# Patient Record
Sex: Female | Born: 1975 | Race: White | Hispanic: No | Marital: Married | Smoking: Former smoker
Health system: Southern US, Community
[De-identification: ages and names within clinical notes are randomized; demographics above are authoritative.]

## PROBLEM LIST (undated history)

## (undated) DIAGNOSIS — R200 Anesthesia of skin: Secondary | ICD-10-CM

## (undated) DIAGNOSIS — K2970 Gastritis, unspecified, without bleeding: Secondary | ICD-10-CM

## (undated) DIAGNOSIS — T7840XA Allergy, unspecified, initial encounter: Secondary | ICD-10-CM

## (undated) DIAGNOSIS — K900 Celiac disease: Secondary | ICD-10-CM

## (undated) DIAGNOSIS — C4491 Basal cell carcinoma of skin, unspecified: Secondary | ICD-10-CM

## (undated) DIAGNOSIS — K30 Functional dyspepsia: Secondary | ICD-10-CM

## (undated) DIAGNOSIS — E079 Disorder of thyroid, unspecified: Secondary | ICD-10-CM

## (undated) DIAGNOSIS — G610 Guillain-Barre syndrome: Secondary | ICD-10-CM

## (undated) HISTORY — PX: ESOPHAGOGASTRODUODENOSCOPY: SHX1529

## (undated) HISTORY — PX: WISDOM TOOTH EXTRACTION: SHX21

## (undated) HISTORY — PX: SKIN CANCER EXCISION: SHX779

## (undated) HISTORY — DX: Guillain-Barre syndrome: G61.0

## (undated) HISTORY — DX: Functional dyspepsia: K30

## (undated) HISTORY — DX: Gastritis, unspecified, without bleeding: K29.70

## (undated) HISTORY — DX: Celiac disease: K90.0

## (undated) HISTORY — DX: Anesthesia of skin: R20.0

## (undated) HISTORY — DX: Allergy, unspecified, initial encounter: T78.40XA

## (undated) HISTORY — DX: Basal cell carcinoma of skin, unspecified: C44.91

## (undated) HISTORY — DX: Disorder of thyroid, unspecified: E07.9

---

## 2001-01-31 ENCOUNTER — Other Ambulatory Visit: Admission: RE | Admit: 2001-01-31 | Discharge: 2001-01-31 | Payer: Self-pay | Admitting: Obstetrics & Gynecology

## 2002-02-22 ENCOUNTER — Other Ambulatory Visit: Admission: RE | Admit: 2002-02-22 | Discharge: 2002-02-22 | Payer: Self-pay | Admitting: Obstetrics & Gynecology

## 2003-02-26 ENCOUNTER — Other Ambulatory Visit: Admission: RE | Admit: 2003-02-26 | Discharge: 2003-02-26 | Payer: Self-pay | Admitting: Obstetrics & Gynecology

## 2004-03-10 ENCOUNTER — Inpatient Hospital Stay (HOSPITAL_COMMUNITY): Admission: AD | Admit: 2004-03-10 | Discharge: 2004-03-13 | Payer: Self-pay | Admitting: Obstetrics and Gynecology

## 2004-03-10 ENCOUNTER — Inpatient Hospital Stay (HOSPITAL_COMMUNITY): Admission: AD | Admit: 2004-03-10 | Discharge: 2004-03-10 | Payer: Self-pay | Admitting: Obstetrics & Gynecology

## 2004-04-22 ENCOUNTER — Encounter: Admission: RE | Admit: 2004-04-22 | Discharge: 2004-05-22 | Payer: Self-pay | Admitting: Obstetrics & Gynecology

## 2004-06-26 ENCOUNTER — Encounter: Admission: RE | Admit: 2004-06-26 | Discharge: 2004-06-26 | Payer: Self-pay | Admitting: Family Medicine

## 2006-04-05 ENCOUNTER — Emergency Department (HOSPITAL_COMMUNITY): Admission: EM | Admit: 2006-04-05 | Discharge: 2006-04-05 | Payer: Self-pay | Admitting: Emergency Medicine

## 2006-04-09 ENCOUNTER — Ambulatory Visit: Payer: Self-pay | Admitting: Family Medicine

## 2006-04-27 ENCOUNTER — Ambulatory Visit: Payer: Self-pay | Admitting: Cardiovascular Disease

## 2006-04-28 ENCOUNTER — Encounter: Admission: RE | Admit: 2006-04-28 | Discharge: 2006-07-27 | Payer: Self-pay | Admitting: Family Medicine

## 2006-05-05 ENCOUNTER — Ambulatory Visit: Payer: Self-pay | Admitting: Family Medicine

## 2006-06-02 ENCOUNTER — Ambulatory Visit: Payer: Self-pay | Admitting: Internal Medicine

## 2006-10-27 ENCOUNTER — Inpatient Hospital Stay (HOSPITAL_COMMUNITY): Admission: AD | Admit: 2006-10-27 | Discharge: 2006-10-29 | Payer: Self-pay | Admitting: *Deleted

## 2007-02-17 DIAGNOSIS — K900 Celiac disease: Secondary | ICD-10-CM

## 2007-02-17 HISTORY — DX: Celiac disease: K90.0

## 2007-04-01 ENCOUNTER — Ambulatory Visit: Payer: Self-pay | Admitting: Family Medicine

## 2007-04-01 ENCOUNTER — Encounter (INDEPENDENT_AMBULATORY_CARE_PROVIDER_SITE_OTHER): Payer: Self-pay | Admitting: *Deleted

## 2007-04-01 DIAGNOSIS — G610 Guillain-Barre syndrome: Secondary | ICD-10-CM

## 2007-04-01 DIAGNOSIS — E0590 Thyrotoxicosis, unspecified without thyrotoxic crisis or storm: Secondary | ICD-10-CM | POA: Insufficient documentation

## 2007-04-01 DIAGNOSIS — Z8669 Personal history of other diseases of the nervous system and sense organs: Secondary | ICD-10-CM

## 2007-04-01 HISTORY — DX: Thyrotoxicosis, unspecified without thyrotoxic crisis or storm: E05.90

## 2007-04-01 HISTORY — DX: Guillain-Barre syndrome: G61.0

## 2007-04-01 LAB — CONVERTED CEMR LAB
ALT: 29 units/L (ref 0–35)
Alkaline Phosphatase: 47 units/L (ref 39–117)
BUN: 9 mg/dL (ref 6–23)
Basophils Absolute: 0.1 10*3/uL (ref 0.0–0.1)
Basophils Relative: 0.9 % (ref 0.0–1.0)
Bilirubin, Direct: 0.1 mg/dL (ref 0.0–0.3)
CO2: 28 meq/L (ref 19–32)
Chloride: 106 meq/L (ref 96–112)
Eosinophils Absolute: 0 10*3/uL (ref 0.0–0.6)
Eosinophils Relative: 0.8 % (ref 0.0–5.0)
Free T4: 0.7 ng/dL (ref 0.6–1.6)
Glucose, Bld: 63 mg/dL — ABNORMAL LOW (ref 70–99)
HCT: 43.5 % (ref 36.0–46.0)
Lymphocytes Relative: 39.2 % (ref 12.0–46.0)
Monocytes Relative: 5.9 % (ref 3.0–11.0)
Platelets: 315 10*3/uL (ref 150–400)
RDW: 12.3 % (ref 11.5–14.6)
T3, Free: 2.9 pg/mL (ref 2.3–4.2)
TSH: 2.38 microintl units/mL (ref 0.35–5.50)
WBC: 5.8 10*3/uL (ref 4.5–10.5)

## 2007-04-04 ENCOUNTER — Encounter (INDEPENDENT_AMBULATORY_CARE_PROVIDER_SITE_OTHER): Payer: Self-pay | Admitting: Family Medicine

## 2007-04-05 ENCOUNTER — Telehealth (INDEPENDENT_AMBULATORY_CARE_PROVIDER_SITE_OTHER): Payer: Self-pay | Admitting: *Deleted

## 2007-04-06 ENCOUNTER — Encounter (INDEPENDENT_AMBULATORY_CARE_PROVIDER_SITE_OTHER): Payer: Self-pay | Admitting: *Deleted

## 2007-04-07 ENCOUNTER — Encounter (INDEPENDENT_AMBULATORY_CARE_PROVIDER_SITE_OTHER): Payer: Self-pay | Admitting: *Deleted

## 2007-04-28 ENCOUNTER — Ambulatory Visit: Payer: Self-pay | Admitting: Internal Medicine

## 2007-04-28 LAB — CONVERTED CEMR LAB: IgA: 239 mg/dL (ref 68–378)

## 2007-05-12 ENCOUNTER — Encounter: Payer: Self-pay | Admitting: Internal Medicine

## 2007-05-12 ENCOUNTER — Ambulatory Visit: Payer: Self-pay | Admitting: Internal Medicine

## 2007-05-12 ENCOUNTER — Encounter: Payer: Self-pay | Admitting: Family Medicine

## 2007-05-12 HISTORY — PX: COLONOSCOPY: SHX174

## 2007-10-26 DIAGNOSIS — K900 Celiac disease: Secondary | ICD-10-CM

## 2007-10-26 DIAGNOSIS — E162 Hypoglycemia, unspecified: Secondary | ICD-10-CM

## 2007-10-26 HISTORY — DX: Hypoglycemia, unspecified: E16.2

## 2007-10-27 ENCOUNTER — Ambulatory Visit: Payer: Self-pay | Admitting: Internal Medicine

## 2007-10-28 LAB — CONVERTED CEMR LAB
Albumin: 4.3 g/dL (ref 3.5–5.2)
Basophils Absolute: 0.1 10*3/uL (ref 0.0–0.1)
Basophils Relative: 1.1 % (ref 0.0–3.0)
CO2: 27 meq/L (ref 19–32)
Chloride: 102 meq/L (ref 96–112)
Eosinophils Absolute: 0.1 10*3/uL (ref 0.0–0.7)
Eosinophils Relative: 0.8 % (ref 0.0–5.0)
GFR calc Af Amer: 126 mL/min
HCT: 39 % (ref 36.0–46.0)
Lymphocytes Relative: 32.8 % (ref 12.0–46.0)
Monocytes Absolute: 0.5 10*3/uL (ref 0.1–1.0)
Monocytes Relative: 6.5 % (ref 3.0–12.0)
Platelets: 391 10*3/uL (ref 150–400)
Potassium: 4 meq/L (ref 3.5–5.1)
RDW: 12.3 % (ref 11.5–14.6)
Total Bilirubin: 0.7 mg/dL (ref 0.3–1.2)
WBC: 8.3 10*3/uL (ref 4.5–10.5)

## 2008-04-25 ENCOUNTER — Ambulatory Visit: Payer: Self-pay | Admitting: Internal Medicine

## 2008-05-03 LAB — CONVERTED CEMR LAB
ALT: 13 units/L (ref 0–35)
Basophils Absolute: 0.1 10*3/uL (ref 0.0–0.1)
CO2: 30 meq/L (ref 19–32)
Calcium: 9.4 mg/dL (ref 8.4–10.5)
Chloride: 106 meq/L (ref 96–112)
Eosinophils Absolute: 0.1 10*3/uL (ref 0.0–0.7)
Ferritin: 11.1 ng/mL (ref 10.0–291.0)
Folate: >20 ng/mL
GFR calc Af Amer: 126 mL/min
GFR calc non Af Amer: 104 mL/min
HCT: 40.9 % (ref 36.0–46.0)
Hemoglobin: 13.8 g/dL (ref 12.0–15.0)
Lymphocytes Relative: 37.2 % (ref 12.0–46.0)
Monocytes Absolute: 0.3 10*3/uL (ref 0.1–1.0)
Phosphorus: 3 mg/dL (ref 2.3–4.6)
Potassium: 4.3 meq/L (ref 3.5–5.1)
RBC: 4.6 M/uL (ref 3.87–5.11)
Sodium: 141 meq/L (ref 135–145)
TSH: 1.89 microintl units/mL (ref 0.35–5.50)
Vitamin B-12: 710 pg/mL (ref 211–911)

## 2008-05-11 ENCOUNTER — Encounter: Payer: Self-pay | Admitting: Internal Medicine

## 2008-05-11 ENCOUNTER — Ambulatory Visit: Payer: Self-pay | Admitting: Family Medicine

## 2008-05-21 LAB — CONVERTED CEMR LAB: Vit D, 25-Hydroxy: 41 ng/mL (ref 30–89)

## 2008-05-25 ENCOUNTER — Encounter: Payer: Self-pay | Admitting: Internal Medicine

## 2008-05-25 ENCOUNTER — Ambulatory Visit: Payer: Self-pay | Admitting: Internal Medicine

## 2008-06-05 ENCOUNTER — Ambulatory Visit: Payer: Self-pay | Admitting: Family Medicine

## 2008-06-05 DIAGNOSIS — J020 Streptococcal pharyngitis: Secondary | ICD-10-CM

## 2009-01-30 LAB — CONVERTED CEMR LAB: Pap Smear: NORMAL

## 2009-02-11 ENCOUNTER — Encounter: Payer: Self-pay | Admitting: Family Medicine

## 2009-02-11 LAB — CONVERTED CEMR LAB: HDL: 66 mg/dL

## 2009-05-22 ENCOUNTER — Ambulatory Visit: Payer: Self-pay | Admitting: Family Medicine

## 2009-05-22 DIAGNOSIS — R142 Eructation: Secondary | ICD-10-CM

## 2009-05-22 DIAGNOSIS — F438 Other reactions to severe stress: Secondary | ICD-10-CM

## 2009-05-22 DIAGNOSIS — R143 Flatulence: Secondary | ICD-10-CM

## 2009-05-22 DIAGNOSIS — R141 Gas pain: Secondary | ICD-10-CM | POA: Insufficient documentation

## 2009-05-24 ENCOUNTER — Telehealth (INDEPENDENT_AMBULATORY_CARE_PROVIDER_SITE_OTHER): Payer: Self-pay | Admitting: *Deleted

## 2009-05-24 LAB — CONVERTED CEMR LAB
AST: 23 units/L (ref 0–37)
Albumin: 4.4 g/dL (ref 3.5–5.2)
Alkaline Phosphatase: 42 units/L (ref 39–117)
BUN: 9 mg/dL (ref 6–23)
Basophils Relative: 1.3 % (ref 0.0–3.0)
Bilirubin, Direct: 0.1 mg/dL (ref 0.0–0.3)
Hemoglobin: 14.4 g/dL (ref 12.0–15.0)
Lymphs Abs: 1.4 10*3/uL (ref 0.7–4.0)
MCHC: 34.8 g/dL (ref 30.0–36.0)
Monocytes Absolute: 0.3 10*3/uL (ref 0.1–1.0)
Neutro Abs: 1.9 10*3/uL (ref 1.4–7.7)
Neutrophils Relative %: 51.7 % (ref 43.0–77.0)
Platelets: 262 10*3/uL (ref 150.0–400.0)
RBC: 4.59 M/uL (ref 3.87–5.11)
Total Bilirubin: 0.6 mg/dL (ref 0.3–1.2)
WBC: 3.7 10*3/uL — ABNORMAL LOW (ref 4.5–10.5)

## 2009-05-27 ENCOUNTER — Encounter: Payer: Self-pay | Admitting: Internal Medicine

## 2009-05-27 LAB — CONVERTED CEMR LAB
IgE (Immunoglobulin E), Serum: 9.8 intl units/mL (ref 0.0–180.0)
Tissue Transglutaminase Ab, IgA: 0.9 units (ref <7)

## 2009-05-29 ENCOUNTER — Ambulatory Visit: Payer: Self-pay | Admitting: *Deleted

## 2009-06-05 ENCOUNTER — Ambulatory Visit: Payer: Self-pay | Admitting: *Deleted

## 2009-06-28 ENCOUNTER — Encounter: Payer: Self-pay | Admitting: Family Medicine

## 2009-06-28 ENCOUNTER — Ambulatory Visit: Payer: Self-pay | Admitting: *Deleted

## 2009-07-05 ENCOUNTER — Encounter (INDEPENDENT_AMBULATORY_CARE_PROVIDER_SITE_OTHER): Payer: Self-pay | Admitting: *Deleted

## 2009-07-19 ENCOUNTER — Ambulatory Visit: Payer: Self-pay | Admitting: *Deleted

## 2009-08-22 ENCOUNTER — Ambulatory Visit: Payer: Self-pay | Admitting: Internal Medicine

## 2009-09-27 ENCOUNTER — Telehealth: Payer: Self-pay | Admitting: Internal Medicine

## 2010-03-18 NOTE — Letter (Signed)
Summary: Letter to Patient Regarding Lab Results/[facility]  Letter to Patient Regarding Lab Results/[facility]   Imported By: Edmonia James 05/27/2009 08:34:44  _____________________________________________________________________  External Attachment:    Type:   Image     Comment:   External Document

## 2010-03-18 NOTE — Progress Notes (Signed)
Summary: Lab results   Phone Note Outgoing Call   Call placed by: Allyn Kenner CMA,  May 24, 2009 4:44 PM Summary of Call: Regarding lab results, LMTCB:  allergic to [facility] grass  celiac test normal Signed by [name] DO on 05/24/2009 at 9:39 AM  Follow-up for Phone Call        [facility]. Allyn Kenner CMA  May 27, 2009 11:59 AM   Additional Follow-up for Phone Call Additional follow up Details #1::        Pt is aware. [place]  May 27, 2009 2:09 PM

## 2010-03-18 NOTE — Assessment & Plan Note (Signed)
Summary: REC OV...AS.    History of Present Illness Visit Type: Follow-up Visit Primary GI MD: Silvano Rusk MD [facility] Primary Provider: [name] DO Requesting Provider: n/a Chief Complaint: bloating & gas History of Present Illness:   35 yo woman with celiac disease diagnosed in 2009. TTG Ab was 30. She has been gluten-free with normal TTG Ab leveles since. [place], despite resolution of diarrhea, still bothered by gas, some bloating and borborygmi. She took a trip to [place] and was not bothered while there. She ?'s if food intolerance. Has reduced milk and probably a little better. Feels gas bubbles movng through, not painful. Slight sensation of swelling.    GI Review of Systems    Reports bloating.      Denies abdominal pain, acid reflux, belching, chest pain, dysphagia with liquids, dysphagia with solids, heartburn, loss of appetite, nausea, vomiting, vomiting blood, weight loss, and  weight gain.        Denies anal fissure, black tarry stools, change in bowel habit, constipation, diarrhea, diverticulosis, fecal incontinence, heme positive stool, hemorrhoids, irritable bowel syndrome, jaundice, light color stool, liver problems, rectal bleeding, and  rectal pain. Clinical Reports Reviewed:  Colonoscopy:  05/12/2007:  1) SMALL EXTERNAL HEMORRHOIDS 2) OTHERWSIE NORMAL EXAM INTO TERMINAL ILEUM 3) RANDOM COLON BIOPSIES TO LOOK FOR MICROSCOPIC COLITIS 4) EXCELLENT PREP  COLON, BIOPSY:  BENIGN COLONIC MUCOSA.  NO SIGNIFICANT INFLAMMATION OR OTHER ABNORMALITIES IDENTIFIED.     Upper Endoscopy:  05/12/2007:  1) MILD FISSURING AT TOP OF DUODENAL FOLDS WHICH COULD BE A SIGN OF CELIAC DISEASE. BIOPSIES TAKEN. 2) OTHERWISE NORMAL EGD  1.  DUODENUM, BIOPSY:  FINDINGS CONSISTENT WITH SUBTOTAL VILLOUS ATROPHY AND ASSOCIATED INCREASED INTRAEPITHELIAL LYMPHOCYTES.      Current Medications (verified): 1)  Tylenol 325 Mg Tabs (Acetaminophen) .... As Needed 2)  Claritin 10 Mg Tabs  (Loratadine) .... As Needed 3)  Iron .Marland KitchenMarland Kitchen. 1 A Day 4)  Vitamin D .... 1 A Day 5)  Calcium .Marland KitchenMarland Kitchen. 1 A Day 6)  Vitamin A .Marland Kitchen.. 1 A Day 7)  Epiduo .Marland Kitchen.. 1 Two Times A Day For Acne 8)  Tagamet Hb 200 Mg Tabs (Cimetidine) .... Two Times A Day  Allergies (verified): 1)  ! Fluvirin (Influenza Vac Typ A&b Surf Ant)  Past History:  Past Medical History: Reviewed history from 10/27/2007 and no changes required. Hyperthyroidism- post-partum Seasonal Allergies Celiac sprue  Past Surgical History: Reviewed history from 10/27/2007 and no changes required. Cesarean section  Family History: Reviewed history from 10/27/2007 and no changes required. mother-leukemia father-prostate cancer siblings-HTN No FH of Colon Cancer:  Social History: Reviewed history from 10/27/2007 and no changes required. 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  Alcohol Use - yes -occ. Daily Caffeine Use -1 Illicit Drug Use - no Patient does not get regular exercise.   Vital Signs:  Patient profile:   35 year old female Height:      66 inches Weight:      131 pounds BMI:     21.22 Pulse rate:   64 / minute Pulse rhythm:   regular BP sitting:   108 / 70  (left arm) Cuff size:   regular  Vitals Entered By: June McMurray [place] Deborra Medina) (August 22, 2009 3:58 PM)  Physical Exam  General:  Well developed, well nourished, no acute distress.   Impression & Recommendations:  Problem # 1:  FLATULENCE ERUCTATION AND GAS PAIN (ICD-787.3) Assessment New She noticed it  as a problems when she traveled to [place] in 12/2008. Her celiac disease seems to be controlled by TTG Ab levels so ? IBS, food intolerance, small intestinal bacterial overgrowth? Will try to elimnate fructose and see what that does. Will see what I can find out about celaiac disease and whether we should repeat an EGD despite normal TTG Ab.  It seesm to me (and her) that something was not in  diet when she was in [place], making the possibility of celiac disease as the problem less so.  Problem # 2:  CELIAC SPRUE (ICD-579.0) Assessment: Unchanged TTG Ab's ok has not had a repeat EGD to see if histologic resolution achieved ? if she needs will investigate  Patient Instructions: 1)  Follow the instructions to eliminate fructose in your diet. 2)  Let us know if this helps after 4 weeks have passed. 3)  Dr. Carlean Purl will also investigate the possibility that celiac disease is still a problem despite normal antibody levels and let you know. 4)  The medication list was reviewed and reconciled.  All changed / newly prescribed medications were explained.  A complete medication list was provided to the patient / caregiver.

## 2010-03-18 NOTE — Assessment & Plan Note (Signed)
Summary: CPX AND FASTING LABS///[facility]   Vital Signs:  Patient profile:   35 year old female Height:      66 inches Weight:      128.50 pounds BMI:     20.82 Pulse rate:   68 / minute BP sitting:   100 / 78  Vitals Entered By: Verdie Mosher (May 22, 2009 8:32 AM) CC: cpx without pap Comments pt had pap and lipid labs at gyn office pt bought in labs   History of Present Illness: Pt here for cpe and labs.  Pt sees Dr Garwin Brothers for gyn exam and Dr Carlean Purl for Celiac dz.  Pt thinks she may also be allergic to milk--- lactose intolerant---she has alot of gas and bloating.  When she went to [place] she had no issues.   Pt husband also complains pt is getting angry easily and has a lot of stress.  Pt is asking to see a counselor and prefers not to be put on meds at this time.    Preventive Screening-Counseling & Management  Alcohol-Tobacco     Alcohol drinks/day: <1     Alcohol type: wine     Smoking Status: quit     Smoke Cessation Stage: quit     Packs/Day: <0.25     Year Started: 1994     Year Quit: 2000  Caffeine-Diet-Exercise     Caffeine use/day: 2     Does Patient Exercise: yes     Type of exercise: gym     Times/week: 3     Exercise Counseling: not indicated; exercise is adequate  Hep-HIV-STD-Contraception     Dental Visit-last 6 months yes     Dental Care Counseling: not indicated; dental care within six months     SBE monthly: no     SBE Education/Counseling: to perform regular SBE      Sexual History:  currently monogamous and married with 2 children.    Current Medications (verified): 1)  Tylenol 325 Mg Tabs (Acetaminophen) .... As Needed 2)  Claritin 10 Mg Tabs (Loratadine) .... As Needed 3)  Iron .Marland KitchenMarland Kitchen. 1 A Day 4)  Vitamin D .... 1 A Day 5)  Calcium .Marland KitchenMarland Kitchen. 1 A Day 6)  Vitamin A .Marland Kitchen.. 1 A Day 7)  Minocycline .Marland Kitchen.. 1 Bid 8)  Epiduo .Marland Kitchen.. 1 Two Times A Day For Acne  Allergies (verified): 1)  ! 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  Alcohol Use - yes -occ. Daily Caffeine Use -1 Illicit Drug Use - no Patient does not get regular exercise.   Risk Factors: Alcohol Use: <1 (05/22/2009) Caffeine Use: 2 (05/22/2009) Exercise: yes (05/22/2009)  Risk Factors: Smoking Status: quit (05/22/2009) Packs/Day: <0.25 (05/22/2009)  Family History: Reviewed history from 10/27/2007 and no changes required. mother-leukemia father-prostate cancer siblings-HTN No FH of Colon Cancer:  Social History: Reviewed history from 10/27/2007 and no changes required. 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  Alcohol Use - yes -occ. Daily Caffeine Use -1 Illicit Drug Use - no Patient does not get regular exercise.  Packs/Day:  <0.25 Caffeine use/day:  2  Does Patient Exercise:  yes Dental Care w/in 6 mos.:  yes Sexual History:  currently monogamous, married with 2 children   Review of Systems      See HPI General:  Denies chills, fatigue, fever, loss of appetite, malaise, sleep disorder, sweats, weakness, and weight loss. Eyes:  Denies blurring, discharge, double vision, eye irritation, eye pain, halos, itching, light sensitivity, red eye, vision loss-1 eye, and vision loss-both eyes; optho-q2y. ENT:  Denies decreased hearing, difficulty swallowing, ear discharge, earache, hoarseness, nasal congestion, nosebleeds, postnasal drainage, ringing in ears, sinus pressure, and sore throat. CV:  Denies bluish discoloration of lips or nails, chest  pain or discomfort, difficulty breathing at night, difficulty breathing while lying down, fainting, fatigue, leg cramps with exertion, lightheadness, near fainting, palpitations, shortness of breath with exertion, swelling of feet, swelling of hands, and weight gain. Resp:  Denies chest discomfort, chest pain with inspiration, cough, coughing up blood, excessive snoring, hypersomnolence, morning headaches, pleuritic, shortness of breath, sputum productive, and wheezing. GI:  Denies abdominal pain, bloody stools, change in bowel habits, constipation, dark tarry stools, diarrhea, excessive appetite, gas, hemorrhoids, indigestion, loss of appetite, nausea, vomiting, vomiting blood, and yellowish skin color. GU:  Denies abnormal vaginal bleeding, decreased libido, discharge, dysuria, genital sores, hematuria, incontinence, nocturia, urinary frequency, and urinary hesitancy. MS:  Denies joint pain, joint redness, joint swelling, loss of strength, low back pain, mid back pain, muscle aches, muscle , cramps, muscle weakness, stiffness, and thoracic pain. Derm:  Denies changes in color of skin, changes in nail beds, dryness, excessive perspiration, flushing, hair loss, insect bite(s), itching, lesion(s), poor wound healing, and rash. Neuro:  Denies brief paralysis, difficulty with concentration, disturbances in coordination, falling down, headaches, inability to speak, memory loss, numbness, poor balance, seizures, sensation of room spinning, tingling, tremors, visual disturbances, and weakness. Psych:  Denies alternate hallucination ( auditory/visual), anxiety, depression, easily angered, easily tearful, irritability, mental problems, panic attacks, sense of great danger, suicidal thoughts/plans, thoughts of violence, unusual visions or sounds, and thoughts /plans of harming others. Endo:  Denies cold intolerance, excessive hunger, excessive thirst, excessive urination, heat intolerance, polyuria, and weight  change. Heme:  Denies abnormal bruising, bleeding, enlarge lymph nodes, fevers, pallor, and skin discoloration. Allergy:  Denies hives or rash, itching eyes, persistent infections, seasonal allergies, and sneezing.  Physical Exam  General:  Well-developed,well-nourished,in no acute distress; alert,appropriate and cooperative throughout examination Head:  Normocephalic and atraumatic without obvious abnormalities. No apparent alopecia or balding. Eyes:  vision grossly intact, pupils equal, pupils round, pupils reactive to light, and no injection.   Ears:  External ear exam shows no significant lesions or deformities.  Otoscopic examination reveals clear canals, tympanic membranes are intact bilaterally without bulging, retraction, inflammation or discharge. Hearing is grossly normal bilaterally. Nose:  External nasal examination shows no deformity or inflammation. Nasal mucosa are pink and moist without lesions or exudates. Mouth:  Oral mucosa and oropharynx without lesions or exudates.  Teeth in good repair. Neck:  No deformities, masses, or tenderness noted. Breasts:  gyn Lungs:  Normal respiratory effort, chest expands symmetrically. Lungs are clear to auscultation, no crackles or wheezes. Heart:  normal rate and no murmur.   Abdomen:  Bowel sounds positive,abdomen soft and non-tender without masses, organomegaly or hernias noted. Genitalia:  gyn Msk:  normal ROM, no joint tenderness, no joint swelling, no joint warmth, no redness over joints, no joint deformities, no joint instability, and no crepitation.   Pulses:  R and L carotid,radial,femoral,dorsalis pedis and  posterior tibial pulses are full and equal bilaterally Extremities:  No clubbing, cyanosis, edema, or deformity noted with normal full range of motion of all joints.   Neurologic:  No cranial nerve deficits noted. Station and gait are normal. Plantar reflexes are down-going bilaterally. DTRs are symmetrical throughout. Sensory,  motor and coordinative functions appear intact. Skin:  Intact without suspicious lesions or rashes Cervical Nodes:  No lymphadenopathy noted Axillary Nodes:  No palpable lymphadenopathy Psych:  Cognition and judgment appear intact. Alert and cooperative with normal attention span and concentration. No apparent delusions, illusions, hallucinations   Impression & Recommendations:  Problem # 1:  [place] (ICD-V70.0) ghm utd  Orders: Venipuncture (95621) TLB-BMP (Basic Metabolic Panel-BMET) (30865-HQIONGE) TLB-CBC Platelet - w/Differential (85025-CBCD) TLB-Hepatic/Liver Function Pnl (80076-HEPATIC) TLB-TSH (Thyroid Stimulating Hormone) (84443-TSH) TLB-T3, Free (Triiodothyronine) (84481-T3FREE) TLB-T4 (Thyrox), Free [phone]) T- * Misc. Laboratory test [phone])  Problem # 2:  FLATULENCE ERUCTATION AND GAS PAIN (ICD-787.3)  Orders: T-Food Allergy Profile Specific IgE (86003/[id number]) T-Allergy Profile Region II-DC, [place], [place], [place], [place] [phone])  Problem # 3:  CELIAC SPRUE (ICD-579.0)  Orders: Venipuncture (66440) TLB-BMP (Basic Metabolic Panel-BMET) (34742-VZDGLOV) TLB-CBC Platelet - w/Differential (85025-CBCD) TLB-Hepatic/Liver Function Pnl (80076-HEPATIC) TLB-TSH (Thyroid Stimulating Hormone) (84443-TSH) TLB-T3, Free (Triiodothyronine) (84481-T3FREE) TLB-T4 (Thyrox), Free [phone]) T- * Misc. Laboratory test [phone])  Problem # 4:  OTHER ACUTE REACTIONS TO STRESS (ICD-308.3)  Orders: Psychology Referral (Psychology)  Complete Medication List: 1)  Tylenol 325 Mg Tabs (Acetaminophen) .... As needed 2)  Claritin 10 Mg Tabs (Loratadine) .... As needed 3)  Iron  .Marland Kitchen.. 1 a day 4)  Vitamin D  .... 1 a day 5)  Calcium  .Marland Kitchen.. 1 a day 6)  Vitamin A  .Marland Kitchen.. 1 a day 7)  Minocycline  .Marland KitchenMarland Kitchen. 1 bid 8)  Epiduo  .Marland Kitchen.. 1 two times a day for acne  Other Orders: Pneumococcal Vaccine [phone]) Admin 1st Vaccine [phone]) Admin 1st Vaccine [facility]) [phone]) Tdap => 75yr IM  ((73220 Admin of Any Addtl Vaccine ((25427 Admin of Any Addtl Vaccine (State) ([phone]     Flu Vaccine Next Due:  Not Indicated HDL Result Date:  02/11/2009 HDL Result:  66 HDL Next Due:  1 yr LDL Result Date:  02/11/2009 LDL Result:  76 LDL Next Due:  1 yr PAP Result Date:  01/30/2009 PAP Result:  normal PAP Next Due:  1 yr   Orders Added: 1)  Venipuncture [36415] 2)  TLB-BMP (Basic Metabolic Panel-BMET) [[28315-VVOHYWV]3)  TLB-CBC Platelet - w/Differential [85025-CBCD] 4)  TLB-Hepatic/Liver Function Pnl [80076-HEPATIC] 5)  TLB-TSH (Thyroid Stimulating Hormone) [84443-TSH] 6)  TLB-T3, Free (Triiodothyronine) [[37106-Y6RSWN]7)  TLB-T4 (Thyrox), Free [[46270-JJ0K]8)  T- * Misc. Laboratory test [[93818] 2  T-Food Allergy Profile Specific IgE [86003/[address]] 10)  T-Allergy Profile Region [place], [place], [place], [place], [place][5484] 11)  Psychology Referral [Psychology] 12)  Pneumococcal Vaccine [90732] 13)  Admin 1st Vaccine [90471] 14)  Admin 1st Vaccine ([facility] [90471S] 15)  Tdap => 774yrIM [9[99371]6)  Admin of Any Addtl Vaccine [90472] 17)  Admin of Any Addtl Vaccine (State) [9[69678L]8)  Est. Patient 18-39 years [99395]   Tetanus/Td Vaccine    Vaccine Type: Tdap    Site: left deltoid    Mfr: GlaxoSmithKline    Dose: 0.5 ml    Route: IM    Given by: AlVerdie Mosher  Exp. Date: 05/11/2011    Lot #: [id number]  VIS given: 01/04/07 version given May 22, 2009.  Pneumovax  Vaccine    Vaccine Type: Pneumovax    Site: right deltoid    Mfr: Merck    Dose: 0.5 ml    Route: IM    Given by: Verdie Mosher    Exp. Date: 01/25/2010    Lot #: [id number]    VIS given: 09/14/95 version given May 22, 2009.

## 2010-03-18 NOTE — Consult Note (Signed)
Summary: [facility]   Imported By: Edmonia James 07/04/2009 12:53:56  _____________________________________________________________________  External Attachment:    Type:   Image     Comment:   External Document

## 2010-03-18 NOTE — Progress Notes (Signed)
Summary: Condition Update   Phone Note Outgoing Call   Summary of Call: please call her to see if fructose elimination helped her sxs See last REV note. Gatha Mayer MD, [facility]  September 27, 2009 9:57 AM   Follow-up for Phone Call        Message left for patient to callback. Vivia Ewing LPN  September 28, 1655 10:09 AM   Pt. states she hasn't been following the restriction diligently. "I am just gonna trial and error things, until I can figure out what is going on. I haven't noticed any big difference, but I haven't been following the diet very strictly."  Pt. instructed to call back as needed.  Follow-up by: Vivia Ewing LPN,  September 27, 9036 11:07 AM

## 2010-03-18 NOTE — Letter (Signed)
Summary: celiac test results notice  [facility]  [address] [place], [place] [address]   Phone: [phone]  Fax: [phone]    05/27/2009  [name] [name] [address] [place], [place]  [address]  Dear Ms. Sobolewski,  Your celiac antibody testing performed through your visit with Dr. Etter Sjogren was normal. This indicates that you are doing a good job avoiding gluten.  An appointment with me is not necessary at this time but please let me know if you are having gastrointestinal symptoms like abdominal pain or diarrhea.   Dr. Etter Sjogren can check your antibodies annually and as long as you are doing well then I would ask that you have a routine visit with me every 2-3 years for a GI check-up.    Sincerely,   Silvano Rusk MD, [facility]  Appended Document: celiac test results notice letter mailed to patient's home

## 2010-03-18 NOTE — Letter (Signed)
Summary: Office Visit Letter  [facility]  [address] [place], [place] [address]   Phone: [phone]  Fax: [phone]      Jul 05, 2009 MRN: [id number]   MAZIYAH VESSEL [address] [place], [place]  [address]   Dear Ms. Harkin,   According to our records, it is time for you to schedule a follow-up office visit with [place].   At your convenience, please call [phone] (option #2)to schedule an office visit. If you have any questions, concerns, or feel that this letter is in error, we would appreciate your call.   Sincerely,  Gatha Mayer, M.D.  [facility] [phone]

## 2010-07-01 NOTE — Assessment & Plan Note (Signed)
[facility]                         GASTROENTEROLOGY OFFICE NOTE   NAME:[name], [name] SALVI                   MRN:          [id number]  DATE:04/28/2007                            DOB:          [birth year]    CONSULTATION   CHIEF COMPLAINT:  Diarrhea, rectal bleeding.   PRIMARY CARE PHYSICIAN:  [name] Haven, MD.   ASSESSMENT:  A 35 year old, white woman, who has had problems with some  minor diarrhea-type symptoms off and on for a number of years.  However,  since [name] [name], about three months ago, she has had fairly frequent  diarrhea with mucus and some bright red blood with wiping.  Etiologies  at this point certainly include irritable bowel syndrome, but the  possibility of either ulcerative colitis, Crohn's colitis/ileitis,  microscopic colitis, or perhaps even celiac disease exists.  She has had  a thorough laboratory workup with Dr. Cletus Gash with negative CBC, CMET,  TSH is normal, and a stool for ova and parasites screen, C.diff toxin,  and culture have been negative.   RECOMMENDATIONS AND PLAN:  At this point, I am going to check a celiac  antibody profile as well as an IGA level.  We will do the tissue  transglutaminase antibody only for celiac.  We will also schedule a  colonoscopy with plans for random biopsies even if it looks normal.  Risks, benefits, and indications were explained to the patient, and she  understands and agrees to proceed.   CELIAC AB (TTG AB) WAS POSITIVE AND EGD WILL BE DONE ALSO   HISTORY:  As above.  This is a pleasant, 35 year old, white woman, who  has had some intermittent, mild diarrhea off and on for a number of  years.  However around December, without any recent travel or anything,  she developed fairly frequent diarrhea several times a day.  She has not  had any incontinence though there have been urgent bowel movements.  Sometimes she will see some bright red blood on the tissue and perhaps  on  the stool, or a few drops in the commode, which she attributes to  hemorrhoids.  She is about six months postpartum and believes she had  hemorrhoids with that.  There is no associated fever or chills, there is  some minor abdominal cramping, but not much.  The symptoms are not  nocturnal.  GI review of systems is otherwise unremarkable.   MEDICATIONS:  Prenatal's, Vitamin B6, Tylenol, there are no known drug  allergies.   PAST MEDICAL HISTORY:  1. Pregnancy x2, she is not breastfeeding anymore.  2. Thyroiditis postpartum in 2006 and 2007 with hyperthyroidism.  3. Seasonal allergies.   No surgeries.   FAMILY HISTORY:  Father had prostate cancer, and mother with leukemia,  no colon cancer or inflammatory bowel disease.   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  She uses  some alcohol, and no tobacco or drugs.   REVIEW OF SYSTEMS:  Night sweats after her pregnancy, otherwise  negative, last menstrual period February 20th, all other systems  are  negative at this time.   PHYSICAL EXAMINATION:  VITAL SIGNS:  Height 5 feet 5, weight 139 pounds,  blood pressure 110/72, pulse 64.  EYES:  Anicteric.  ENT:  Normal mouth and posterior pharynx.  NECK:  Supple, no thyromegaly or masses.  CHEST:  Clear.  HEART:  S1 and S2, no murmurs, rubs, or gallops.  ABDOMEN:  Soft and nontender without organomegaly or mass.  RECTAL EXAM:  Deferred.  Inspection, however, does show what looked to  be some mild hemorrhoids right at the anus though they are not really  protruding.  LYMPHATIC:  No neck, supraclavicular, or inguinal adenopathy.  LOWER EXTREMITIES:  Free of edema.  SKIN:  No rash.  PSYCH:  She is alert and oriented x3.  NEURO:  She has cranial nerves II-XII intact and grossly nonfocal.   I have reviewed the EMR records from Dr. Janeann [name] office visit, she  also had laboratory studies reviewed as  mentioned above.   I appreciate the opportunity to care for this patient.     [name] Mayer, MD,FACG  Electronically Signed    CEG/MedQ  DD: 04/28/2007  DT: 04/28/2007  Job #: [id number]   cc:   [name] [name], M.D.

## 2010-07-04 NOTE — Letter (Signed)
April 27, 2006    Leone Haven, M.D.  [phone], [place]   RE:  [name] [name], [name] [name]  MRN:  [id number]  /  DOB:  [birth year]   Dear Dr. Cletus Gash,   It was my pleasure to see [name] [name] as an outpatient at the  [facility] clinic on April 27, 2006. As you know, she is a 35-  year-old woman who is currently at approximately [redacted] weeks gestation in  her second pregnancy. She presents with a chief complaint of syncope.   [name] [name] had an episode approximately 2 weeks ago when she was taking  a hot shower and had been in the shower for 5-10 minutes. She developed  generalized weakness followed by vision loss and finally she passed out.  She has had similar episodes in the past and has been able to ease  herself down to the ground but this time she had shampoo in her hair and  tried to finish rinsing it out. She was able to turn the water off  before falling. She does not remember anything after turning the water  off and states that she woke up with her husband standing over her.  There was no period of confusion following this episode. She describes a  similar episode approximately 1 week earlier where she became  presyncopal but her symptoms abated prior to passing out.   [name] [name] has a longstanding history of syncope. She reports about 15  episodes in her lifetime. These date back to her earlier childhood. They  have occurred after injuries or events such as looking at her own blood  after a cut or injury. She has never had any serious injury resulting  from syncope. She has never had exertional syncope.   During her pregnancy she has had some nausea and a few episodes of  vomiting, but overall is doing well at present. She describes poor p.o.  intake at the beginning of her pregnancy but this has now improved. She  has no chest pain, dyspnea, palpitations, orthopnea, PND or edema. Prior  to these 2 recent episodes, her next  most recent episode of syncope was  during her pregnancy in 2005.   PAST MEDICAL HISTORY:  1. 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  2. Prior C section.  3. Postpartum thyroiditis which was monitored and did not require      treatment.   SOCIAL HISTORY:  The patient is married with one child and as detailed  above is pregnant with her second child. 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 She has been  active in an intermittent basis but is currently not involved in any  regular exercise. She does not smoke cigarettes and was a former smoker  but quit in 1997. She does not use recreational drugs. She drinks  alcohol on rare occasion. She has 8-10 ounces of caffeine daily.   FAMILY HISTORY:  Her mother died at age 55 of leukemia. Her father is  alive and well at age 25. She has a sister who is 29 and has no medical  problems. There is no family history of sudden cardiac death.   CURRENT MEDICATIONS:  Prenatal vitamins and vitamin B6.   ALLERGIES:  NKDA.   REVIEW OF SYSTEMS:  A complete 12-point review of systems was performed.  Pertinent positives include seasonal allergies, constipation and  gastroesophageal reflux just with pregnancy.   PHYSICAL EXAMINATION:  GENERAL:  The patient is alert and oriented. She  is in no acute distress.  VITAL SIGNS:  Weight is 136 pounds. Blood pressure was 112/64 in the  right arm, 106/69 in the left arm, heart rate 71, respiratory rate is  12.  HEENT:  Normal.  NECK:  Normal carotid upstrokes without bruits. Jugular venous pressure  is normal. There is no thyromegaly or thyroid nodules.  LUNGS:  Clear to auscultation bilaterally.  HEART:  The apex is discreet and nondisplaced. There is no right  ventricular heave or lift. The heart is regular rate and rhythm without  murmurs or gallops.  ABDOMEN:  Soft, nontender, no organomegaly, no  abdominal bruits.  BACK:  There is no flank tenderness.  EXTREMITIES:  No clubbing, cyanosis or edema. Peripheral pulses are 2+  and equal throughout.  SKIN:  Warm and dry without rash.  NEUROLOGIC:  Grossly intact. Cranial nerves II-XII are intact. Strength  is 5/5 and equal in the arms and legs bilaterally.   EKG shows normal sinus rhythm and is within normal limits. QT interval  is normal with a corrected QT of 415 msec.   ASSESSMENT:  [name] [name] is a 35 year old woman in her first trimester  or pregnancy with syncope. Her history is classic for vasovagal syncope.  I suspect her most recent episode is related to vasodilatation from a  hot shower causing hypotension and a cascade of events leading to  vasovagal syncope.   This is a very typical history in a patient with longstanding recurrent  syncopal episodes who is otherwise healthy. Her episodes have been  increased with pregnancy and have otherwise occurred following an injury  or some other stimulus.   On examination, I do not see any evidence of structural heart disease. I  have recommended increasing both fluid and salt intake. This should  allow her to maintain adequate intravascular volume which should help  reduce the number of syncopal episodes during her pregnancy.   Dr. Cletus Gash, thanks very much for allowing me to see [name] [name].  Please feel free to call at anytime with questions regarding her care. I  will plan on seeing her back on an as needed basis and would be happy to  see her if she develops any further problems.    Sincerely,      Juanda Bond. Burt Knack, MD  Electronically Signed    MDC/MedQ  DD: 04/27/2006  DT: 04/27/2006  Job #: [id number]   CC:    Servando Salina, M.D.

## 2010-07-04 NOTE — Discharge Summary (Signed)
NAME:  FRADY, TADDEO            ACCOUNT NO.:  [id number]   MEDICAL RECORD NO.:  [id number]          PATIENT TYPE:  INP   LOCATION:  9122                          FACILITY:  [facility]   PHYSICIAN:  Servando Salina, M.D.DATE OF BIRTH:  [birth year]   DATE OF ADMISSION:  03/10/2004  DATE OF DISCHARGE:  03/13/2004                                 DISCHARGE SUMMARY   ADMISSION DIAGNOSES:  Mild preeclampsia, frank breech presentation,  intrauterine gestation at 21 4/7 weeks.   DISCHARGE DIAGNOSES:  Mild preeclampsia, intrauterine gestation at 36 4/7  weeks delivered, frank breech presentation.   PROCEDURE:  Primary cesarean section.   HISTORY OF PRESENT ILLNESS:  A 35 year old, gravida 1, para 0, married white  female at 57 4/7 weeks admitted on March 10, 2004 with a breech  presentation and mild preeclampsia.  The patient had an uncomplicated  prenatal course until she presented on March 10, 2004 for routine  obstetrical care and was found to have an elevated blood pressure. She was  sent to maternity admissions for further evaluation including [place] labs. The  labs were notable for uric acid of 7.2, ALT of 41, AST of 51.  Ultrasound  done in labor and delivery confirmed that the patient remains in the breech  presentation. She had a reactive nonstress test and had some contractions  that were not perceived by the patient. Her exam was notable for her cervix  being a centimeter dilated in the office.   HOSPITAL COURSE:  The patient was admitted to [facility]. She was  taken to the operating room where she underwent a primary cesarean section  with resulting delivery of a live female from the frank breech position,  Apgar's of 9 and 9, placenta was spontaneously intact, normal tubes and  ovaries. The weight of the baby was 6 pounds, 12 ounces, there was copious  clear amniotic fluid noted at the time.  Postoperatively the patient was  placed on magnesium sulfate based on the  diagnosis of mild preeclampsia. She  was noted to be hypokalemic which was thought secondary to the  gastroenteritis that she had over the weekend prior to being admitted. Her  potassium was repleted.  She diuresed well on the magnesium sulfate which  was continued for 24 hours and subsequently the patient did very well.  Her  CBC on postoperative day #1 showed hemoglobin of 11.4, her hematocrit was  32.6, her platelets 250,000.  Repeat of her [place] labs revealed an ALT that  decreased down to 33.  Uric acid was 6.9. She was on a regular diet, passing  flatus, incision showed no erythema, induration or exudate. Her blood  pressures ranged between 130 to 134/87 - 90. By postoperative day #3, she  had no PIH symptoms and her incision showed no evidence of an infection and  she was deemed well to be discharged home.   DISPOSITION:  Home.   CONDITION ON DISCHARGE:  Stable.   FOLLOW UP:  Blood pressure check in one week in the office. Postpartum  appointment four weeks at [facility].   DISCHARGE MEDICATIONS:  1.  Tylox #  30, 1 p.o. q.4 hours p.r.n. pain.  2.  Prenatal vitamins 1 p.o. q.d.   DISCHARGE INSTRUCTIONS:  Per the postpartum booklet given.      [place]/MEDQ  D:  04/03/2004  T:  04/03/2004  Job:  [id number]

## 2010-07-04 NOTE — Op Note (Signed)
NAME:  [name] [name], [name] [name]            ACCOUNT NO.:  [id number]   MEDICAL RECORD NO.:  [id number]          PATIENT TYPE:  INP   LOCATION:  9126                          FACILITY:  [facility]   PHYSICIAN:  Servando Salina, M.D.DATE OF BIRTH:  1975/08/25   DATE OF PROCEDURE:  03/10/2004  DATE OF DISCHARGE:                                 OPERATIVE REPORT   PREOPERATIVE DIAGNOSIS:  Mild preeclampsia, frank breech presentation,  intrauterine gestation at 36-4/7 weeks.   PROCEDURE:  Primary cesarean section, Kerr hysterotomy.   POSTOPERATIVE DIAGNOSIS:  Pilar Plate breech presentation, mild preeclampsia,  intrauterine gestation at 36-4/7 weeks.   ANESTHESIA:  Spinal.   SURGEON:  Servando Salina, M.D.   ASSISTANT:  [place] B. Rosana Hoes, M.D.   DESCRIPTION OF PROCEDURE:  Under adequate spinal anesthesia, the patient was  placed in the supine position with a left lateral tilt.  She was sterilely  prepped and draped in the usual sterile fashion.  An indwelling Foley  catheter was placed.  0.25% Marcaine was injected along the planned  Pfannenstiel skin incision.  The Pfannenstiel skin incision was made and  carried down to the rectus fascia.  The rectus fascia was incised in the  midline and extended bilaterally.  The rectus fascia was then bluntly and  sharply dissected off the rectus muscles in a superior and inferior fashion.  The rectus muscle were split in the midline.  The parietoperitoneum was  entered and extended.  The vesicouterine peritoneum was then opened.  The  bladder was bluntly dissected off the lower uterine segment and displaced  inferiorly using the bladder retractor.  A curvilinear low transverse  uterine incision was then made and extended bilaterally using bandage  scissors.  Copious amount of clear amniotic fluid was then noted.  Subsequent delivery of a live female from a frank breech presentation was  done in the usual breech maneuvers.  The baby was bulb suctioned on the  abdomen.  The cord was clamped and cut.  The baby was transferred to the  awaiting pediatricians who assigned Apgars of 9 and 9 at one and five  minutes.  The placenta was spontaneous and intact.  A short cord was noted.  No intracavitary lesions were noted.  No uterine extension was noted.  The  uterine incision was then closed in two layers, the first layer with 0  Monocryl running locked stitch. The second layer was imbricated using the 0  Monocryl sutures.  The right broad ligament showed a nonexpanding hematoma  noted after the closure of the first layer.  The uterus was therefore  exteriorized and the right broad ligament was inspected.  A nonexpanding  hematoma was noted.  There was noted no extension inferiorly.  Normal tubes  and ovaries were noted bilaterally.  There was an adhesion from the ovary  posteriorly that was lysed.  The abdomen was copiously irrigated and  suctioned of debris.  The uterus was then returned to the abdomen and the  right broad ligament was again inspected and showed it to actually be  decreased and not expanding.  The decision was then made to close  the rest  of the abdomen.  The parietoperitoneum was closed with 2-0 Vicryl.  The  rectus fascia was closed with 0 Vicryl x2.  The subcutaneous area was  irrigated, small bleeders cauterized, and the skin approximated using  Ethicon staples.   SPECIMENS:  Placenta not sent.   ESTIMATED BLOOD LOSS:  500 mL.   FLUIDS REPLACED:  1800 mL crystalloid.   URINE OUTPUT:  300 mL clear yellow urine.   COUNTS:  Needle, sponge, and instrument counts correct x2.   COMPLICATIONS:  None.  Weight of the baby was 6 pounds 12 ounces.  The  patient tolerated the procedure well and was transferred to the recovery  room in stable condition.      [place]/MEDQ  D:  03/10/2004  T:  03/10/2004  Job:  [id number]

## 2010-07-04 NOTE — Assessment & Plan Note (Signed)
[place] OFFICE NOTE   NAME:[name] [name]                   MRN:          [id number]  DATE:04/09/2006                            DOB:          1975/02/29    REASON FOR VISIT:  Follow up on passing out.   [name] [name] is a 35 year old female who is about [redacted] weeks pregnant.  She  reports that she had a syncopal episode on April 05, 2006.  She was  seen in the emergency department where she had a full evaluation which  included a CBC, BMET, urinalysis, and EKG.  She was discharged with no  significant findings on the labs or EKG.  She is here for followup.  According to [name] [name], she has had a long history of syncope and  presyncopal episodes since the age of 15.  Due to having multiple  episodes of presyncope during this pregnancy, Dr. Garwin Brothers was concerned  and recommended further evaluation.  According to [name] [name], typically  she gets a sensation of nausea and a sense that she is going to pass  out.  Typically she is able to drink juice or eat something sugary and  the symptoms resolve.  If she is not able to do that on time she does  faint.  Her most recent episode occurred while she was in the shower.  The patient denied any other associated symptoms, i.e. palpitations,  confusion, amnesia, or sudden syncope.  Typically not associated with  coughing, bowel movement, or exertion.   PAST MEDICAL HISTORY:  1. History of hyperthyroidism, currently euthyroid per patient.  Last      TSH was checked by the endocrinologist about 18 months ago.  2. Guillain-Barre syndrome.   SURGICAL HISTORY:  C-section secondary to preeclampsia in a breech  labor.   MEDICATIONS:  B6 and prenatal vitamins.   ALLERGIES:  No known drug allergies.   FAMILY HISTORY:  Mother passed away with a history of leukemia.  Father  alive with a history of prostate cancer.  She has sibling who has a  history of hypertension.   SOCIAL HISTORY:  The patient is married with one child and she is  currently pregnant.  She denies any tobacco use and drinks alcohol  socially but not while she is pregnant or breastfeeding.   HEALTH MAINTENANCE:  She is followed closely by Dr. Garwin Brothers.   REVIEW OF SYSTEMS:  As per HPI; otherwise unremarkable.   OBJECTIVE:  VITAL SIGNS:  Weight 135.4, pulse 72, blood pressure 130/78.  GENERAL:  We have a pleasant female in no acute distress, answers  questions appropriately, alert and oriented x3.  HEENT:  Normocephalic, atraumatic.  Pupils were equal and reactive to  light.  Extraocular muscles were intact.  No nystagmus was noted.  Tympanic membranes were both clear bilaterally.  Nasal mucosa was within  normal limits.  Oropharynx was benign.  NECK:  Supple.  No lymphadenopathy, carotid bruits, or JVD.  No  thyromegaly.  LUNGS:  Clear.  HEART:  Regular rate and rhythm, normal S1 and S2.  No murmurs, gallops  or rubs heard on my exam.  EXTREMITIES:  No cyanosis, clubbing or edema.  NEUROLOGIC:  Cranial nerves II-XII grossly intact.  No focal  sensorimotor deficits were noted.  Deep tendon reflexes were 2+ and  equal bilaterally.  Cerebellar function was within normal limits.  No  pronator drift was appreciated.  Gait was uninhibited.  PSYCHIATRIC:  Within normal limits.   Reviewed laboratory data from the emergency department as well as the  dictated note from the ED physicians.   IMPRESSION:  A 34 year old pregnant female with a long history of  presyncopal and syncopal episodes.  History very consistent with  hypoglycemia.  Doubt cardiac etiology, but given that there will be  increased stress from a cardiac standpoint during the pregnancy, I felt  that further evaluation is warranted.   PLAN:  1. Will refer the patient to a nutritionist  for treatment of her      hypoglycemia, i.e. a good diet plan.  2. Will refer the patient to cardiology to rule out any cardiac       etiology for her symptoms, although based on history the likelihood      is low but nevertheless, given that she is pregnant it does warrant      a second opinion.  3. Review of the laboratory data done in the emergency department did      not show a TSH and thus I will obtain one today since the last TSH,      according to [name], was done over 18 months ago.     [name] [name], M.D.  Electronically Signed    [place]/MedQ  DD: 04/09/2006  DT: 04/09/2006  Job #: [id number]

## 2010-11-28 LAB — URIC ACID: Uric Acid, Serum: 6.1

## 2010-11-28 LAB — COMPREHENSIVE METABOLIC PANEL
Albumin: 2.7 — ABNORMAL LOW
Alkaline Phosphatase: 97
Calcium: 9.1
Potassium: 3.6
Sodium: 134 — ABNORMAL LOW

## 2010-11-28 LAB — CBC
HCT: 33.1 — ABNORMAL LOW
Hemoglobin: 11.6 — ABNORMAL LOW
MCHC: 35
MCHC: 35.1
MCV: 90
Platelets: 233
RBC: 3.68 — ABNORMAL LOW
RDW: 13
RDW: 13.4
WBC: 11.6 — ABNORMAL HIGH
WBC: 14.2 — ABNORMAL HIGH

## 2010-11-28 LAB — RPR: RPR Ser Ql: NONREACTIVE

## 2010-11-28 LAB — CCBB MATERNAL DONOR DRAW

## 2010-11-28 LAB — LACTATE DEHYDROGENASE: LDH: 119

## 2011-08-05 ENCOUNTER — Other Ambulatory Visit (HOSPITAL_COMMUNITY)
Admission: RE | Admit: 2011-08-05 | Discharge: 2011-08-05 | Disposition: A | Discharge status: Home / Self Care | Payer: PRIVATE HEALTH INSURANCE | Source: Ambulatory Visit | Attending: Family Medicine | Admitting: Family Medicine

## 2011-08-05 ENCOUNTER — Ambulatory Visit (INDEPENDENT_AMBULATORY_CARE_PROVIDER_SITE_OTHER): Payer: PRIVATE HEALTH INSURANCE | Admitting: Family Medicine

## 2011-08-05 ENCOUNTER — Encounter: Payer: Self-pay | Admitting: Family Medicine

## 2011-08-05 VITALS — BP 108/70 | HR 55 | Temp 98.6°F | Ht 65.5 in | Wt 127.6 lb

## 2011-08-05 DIAGNOSIS — Z0000 Encounter for general adult medical examination without abnormal findings: Secondary | ICD-10-CM

## 2011-08-05 DIAGNOSIS — Z01419 Encounter for gynecological examination (general) (routine) without abnormal findings: Secondary | ICD-10-CM | POA: Insufficient documentation

## 2011-08-05 DIAGNOSIS — Z124 Encounter for screening for malignant neoplasm of cervix: Secondary | ICD-10-CM

## 2011-08-05 DIAGNOSIS — K648 Other hemorrhoids: Secondary | ICD-10-CM

## 2011-08-05 LAB — CBC WITH DIFFERENTIAL/PLATELET
Basophils Absolute: 0 10*3/uL (ref 0.0–0.1)
Basophils Relative: 1.1 % (ref 0.0–3.0)
Eosinophils Absolute: 0 10*3/uL (ref 0.0–0.7)
Eosinophils Relative: 0 % (ref 0.0–5.0)
HCT: 41.9 % (ref 36.0–46.0)
Hemoglobin: 14 g/dL (ref 12.0–15.0)
Lymphocytes Relative: 37.1 % (ref 12.0–46.0)
Lymphs Abs: 1.5 10*3/uL (ref 0.7–4.0)
MCHC: 33.5 g/dL (ref 30.0–36.0)
MCV: 92 fl (ref 78.0–100.0)
Monocytes Absolute: 0.3 10*3/uL (ref 0.1–1.0)
Monocytes Relative: 7.3 % (ref 3.0–12.0)
Neutro Abs: 2.2 10*3/uL (ref 1.4–7.7)
Neutrophils Relative %: 54.5 % (ref 43.0–77.0)
Platelets: 260 10*3/uL (ref 150.0–400.0)
RBC: 4.55 Mil/uL (ref 3.87–5.11)
RDW: 12.7 % (ref 11.5–14.6)
WBC: 4.1 10*3/uL — ABNORMAL LOW (ref 4.5–10.5)

## 2011-08-05 LAB — POCT URINALYSIS DIPSTICK
Bilirubin, UA: NEGATIVE
Blood, UA: NEGATIVE
Glucose, UA: NEGATIVE
Ketones, UA: NEGATIVE
Leukocytes, UA: NEGATIVE
Nitrite, UA: NEGATIVE
Protein, UA: NEGATIVE
Spec Grav, UA: 1.02
Urobilinogen, UA: 0.2
pH, UA: 6

## 2011-08-05 LAB — TSH: TSH: 1.4 u[IU]/mL (ref 0.35–5.50)

## 2011-08-05 LAB — HEPATIC FUNCTION PANEL
AST: 22 U/L (ref 0–37)
Bilirubin, Direct: 0 mg/dL (ref 0.0–0.3)

## 2011-08-05 LAB — LIPID PANEL
Cholesterol: 178 mg/dL (ref 0–200)
HDL: 86.8 mg/dL
LDL Cholesterol: 82 mg/dL (ref 0–99)
Total CHOL/HDL Ratio: 2
Triglycerides: 45 mg/dL (ref 0.0–149.0)
VLDL: 9 mg/dL (ref 0.0–40.0)

## 2011-08-05 LAB — BASIC METABOLIC PANEL
Potassium: 3.8 mEq/L (ref 3.5–5.1)
Sodium: 139 mEq/L (ref 135–145)

## 2011-08-05 MED ORDER — HYDROCORTISONE ACETATE 25 MG RE SUPP: 25.0000 mg | Freq: Two times a day (BID) | RECTAL | Status: AC

## 2011-08-05 NOTE — Progress Notes (Signed)
Subjective:     [name] [name] is a 36 y.o. female and is here for a comprehensive physical exam. The patient reports problems - hemorrhoid ext and int and c/o mid back pain that started the last few days and using bothers her 3-4 x a year.  no radiation of pain.  Marland Kitchen  History   Social History  . Marital Status: Married    Spouse Name: N/A    Number of Children: N/A  . Years of Education: N/A   Occupational History  . Not on file.   Social History Main Topics  . Smoking status: Never Smoker   . Smokeless tobacco: Never Used  . Alcohol Use: Yes     Occ.  . Drug Use: No  . Sexually Active: Yes -- Female partner(s)   Other Topics Concern  . Not on file   Social History Narrative  . No narrative on file   Health Maintenance  Topic Date Due  . Pap Smear  06/28/1994  . Influenza Vaccine  11/17/2011  . Tetanus/tdap  05/23/2019    The following portions of the patient's history were reviewed and updated as appropriate: allergies, current medications, past family history, past medical history, past social history, past surgical history and problem list.  Review of Systems Review of Systems  Constitutional: Negative for activity change, appetite change and fatigue.  HENT: Negative for hearing loss, congestion, tinnitus and ear discharge.  dentist q64mEyes: Negative for visual disturbance (see optho q2y -- vision corrected to 20/20 with glasses).  Respiratory: Negative for cough, chest tightness and shortness of breath.   Cardiovascular: Negative for chest pain, palpitations and leg swelling.  Gastrointestinal: Negative for abdominal pain, diarrhea, constipation and abdominal distention.  Genitourinary: Negative for urgency, frequency, decreased urine volume and difficulty urinating.  Musculoskeletal: positive for mid back pain,  no arthralgias and gait problem.  Skin: Negative for color change, pallor and rash.  Neurological: Negative for dizziness, light-headedness, numbness and  headaches.  Hematological: Negative for adenopathy. Does not bruise/bleed easily.  Psychiatric/Behavioral: Negative for suicidal ideas, confusion, sleep disturbance, self-injury, dysphoric mood, decreased concentration and agitation.       Objective:    BP 108/70  Pulse 55  Temp 98.6 F (37 C) (Oral)  Ht 5' 5.5" (1.664 m)  Wt 127 lb 9.6 oz (57.879 kg)  BMI 20.91 kg/m2  SpO2 98%  LMP 07/08/2011 General appearance: alert, cooperative, appears stated age and no distress Head: Normocephalic, without obvious abnormality, atraumatic Eyes: conjunctivae/corneas clear. PERRL, EOM's intact. Fundi benign. Ears: normal TM's and external ear canals both ears Nose: Nares normal. Septum midline. Mucosa normal. No drainage or sinus tenderness. Throat: lips, mucosa, and tongue normal; teeth and gums normal Neck: no adenopathy, no carotid bruit, no JVD, supple, symmetrical, trachea midline and thyroid not enlarged, symmetric, no tenderness/mass/nodules Back: no kyphosis present, no scoliosis present, range of motion normal, + tenderness L scapula Lungs: clear to auscultation bilaterally Breasts: normal appearance, no masses or tenderness Heart: regular rate and rhythm, S1, S2 normal, no murmur, click, rub or gallop Abdomen: soft, non-tender; bowel sounds normal; no masses,  no organomegaly Pelvic: cervix normal in appearance, external genitalia normal, no adnexal masses or tenderness, no cervical motion tenderness, rectovaginal septum normal, uterus normal size, shape, and consistency and vagina normal without discharge Extremities: extremities normal, atraumatic, no cyanosis or edema Pulses: 2+ and symmetric Skin: Skin color, texture, turgor normal. No rashes or lesions Lymph nodes: Cervical, supraclavicular, and axillary nodes normal. Neurologic: Alert  and oriented X 3, normal strength and tone. Normal symmetric reflexes. Normal coordination and gait psych-- no depression, no anxiety   ms--   Pain L side thoracic spine medial to L scapula--- + knot Assessment:    Healthy female exam.     thoracic back pain-- pt to see chiropractor Hemorrhoids--- anusol supp and otc cream--to surgery/ gi if no better Plan:    ghm utd Check labs See After Visit Summary for Counseling Recommendations

## 2011-08-05 NOTE — Patient Instructions (Addendum)
Preventive Care for Adults, Female A healthy lifestyle and preventive care can promote health and wellness. Preventive health guidelines for women include the following key practices.  A routine yearly physical is a good way to check with your caregiver about your health and preventive screening. It is a chance to share any concerns and updates on your health, and to receive a thorough exam.   Visit your dentist for a routine exam and preventive care every 6 months. Brush your teeth twice a day and floss once a day. Good oral hygiene prevents tooth decay and gum disease.   The frequency of eye exams is based on your age, health, family medical history, use of contact lenses, and other factors. Follow your caregiver's recommendations for frequency of eye exams.   Eat a healthy diet. Foods like vegetables, fruits, whole grains, low-fat dairy products, and lean protein foods contain the nutrients you need without too many calories. Decrease your intake of foods high in solid fats, added sugars, and salt. Eat the right amount of calories for you.Get information about a proper diet from your caregiver, if necessary.   Regular physical exercise is one of the most important things you can do for your health. Most adults should get at least 150 minutes of moderate-intensity exercise (any activity that increases your heart rate and causes you to sweat) each week. In addition, most adults need muscle-strengthening exercises on 2 or more days a week.   Maintain a healthy weight. The body mass index (BMI) is a screening tool to identify possible weight problems. It provides an estimate of body fat based on height and weight. Your caregiver can help determine your BMI, and can help you achieve or maintain a healthy weight.For adults 20 years and older:   A BMI below 18.5 is considered underweight.   A BMI of 18.5 to 24.9 is normal.   A BMI of 25 to 29.9 is considered overweight.   A BMI of 30 and above is  considered obese.   Maintain normal blood lipids and cholesterol levels by exercising and minimizing your intake of saturated fat. Eat a balanced diet with plenty of fruit and vegetables. Blood tests for lipids and cholesterol should begin at age 7 and be repeated every 5 years. If your lipid or cholesterol levels are high, you are over 50, or you are at high risk for heart disease, you may need your cholesterol levels checked more frequently.Ongoing high lipid and cholesterol levels should be treated with medicines if diet and exercise are not effective.   If you smoke, find out from your caregiver how to quit. If you do not use tobacco, do not start.   If you are pregnant, do not drink alcohol. If you are breastfeeding, be very cautious about drinking alcohol. If you are not pregnant and choose to drink alcohol, do not exceed 1 drink per day. One drink is considered to be 12 ounces (355 mL) of beer, 5 ounces (148 mL) of wine, or 1.5 ounces (44 mL) of liquor.   Avoid use of street drugs. Do not share needles with anyone. Ask for help if you need support or instructions about stopping the use of drugs.   High blood pressure causes heart disease and increases the risk of stroke. Your blood pressure should be checked at least every 1 to 2 years. Ongoing high blood pressure should be treated with medicines if weight loss and exercise are not effective.   If you are 55 to 35  years old, ask your caregiver if you should take aspirin to prevent strokes.   Diabetes screening involves taking a blood sample to check your fasting blood sugar level. This should be done once every 3 years, after age 65, if you are within normal weight and without risk factors for diabetes. Testing should be considered at a younger age or be carried out more frequently if you are overweight and have at least 1 risk factor for diabetes.   Breast cancer screening is essential preventive care for women. You should practice "breast  self-awareness." This means understanding the normal appearance and feel of your breasts and may include breast self-examination. Any changes detected, no matter how small, should be reported to a caregiver. Women in their 27s and 30s should have a clinical breast exam (CBE) by a caregiver as part of a regular health exam every 1 to 3 years. After age 35, women should have a CBE every year. Starting at age 60, women should consider having a mammography (breast X-ray test) every year. Women who have a family history of breast cancer should talk to their caregiver about genetic screening. Women at a high risk of breast cancer should talk to their caregivers about having magnetic resonance imaging (MRI) and a mammography every year.   The Pap test is a screening test for cervical cancer. A Pap test can show cell changes on the cervix that might become cervical cancer if left untreated. A Pap test is a procedure in which cells are obtained and examined from the lower end of the uterus (cervix).   Women should have a Pap test starting at age 4.   Between ages 54 and 6, Pap tests should be repeated every 2 years.   Beginning at age 50, you should have a Pap test every 3 years as long as the past 3 Pap tests have been normal.   Some women have medical problems that increase the chance of getting cervical cancer. Talk to your caregiver about these problems. It is especially important to talk to your caregiver if a new problem develops soon after your last Pap test. In these cases, your caregiver may recommend more frequent screening and Pap tests.   The above recommendations are the same for women who have or have not gotten the vaccine for human papillomavirus (HPV).   If you had a hysterectomy for a problem that was not cancer or a condition that could lead to cancer, then you no longer need Pap tests. Even if you no longer need a Pap test, a regular exam is a good idea to make sure no other problems are  starting.   If you are between ages 49 and 45, and you have had normal Pap tests going back 10 years, you no longer need Pap tests. Even if you no longer need a Pap test, a regular exam is a good idea to make sure no other problems are starting.   If you have had past treatment for cervical cancer or a condition that could lead to cancer, you need Pap tests and screening for cancer for at least 20 years after your treatment.   If Pap tests have been discontinued, risk factors (such as a new sexual partner) need to be reassessed to determine if screening should be resumed.   The HPV test is an additional test that may be used for cervical cancer screening. The HPV test looks for the virus that can cause the cell changes on the cervix.  The cells collected during the Pap test can be tested for HPV. The HPV test could be used to screen women aged 79 years and older, and should be used in women of any age who have unclear Pap test results. After the age of 58, women should have HPV testing at the same frequency as a Pap test.   Colorectal cancer can be detected and often prevented. Most routine colorectal cancer screening begins at the age of 49 and continues through age 30. However, your caregiver may recommend screening at an earlier age if you have risk factors for colon cancer. On a yearly basis, your caregiver may provide home test kits to check for hidden blood in the stool. Use of a small camera at the end of a tube, to directly examine the colon (sigmoidoscopy or colonoscopy), can detect the earliest forms of colorectal cancer. Talk to your caregiver about this at age 77, when routine screening begins. Direct examination of the colon should be repeated every 5 to 10 years through age 73, unless early forms of pre-cancerous polyps or small growths are found.   Hepatitis C blood testing is recommended for all people born from [birth year] through 1965 and any individual with known risks for hepatitis C.    Practice safe sex. Use condoms and avoid high-risk sexual practices to reduce the spread of sexually transmitted infections (STIs). STIs include gonorrhea, chlamydia, syphilis, trichomonas, herpes, HPV, and human immunodeficiency virus (HIV). Herpes, HIV, and HPV are viral illnesses that have no cure. They can result in disability, cancer, and death. Sexually active women aged 36 and younger should be checked for chlamydia. Older women with new or multiple partners should also be tested for chlamydia. Testing for other STIs is recommended if you are sexually active and at increased risk.   Osteoporosis is a disease in which the bones lose minerals and strength with aging. This can result in serious bone fractures. The risk of osteoporosis can be identified using a bone density scan. Women ages 73 and over and women at risk for fractures or osteoporosis should discuss screening with their caregivers. Ask your caregiver whether you should take a calcium supplement or vitamin D to reduce the rate of osteoporosis.   Menopause can be associated with physical symptoms and risks. Hormone replacement therapy is available to decrease symptoms and risks. You should talk to your caregiver about whether hormone replacement therapy is right for you.   Use sunscreen with sun protection factor (SPF) of 30 or more. Apply sunscreen liberally and repeatedly throughout the day. You should seek shade when your shadow is shorter than you. Protect yourself by wearing long sleeves, pants, a wide-brimmed hat, and sunglasses year round, whenever you are outdoors.   Once a month, do a whole body skin exam, using a mirror to look at the skin on your back. Notify your caregiver of new moles, moles that have irregular borders, moles that are larger than a pencil eraser, or moles that have changed in shape or color.   Stay current with required immunizations.   Influenza. You need a dose every fall (or winter). The composition of  the flu vaccine changes each year, so being vaccinated once is not enough.   Pneumococcal polysaccharide. You need 1 to 2 doses if you smoke cigarettes or if you have certain chronic medical conditions. You need 1 dose at age 85 (or older) if you have never been vaccinated.   Tetanus, diphtheria, pertussis (Tdap, Td). Get 1 dose of  Tdap vaccine if you are younger than age 15, are over 82 and have contact with an infant, are a Dietitian, are pregnant, or simply want to be protected from whooping cough. After that, you need a Td booster dose every 10 years. Consult your caregiver if you have not had at least 3 tetanus and diphtheria-containing shots sometime in your life or have a deep or dirty wound.   HPV. You need this vaccine if you are a woman age 67 or younger. The vaccine is given in 3 doses over 6 months.   Measles, mumps, rubella (MMR). You need at least 1 dose of MMR if you were born in 1957 or later. You may also need a second dose.   Meningococcal. If you are age 73 to 5 and a first-year college student living in a residence hall, or have one of several medical conditions, you need to get vaccinated against meningococcal disease. You may also need additional booster doses.   Zoster (shingles). If you are age 42 or older, you should get this vaccine.   Varicella (chickenpox). If you have never had chickenpox or you were vaccinated but received only 1 dose, talk to your caregiver to find out if you need this vaccine.   Hepatitis A. You need this vaccine if you have a specific risk factor for hepatitis A virus infection or you simply wish to be protected from this disease. The vaccine is usually given as 2 doses, 6 to 18 months apart.   Hepatitis B. You need this vaccine if you have a specific risk factor for hepatitis B virus infection or you simply wish to be protected from this disease. The vaccine is given in 3 doses, usually over 6 months.  Preventive Services /  Frequency Ages 53 to 52  Blood pressure check.** / Every 1 to 2 years.   Lipid and cholesterol check.** / Every 5 years beginning at age 51.   Clinical breast exam.** / Every 3 years for women in their 35s and 67s.   Pap test.** / Every 2 years from ages 43 through 61. Every 3 years starting at age 83 through age 17 or 70 with a history of 3 consecutive normal Pap tests.   HPV screening.** / Every 3 years from ages 31 through ages 32 to 11 with a history of 3 consecutive normal Pap tests.   Hepatitis C blood test.** / For any individual with known risks for hepatitis C.   Skin self-exam. / Monthly.   Influenza immunization.** / Every year.   Pneumococcal polysaccharide immunization.** / 1 to 2 doses if you smoke cigarettes or if you have certain chronic medical conditions.   Tetanus, diphtheria, pertussis (Tdap, Td) immunization. / A one-time dose of Tdap vaccine. After that, you need a Td booster dose every 10 years.   HPV immunization. / 3 doses over 6 months, if you are 52 and younger.   Measles, mumps, rubella (MMR) immunization. / You need at least 1 dose of MMR if you were born in 1957 or later. You may also need a second dose.   Meningococcal immunization. / 1 dose if you are age 32 to 13 and a first-year college student living in a residence hall, or have one of several medical conditions, you need to get vaccinated against meningococcal disease. You may also need additional booster doses.   Varicella immunization.** / Consult your caregiver.   Hepatitis A immunization.** / Consult your caregiver. 2 doses, 6 to 18 months  apart.   Hepatitis B immunization.** / Consult your caregiver. 3 doses usually over 6 months.  Ages 70 to 55  Blood pressure check.** / Every 1 to 2 years.   Lipid and cholesterol check.** / Every 5 years beginning at age 54.   Clinical breast exam.** / Every year after age 13.   Mammogram.** / Every year beginning at age 36 and continuing for as  long as you are in good health. Consult with your caregiver.   Pap test.** / Every 3 years starting at age 20 through age 8 or 93 with a history of 3 consecutive normal Pap tests.   HPV screening.** / Every 3 years from ages 62 through ages 50 to 28 with a history of 3 consecutive normal Pap tests.   Fecal occult blood test (FOBT) of stool. / Every year beginning at age 29 and continuing until age 55. You may not need to do this test if you get a colonoscopy every 10 years.   Flexible sigmoidoscopy or colonoscopy.** / Every 5 years for a flexible sigmoidoscopy or every 10 years for a colonoscopy beginning at age 42 and continuing until age 29.   Hepatitis C blood test.** / For all people born from [birth year] through 1964 and any individual with known risks for hepatitis C.   Skin self-exam. / Monthly.   Influenza immunization.** / Every year.   Pneumococcal polysaccharide immunization.** / 1 to 2 doses if you smoke cigarettes or if you have certain chronic medical conditions.   Tetanus, diphtheria, pertussis (Tdap, Td) immunization.** / A one-time dose of Tdap vaccine. After that, you need a Td booster dose every 10 years.   Measles, mumps, rubella (MMR) immunization. / You need at least 1 dose of MMR if you were born in 1957 or later. You may also need a second dose.   Varicella immunization.** / Consult your caregiver.   Meningococcal immunization.** / Consult your caregiver.   Hepatitis A immunization.** / Consult your caregiver. 2 doses, 6 to 18 months apart.   Hepatitis B immunization.** / Consult your caregiver. 3 doses, usually over 6 months.  Ages 62 and over  Blood pressure check.** / Every 1 to 2 years.   Lipid and cholesterol check.** / Every 5 years beginning at age 11.   Clinical breast exam.** / Every year after age 74.   Mammogram.** / Every year beginning at age 37 and continuing for as long as you are in good health. Consult with your caregiver.   Pap test.** /  Every 3 years starting at age 52 through age 45 or 58 with a 3 consecutive normal Pap tests. Testing can be stopped between 65 and 70 with 3 consecutive normal Pap tests and no abnormal Pap or HPV tests in the past 10 years.   HPV screening.** / Every 3 years from ages 40 through ages 66 or 1 with a history of 3 consecutive normal Pap tests. Testing can be stopped between 65 and 70 with 3 consecutive normal Pap tests and no abnormal Pap or HPV tests in the past 10 years.   Fecal occult blood test (FOBT) of stool. / Every year beginning at age 34 and continuing until age 56. You may not need to do this test if you get a colonoscopy every 10 years.   Flexible sigmoidoscopy or colonoscopy.** / Every 5 years for a flexible sigmoidoscopy or every 10 years for a colonoscopy beginning at age 42 and continuing until age 62.   Hepatitis  C blood test.** / For all people born from [birth year] through 1964 and any individual with known risks for hepatitis C.   Osteoporosis screening.** / A one-time screening for women ages 60 and over and women at risk for fractures or osteoporosis.   Skin self-exam. / Monthly.   Influenza immunization.** / Every year.   Pneumococcal polysaccharide immunization.** / 1 dose at age 36 (or older) if you have never been vaccinated.   Tetanus, diphtheria, pertussis (Tdap, Td) immunization. / A one-time dose of Tdap vaccine if you are over 65 and have contact with an infant, are a Dietitian, or simply want to be protected from whooping cough. After that, you need a Td booster dose every 10 years.   Varicella immunization.** / Consult your caregiver.   Meningococcal immunization.** / Consult your caregiver.   Hepatitis A immunization.** / Consult your caregiver. 2 doses, 6 to 18 months apart.   Hepatitis B immunization.** / Check with your caregiver. 3 doses, usually over 6 months.  ** Family history and personal history of risk and conditions may change your caregiver's  recommendations. Document Released: 03/31/2001 Document Revised: 01/22/2011 Document Reviewed: 06/30/2010 [facility] Patient Information 2012 [place], [place].

## 2011-08-10 ENCOUNTER — Other Ambulatory Visit: Payer: Self-pay | Admitting: Family Medicine

## 2011-08-10 DIAGNOSIS — Z1239 Encounter for other screening for malignant neoplasm of breast: Secondary | ICD-10-CM

## 2011-08-11 ENCOUNTER — Telehealth: Payer: Self-pay

## 2011-08-11 NOTE — Telephone Encounter (Signed)
Message copied by Ewing Schlein on Tue Aug 11, 2011  8:19 AM ------      Message from: Burgess Estelle B      Created: Mon Aug 10, 2011  3:56 PM      Regarding: mmg       The diagnosis entered in the order for this patient's MMG is screening for malignant neoplasm of the cervix.  Will you please remove this order and/or change diagnosis.

## 2011-08-11 NOTE — Telephone Encounter (Signed)
The old order has been removed.    KP

## 2011-09-28 ENCOUNTER — Ambulatory Visit
Admission: RE | Admit: 2011-09-28 | Discharge: 2011-09-28 | Disposition: A | Discharge status: Home / Self Care | Payer: PRIVATE HEALTH INSURANCE | Source: Ambulatory Visit | Attending: Family Medicine | Admitting: Family Medicine

## 2011-09-28 DIAGNOSIS — Z1239 Encounter for other screening for malignant neoplasm of breast: Secondary | ICD-10-CM

## 2012-06-06 ENCOUNTER — Telehealth: Payer: Self-pay | Admitting: Family Medicine

## 2012-06-06 NOTE — Telephone Encounter (Signed)
Patient Information:  Caller Name: Courteny  Phone: [phone]  Patient: [name] [name]  Gender: Female  DOB: 03/19/1975  Age: 37 Years  PCP: Rosalita Chessman.  Pregnant: No  Office Follow Up:  Does the office need to follow up with this patient?: No  Instructions For The Office: N/A   Symptoms  Reason For Call & Symptoms: Pt. reports she has sore throat.  Reviewed Health History In EMR: Yes  Reviewed Medications In EMR: Yes  Reviewed Allergies In EMR: Yes  Reviewed Surgeries / Procedures: Yes  Date of Onset of Symptoms: 06/05/2012 OB / GYN:  LMP: 05/30/2012  Guideline(s) Used:  Sore Throat  Disposition Per Guideline:   See Today in Office  Reason For Disposition Reached:   Pus on tonsils (back of throat) and swollen neck lymph nodes ("glands")  Advice Given:  Call Back If:  You become worse.  For Relief of Sore Throat Pain:  Sip warm chicken broth or apple juice.  Suck on hard candy or a throat lozenge (over-the-counter).  Gargle warm salt water 3 times daily (1 teaspoon of salt in 8 oz or 240 ml of warm water).  Pain Medicines:  For pain relief, you can take either acetaminophen, ibuprofen, or naproxen.  They are over-the-counter (OTC) pain drugs. You can buy them at the drugstore.  Ibuprofen (e.g., Motrin, Advil):  Take 400 mg (two 200 mg pills) by mouth every 6 hours.  Another choice is to take 600 mg (three 200 mg pills) by mouth every 8 hours.  The most you should take each day is 1,200 mg (six 200 mg pills), unless your doctor has told you to take more.  Soft Diet:   Cold drinks and milk shakes are especially good (Reason: swollen tonsils can make some foods hard to swallow).  Liquids:  Adequate liquid intake is important to prevent dehydration. Drink 6-8 glasses of water per day.  Contagiousness:   You can return to work or school after the fever is gone and you feel well enough to participate in normal activities. If your doctor determines that you  have Strep throat, then you will need to take an antibiotic for 24 hours before you can return.  Expected Course:  Sore throats with viral illnesses usually last 3 or 4 days.  Call Back If:  You become worse.  Patient Will Follow Care Advice:  YES  Appointment Scheduled:  06/07/2012 15:45:00 Appointment Scheduled Provider:  Kathlene November

## 2012-06-06 NOTE — Telephone Encounter (Signed)
Appointment Scheduled:  06/07/2012 15:45:00  Appointment Scheduled Provider:  Kathlene November

## 2012-06-07 ENCOUNTER — Ambulatory Visit: Payer: Self-pay | Admitting: Internal Medicine

## 2012-06-07 DIAGNOSIS — Z0289 Encounter for other administrative examinations: Secondary | ICD-10-CM

## 2012-08-08 ENCOUNTER — Other Ambulatory Visit (HOSPITAL_COMMUNITY)
Admission: RE | Admit: 2012-08-08 | Discharge: 2012-08-08 | Disposition: A | Discharge status: Home / Self Care | Payer: PRIVATE HEALTH INSURANCE | Source: Ambulatory Visit | Attending: Family Medicine | Admitting: Family Medicine

## 2012-08-08 ENCOUNTER — Encounter: Payer: Self-pay | Admitting: Family Medicine

## 2012-08-08 ENCOUNTER — Ambulatory Visit (INDEPENDENT_AMBULATORY_CARE_PROVIDER_SITE_OTHER): Payer: PRIVATE HEALTH INSURANCE | Admitting: Family Medicine

## 2012-08-08 VITALS — BP 102/62 | HR 59 | Temp 98.1°F | Ht 65.35 in | Wt 133.8 lb

## 2012-08-08 DIAGNOSIS — K219 Gastro-esophageal reflux disease without esophagitis: Secondary | ICD-10-CM

## 2012-08-08 DIAGNOSIS — L709 Acne, unspecified: Secondary | ICD-10-CM | POA: Insufficient documentation

## 2012-08-08 DIAGNOSIS — R87810 Cervical high risk human papillomavirus (HPV) DNA test positive: Secondary | ICD-10-CM | POA: Insufficient documentation

## 2012-08-08 DIAGNOSIS — Z0000 Encounter for general adult medical examination without abnormal findings: Secondary | ICD-10-CM

## 2012-08-08 DIAGNOSIS — L708 Other acne: Secondary | ICD-10-CM

## 2012-08-08 DIAGNOSIS — Z01419 Encounter for gynecological examination (general) (routine) without abnormal findings: Secondary | ICD-10-CM | POA: Insufficient documentation

## 2012-08-08 DIAGNOSIS — Z124 Encounter for screening for malignant neoplasm of cervix: Secondary | ICD-10-CM

## 2012-08-08 DIAGNOSIS — Z1151 Encounter for screening for human papillomavirus (HPV): Secondary | ICD-10-CM | POA: Insufficient documentation

## 2012-08-08 DIAGNOSIS — K900 Celiac disease: Secondary | ICD-10-CM

## 2012-08-08 DIAGNOSIS — R1013 Epigastric pain: Secondary | ICD-10-CM

## 2012-08-08 HISTORY — DX: Gastro-esophageal reflux disease without esophagitis: K21.9

## 2012-08-08 HISTORY — DX: Acne, unspecified: L70.9

## 2012-08-08 LAB — LIPID PANEL
Cholesterol: 172 mg/dL (ref 0–200)
LDL Cholesterol: 77 mg/dL (ref 0–99)
Total CHOL/HDL Ratio: 2
Triglycerides: 76 mg/dL (ref 0.0–149.0)

## 2012-08-08 LAB — CBC WITH DIFFERENTIAL/PLATELET
Basophils Relative: 0.7 % (ref 0.0–3.0)
Eosinophils Absolute: 0 10*3/uL (ref 0.0–0.7)
Eosinophils Relative: 0 % (ref 0.0–5.0)
HCT: 40.1 % (ref 36.0–46.0)
Hemoglobin: 13.6 g/dL (ref 12.0–15.0)
MCHC: 33.9 g/dL (ref 30.0–36.0)
MCV: 91.1 fl (ref 78.0–100.0)
Monocytes Absolute: 0.2 10*3/uL (ref 0.1–1.0)
Neutro Abs: 2.8 10*3/uL (ref 1.4–7.7)
RBC: 4.4 Mil/uL (ref 3.87–5.11)
WBC: 4.2 10*3/uL — ABNORMAL LOW (ref 4.5–10.5)

## 2012-08-08 LAB — BASIC METABOLIC PANEL
CO2: 30 mEq/L (ref 19–32)
Chloride: 105 mEq/L (ref 96–112)
Potassium: 3.7 mEq/L (ref 3.5–5.1)
Sodium: 138 mEq/L (ref 135–145)

## 2012-08-08 LAB — HEPATIC FUNCTION PANEL
ALT: 12 U/L (ref 0–35)
AST: 16 U/L (ref 0–37)
Total Protein: 7.3 g/dL (ref 6.0–8.3)

## 2012-08-08 MED ORDER — NORGESTIM-ETH ESTRAD TRIPHASIC 0.18/0.215/0.25 MG-25 MCG PO TABS: 1.0000 | ORAL_TABLET | Freq: Every day | ORAL | Status: DC

## 2012-08-08 NOTE — Assessment & Plan Note (Signed)
dexilant 1 po qd Refer to GI HO given to pt

## 2012-08-08 NOTE — Assessment & Plan Note (Signed)
Symptoms controlled

## 2012-08-08 NOTE — Addendum Note (Signed)
Addended by: Ewing Schlein on: 08/08/2012 10:24 AM   Modules accepted: Orders

## 2012-08-08 NOTE — Progress Notes (Signed)
Subjective:     [name] [name] is a 37 y.o. female and is here for a comprehensive physical exam. The patient reports problems - mid epigastric burning --worse with hunger ,  food makes it better but choc and caffeine make it worse. Tums helps some.  prilosec did not help.   History   Social History  . Marital Status: Married    Spouse Name: N/A    Number of Children: N/A  . Years of Education: N/A   Occupational History  . 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 Smoking status: Never Smoker   . Smokeless tobacco: Never Used  . Alcohol Use: Yes     Comment: Occ.  . Drug Use: No  . Sexually Active: Yes -- Female partner(s)   Other Topics Concern  . Not on file   Social History Narrative   Exercise-- 2-3 x a week    Health Maintenance  Topic Date Due  . Pap Smear  08/09/2015  . Tetanus/tdap  05/23/2019    The following portions of the patient's history were reviewed and updated as appropriate:  She  has a past medical history of Thyroid disease and Allergy. She  does not have any pertinent problems on file. She  has past surgical history that includes Cesarean section. Her family history includes Bipolar disorder in her sister; Cancer (age of onset: 1) in her father; Cancer (age of onset: 69) in her mother; Depression in her father; Hypertension in her mother, sister, and unspecified family member; Leukemia in her mother; Prostate cancer in her father; and Stroke in her maternal grandmother and mother. She  reports that she has never smoked. She has never used smokeless tobacco. She reports that  drinks alcohol. She reports that she does not use illicit drugs. She has a current medication list which includes the following prescription(s): adapalene, benzoyl peroxide, minocycline, and tazarotene. Current Outpatient Prescriptions on File Prior to Visit  Medication Sig Dispense Refill  . adapalene (DIFFERIN) 0.1 % gel Apply topically at bedtime.      . benzoyl  peroxide 10 % gel Apply topically daily.      . minocycline (MINOCIN,DYNACIN) 100 MG capsule Take 100 mg by mouth 2 (two) times daily.       No current facility-administered medications on file prior to visit.   She is allergic to influenza virus vacc split pf..  Review of Systems Review of Systems  Constitutional: Negative for activity change, appetite change and fatigue.  HENT: Negative for hearing loss, congestion, tinnitus and ear discharge.  dentist q60mEyes: Negative for visual disturbance (see optho q2y -- vision corrected to 20/20 with glasses).  Respiratory: Negative for cough, chest tightness and shortness of breath.   Cardiovascular: Negative for chest pain, palpitations and leg swelling.  Gastrointestinal: Negative for abdominal pain, diarrhea, constipation and abdominal distention.  Genitourinary: Negative for urgency, frequency, decreased urine volume and difficulty urinating.  Musculoskeletal: Negative for back pain, arthralgias and gait problem.  Skin: Negative for color change, pallor and rash.  Neurological: Negative for dizziness, light-headedness, numbness and headaches.  Hematological: Negative for adenopathy. Does not bruise/bleed easily.  Psychiatric/Behavioral: Negative for suicidal ideas, confusion, sleep disturbance, self-injury, dysphoric mood, decreased concentration and agitation.       Objective:    BP 102/62  Pulse 59  Temp(Src) 98.1 F (36.7 C) (Oral)  Ht 5' 5.35" (1.66 m)  Wt 133 lb 12.8 oz (60.691 kg)  BMI 22.02 kg/m2  SpO2 95%  LMP 07/28/2012 General appearance: alert, cooperative, appears stated age and no distress Head: Normocephalic, without obvious abnormality, atraumatic Eyes: conjunctivae/corneas clear. PERRL, EOM's intact. Fundi benign. Ears: normal TM's and external ear canals both ears Nose: Nares normal. Septum midline. Mucosa normal. No drainage or sinus tenderness. Throat: lips, mucosa, and tongue normal; teeth and gums  normal Neck: no adenopathy, no carotid bruit, no JVD, supple, symmetrical, trachea midline and thyroid not enlarged, symmetric, no tenderness/mass/nodules Back: symmetric, no curvature. ROM normal. No CVA tenderness. Lungs: clear to auscultation bilaterally Breasts: normal appearance, no masses or tenderness Heart: regular rate and rhythm, S1, S2 normal, no murmur, click, rub or gallop Abdomen: soft, non-tender; bowel sounds normal; no masses,  no organomegaly Pelvic: cervix normal in appearance, external genitalia normal, no adnexal masses or tenderness, no cervical motion tenderness, rectovaginal septum normal, uterus normal size, shape, and consistency and vagina normal without discharge--pap done Extremities: extremities normal, atraumatic, no cyanosis or edema Pulses: 2+ and symmetric Skin: Skin color, texture, turgor normal. No rashes or lesions Lymph nodes: Cervical, supraclavicular, and axillary nodes normal. Neurologic: Alert and oriented X 3, normal strength and tone. Normal symmetric reflexes. Normal coordination and gait Psych-- no depression, no anxiety      Assessment:    Healthy female exam.      Plan:     ghtm utd Check labs See After Visit Summary for Counseling Recommendations

## 2012-08-08 NOTE — Assessment & Plan Note (Signed)
Restart bcp  con't tx from derm

## 2012-08-08 NOTE — Patient Instructions (Signed)
Preventive Care for Adults, Female A healthy lifestyle and preventive care can promote health and wellness. Preventive health guidelines for women include the following key practices.  A routine yearly physical is a good way to check with your caregiver about your health and preventive screening. It is a chance to share any concerns and updates on your health, and to receive a thorough exam.  Visit your dentist for a routine exam and preventive care every 6 months. Brush your teeth twice a day and floss once a day. Good oral hygiene prevents tooth decay and gum disease.  The frequency of eye exams is based on your age, health, family medical history, use of contact lenses, and other factors. Follow your caregiver's recommendations for frequency of eye exams.  Eat a healthy diet. Foods like vegetables, fruits, whole grains, low-fat dairy products, and lean protein foods contain the nutrients you need without too many calories. Decrease your intake of foods high in solid fats, added sugars, and salt. Eat the right amount of calories for you.Get information about a proper diet from your caregiver, if necessary.  Regular physical exercise is one of the most important things you can do for your health. Most adults should get at least 150 minutes of moderate-intensity exercise (any activity that increases your heart rate and causes you to sweat) each week. In addition, most adults need muscle-strengthening exercises on 2 or more days a week.  Maintain a healthy weight. The body mass index (BMI) is a screening tool to identify possible weight problems. It provides an estimate of body fat based on height and weight. Your caregiver can help determine your BMI, and can help you achieve or maintain a healthy weight.For adults 20 years and older:  A BMI below 18.5 is considered underweight.  A BMI of 18.5 to 24.9 is normal.  A BMI of 25 to 29.9 is considered overweight.  A BMI of 30 and above is  considered obese.  Maintain normal blood lipids and cholesterol levels by exercising and minimizing your intake of saturated fat. Eat a balanced diet with plenty of fruit and vegetables. Blood tests for lipids and cholesterol should begin at age 28 and be repeated every 5 years. If your lipid or cholesterol levels are high, you are over 50, or you are at high risk for heart disease, you may need your cholesterol levels checked more frequently.Ongoing high lipid and cholesterol levels should be treated with medicines if diet and exercise are not effective.  If you smoke, find out from your caregiver how to quit. If you do not use tobacco, do not start.  If you are pregnant, do not drink alcohol. If you are breastfeeding, be very cautious about drinking alcohol. If you are not pregnant and choose to drink alcohol, do not exceed 1 drink per day. One drink is considered to be 12 ounces (355 mL) of beer, 5 ounces (148 mL) of wine, or 1.5 ounces (44 mL) of liquor.  Avoid use of street drugs. Do not share needles with anyone. Ask for help if you need support or instructions about stopping the use of drugs.  High blood pressure causes heart disease and increases the risk of stroke. Your blood pressure should be checked at least every 1 to 2 years. Ongoing high blood pressure should be treated with medicines if weight loss and exercise are not effective.  If you are 7 to 36 years old, ask your caregiver if you should take aspirin to prevent strokes.  Diabetes  screening involves taking a blood sample to check your fasting blood sugar level. This should be done once every 3 years, after age 48, if you are within normal weight and without risk factors for diabetes. Testing should be considered at a younger age or be carried out more frequently if you are overweight and have at least 1 risk factor for diabetes.  Breast cancer screening is essential preventive care for women. You should practice "breast  self-awareness." This means understanding the normal appearance and feel of your breasts and may include breast self-examination. Any changes detected, no matter how small, should be reported to a caregiver. Women in their 65s and 30s should have a clinical breast exam (CBE) by a caregiver as part of a regular health exam every 1 to 3 years. After age 15, women should have a CBE every year. Starting at age 60, women should consider having a mammography (breast X-ray test) every year. Women who have a family history of breast cancer should talk to their caregiver about genetic screening. Women at a high risk of breast cancer should talk to their caregivers about having magnetic resonance imaging (MRI) and a mammography every year.  The Pap test is a screening test for cervical cancer. A Pap test can show cell changes on the cervix that might become cervical cancer if left untreated. A Pap test is a procedure in which cells are obtained and examined from the lower end of the uterus (cervix).  Women should have a Pap test starting at age 22.  Between ages 99 and 103, Pap tests should be repeated every 2 years.  Beginning at age 9, you should have a Pap test every 3 years as long as the past 3 Pap tests have been normal.  Some women have medical problems that increase the chance of getting cervical cancer. Talk to your caregiver about these problems. It is especially important to talk to your caregiver if a new problem develops soon after your last Pap test. In these cases, your caregiver may recommend more frequent screening and Pap tests.  The above recommendations are the same for women who have or have not gotten the vaccine for human papillomavirus (HPV).  If you had a hysterectomy for a problem that was not cancer or a condition that could lead to cancer, then you no longer need Pap tests. Even if you no longer need a Pap test, a regular exam is a good idea to make sure no other problems are  starting.  If you are between ages 68 and 32, and you have had normal Pap tests going back 10 years, you no longer need Pap tests. Even if you no longer need a Pap test, a regular exam is a good idea to make sure no other problems are starting.  If you have had past treatment for cervical cancer or a condition that could lead to cancer, you need Pap tests and screening for cancer for at least 20 years after your treatment.  If Pap tests have been discontinued, risk factors (such as a new sexual partner) need to be reassessed to determine if screening should be resumed.  The HPV test is an additional test that may be used for cervical cancer screening. The HPV test looks for the virus that can cause the cell changes on the cervix. The cells collected during the Pap test can be tested for HPV. The HPV test could be used to screen women aged 27 years and older, and should  be used in women of any age who have unclear Pap test results. After the age of 46, women should have HPV testing at the same frequency as a Pap test.  Colorectal cancer can be detected and often prevented. Most routine colorectal cancer screening begins at the age of 43 and continues through age 24. However, your caregiver may recommend screening at an earlier age if you have risk factors for colon cancer. On a yearly basis, your caregiver may provide home test kits to check for hidden blood in the stool. Use of a small camera at the end of a tube, to directly examine the colon (sigmoidoscopy or colonoscopy), can detect the earliest forms of colorectal cancer. Talk to your caregiver about this at age 14, when routine screening begins. Direct examination of the colon should be repeated every 5 to 10 years through age 87, unless early forms of pre-cancerous polyps or small growths are found.  Hepatitis C blood testing is recommended for all people born from [birth year] through 1965 and any individual with known risks for hepatitis C.  Practice  safe sex. Use condoms and avoid high-risk sexual practices to reduce the spread of sexually transmitted infections (STIs). STIs include gonorrhea, chlamydia, syphilis, trichomonas, herpes, HPV, and human immunodeficiency virus (HIV). Herpes, HIV, and HPV are viral illnesses that have no cure. They can result in disability, cancer, and death. Sexually active women aged 80 and younger should be checked for chlamydia. Older women with new or multiple partners should also be tested for chlamydia. Testing for other STIs is recommended if you are sexually active and at increased risk.  Osteoporosis is a disease in which the bones lose minerals and strength with aging. This can result in serious bone fractures. The risk of osteoporosis can be identified using a bone density scan. Women ages 51 and over and women at risk for fractures or osteoporosis should discuss screening with their caregivers. Ask your caregiver whether you should take a calcium supplement or vitamin D to reduce the rate of osteoporosis.  Menopause can be associated with physical symptoms and risks. Hormone replacement therapy is available to decrease symptoms and risks. You should talk to your caregiver about whether hormone replacement therapy is right for you.  Use sunscreen with sun protection factor (SPF) of 30 or more. Apply sunscreen liberally and repeatedly throughout the day. You should seek shade when your shadow is shorter than you. Protect yourself by wearing long sleeves, pants, a wide-brimmed hat, and sunglasses year round, whenever you are outdoors.  Once a month, do a whole body skin exam, using a mirror to look at the skin on your back. Notify your caregiver of new moles, moles that have irregular borders, moles that are larger than a pencil eraser, or moles that have changed in shape or color.  Stay current with required immunizations.  Influenza. You need a dose every fall (or winter). The composition of the flu vaccine  changes each year, so being vaccinated once is not enough.  Pneumococcal polysaccharide. You need 1 to 2 doses if you smoke cigarettes or if you have certain chronic medical conditions. You need 1 dose at age 84 (or older) if you have never been vaccinated.  Tetanus, diphtheria, pertussis (Tdap, Td). Get 1 dose of Tdap vaccine if you are younger than age 31, are over 55 and have contact with an infant, are a Dietitian, are pregnant, or simply want to be protected from whooping cough. After that, you need a Td  booster dose every 10 years. Consult your caregiver if you have not had at least 3 tetanus and diphtheria-containing shots sometime in your life or have a deep or dirty wound.  HPV. You need this vaccine if you are a woman age 10 or younger. The vaccine is given in 3 doses over 6 months.  Measles, mumps, rubella (MMR). You need at least 1 dose of MMR if you were born in 1957 or later. You may also need a second dose.  Meningococcal. If you are age 58 to 43 and a first-year college student living in a residence hall, or have one of several medical conditions, you need to get vaccinated against meningococcal disease. You may also need additional booster doses.  Zoster (shingles). If you are age 34 or older, you should get this vaccine.  Varicella (chickenpox). If you have never had chickenpox or you were vaccinated but received only 1 dose, talk to your caregiver to find out if you need this vaccine.  Hepatitis A. You need this vaccine if you have a specific risk factor for hepatitis A virus infection or you simply wish to be protected from this disease. The vaccine is usually given as 2 doses, 6 to 18 months apart.  Hepatitis B. You need this vaccine if you have a specific risk factor for hepatitis B virus infection or you simply wish to be protected from this disease. The vaccine is given in 3 doses, usually over 6 months. Preventive Services / Frequency Ages 51 to 50  Blood  pressure check.** / Every 1 to 2 years.  Lipid and cholesterol check.** / Every 5 years beginning at age 63.  Clinical breast exam.** / Every 3 years for women in their 66s and 31s.  Pap test.** / Every 2 years from ages 61 through 65. Every 3 years starting at age 75 through age 36 or 57 with a history of 3 consecutive normal Pap tests.  HPV screening.** / Every 3 years from ages 11 through ages 51 to 50 with a history of 3 consecutive normal Pap tests.  Hepatitis C blood test.** / For any individual with known risks for hepatitis C.  Skin self-exam. / Monthly.  Influenza immunization.** / Every year.  Pneumococcal polysaccharide immunization.** / 1 to 2 doses if you smoke cigarettes or if you have certain chronic medical conditions.  Tetanus, diphtheria, pertussis (Tdap, Td) immunization. / A one-time dose of Tdap vaccine. After that, you need a Td booster dose every 10 years.  HPV immunization. / 3 doses over 6 months, if you are 82 and younger.  Measles, mumps, rubella (MMR) immunization. / You need at least 1 dose of MMR if you were born in 1957 or later. You may also need a second dose.  Meningococcal immunization. / 1 dose if you are age 61 to 66 and a first-year college student living in a residence hall, or have one of several medical conditions, you need to get vaccinated against meningococcal disease. You may also need additional booster doses.  Varicella immunization.** / Consult your caregiver.  Hepatitis A immunization.** / Consult your caregiver. 2 doses, 6 to 18 months apart.  Hepatitis B immunization.** / Consult your caregiver. 3 doses usually over 6 months. Ages 40 to 77  Blood pressure check.** / Every 1 to 2 years.  Lipid and cholesterol check.** / Every 5 years beginning at age 88.  Clinical breast exam.** / Every year after age 19.  Mammogram.** / Every year beginning at age 55  and continuing for as long as you are in good health. Consult with your  caregiver.  Pap test.** / Every 3 years starting at age 47 through age 54 or 31 with a history of 3 consecutive normal Pap tests.  HPV screening.** / Every 3 years from ages 10 through ages 62 to 54 with a history of 3 consecutive normal Pap tests.  Fecal occult blood test (FOBT) of stool. / Every year beginning at age 88 and continuing until age 55. You may not need to do this test if you get a colonoscopy every 10 years.  Flexible sigmoidoscopy or colonoscopy.** / Every 5 years for a flexible sigmoidoscopy or every 10 years for a colonoscopy beginning at age 26 and continuing until age 33.  Hepatitis C blood test.** / For all people born from [birth year] through 1964 and any individual with known risks for hepatitis C.  Skin self-exam. / Monthly.  Influenza immunization.** / Every year.  Pneumococcal polysaccharide immunization.** / 1 to 2 doses if you smoke cigarettes or if you have certain chronic medical conditions.  Tetanus, diphtheria, pertussis (Tdap, Td) immunization.** / A one-time dose of Tdap vaccine. After that, you need a Td booster dose every 10 years.  Measles, mumps, rubella (MMR) immunization. / You need at least 1 dose of MMR if you were born in 1957 or later. You may also need a second dose.  Varicella immunization.** / Consult your caregiver.  Meningococcal immunization.** / Consult your caregiver.  Hepatitis A immunization.** / Consult your caregiver. 2 doses, 6 to 18 months apart.  Hepatitis B immunization.** / Consult your caregiver. 3 doses, usually over 6 months. Ages 44 and over  Blood pressure check.** / Every 1 to 2 years.  Lipid and cholesterol check.** / Every 5 years beginning at age 25.  Clinical breast exam.** / Every year after age 75.  Mammogram.** / Every year beginning at age 36 and continuing for as long as you are in good health. Consult with your caregiver.  Pap test.** / Every 3 years starting at age 62 through age 32 or 52 with a 3  consecutive normal Pap tests. Testing can be stopped between 65 and 70 with 3 consecutive normal Pap tests and no abnormal Pap or HPV tests in the past 10 years.  HPV screening.** / Every 3 years from ages 23 through ages 1 or 46 with a history of 3 consecutive normal Pap tests. Testing can be stopped between 65 and 70 with 3 consecutive normal Pap tests and no abnormal Pap or HPV tests in the past 10 years.  Fecal occult blood test (FOBT) of stool. / Every year beginning at age 62 and continuing until age 55. You may not need to do this test if you get a colonoscopy every 10 years.  Flexible sigmoidoscopy or colonoscopy.** / Every 5 years for a flexible sigmoidoscopy or every 10 years for a colonoscopy beginning at age 72 and continuing until age 32.  Hepatitis C blood test.** / For all people born from [birth year] through 1964 and any individual with known risks for hepatitis C.  Osteoporosis screening.** / A one-time screening for women ages 57 and over and women at risk for fractures or osteoporosis.  Skin self-exam. / Monthly.  Influenza immunization.** / Every year.  Pneumococcal polysaccharide immunization.** / 1 dose at age 71 (or older) if you have never been vaccinated.  Tetanus, diphtheria, pertussis (Tdap, Td) immunization. / A one-time dose of Tdap vaccine if you are over  58 and have contact with an infant, are a Dietitian, or simply want to be protected from whooping cough. After that, you need a Td booster dose every 10 years.  Varicella immunization.** / Consult your caregiver.  Meningococcal immunization.** / Consult your caregiver.  Hepatitis A immunization.** / Consult your caregiver. 2 doses, 6 to 18 months apart.  Hepatitis B immunization.** / Check with your caregiver. 3 doses, usually over 6 months. ** Family history and personal history of risk and conditions may change your caregiver's recommendations. Document Released: 03/31/2001 Document Revised: 04/27/2011  Document Reviewed: 06/30/2010 [facility] Patient Information 2014 [place], [place].   Diet for Gastroesophageal Reflux Disease, Adult Reflux (acid reflux) is when acid from your stomach flows up into the esophagus. When acid comes in contact with the esophagus, the acid causes irritation and soreness (inflammation) in the esophagus. When reflux happens often or so severely that it causes damage to the esophagus, it is called gastroesophageal reflux disease (GERD). Nutrition therapy can help ease the discomfort of GERD. FOODS OR DRINKS TO AVOID OR LIMIT  Smoking or chewing tobacco. Nicotine is one of the most potent stimulants to acid production in the gastrointestinal tract.  Caffeinated and decaffeinated coffee and black tea.  Regular or low-calorie carbonated beverages or energy drinks (caffeine-free carbonated beverages are allowed).   Strong spices, such as black pepper, white pepper, red pepper, cayenne, curry powder, and chili powder.  Peppermint or spearmint.  Chocolate.  High-fat foods, including meats and fried foods. Extra added fats including oils, butter, salad dressings, and nuts. Limit these to less than 8 tsp per day.  Fruits and vegetables if they are not tolerated, such as citrus fruits or tomatoes.  Alcohol.  Any food that seems to aggravate your condition. If you have questions regarding your diet, call your caregiver or a registered dietitian. OTHER THINGS THAT MAY HELP GERD INCLUDE:   Eating your meals slowly, in a relaxed setting.  Eating 5 to 6 small meals per day instead of 3 large meals.  Eliminating food for a period of time if it causes distress.  Not lying down until 3 hours after eating a meal.  Keeping the head of your bed raised 6 to 9 inches (15 to 23 cm) by using a foam wedge or blocks under the legs of the bed. Lying flat may make symptoms worse.  Being physically active. Weight loss may be helpful in reducing reflux in overweight or obese  adults.  Wear loose fitting clothing EXAMPLE MEAL PLAN This meal plan is approximately 2,000 calories based on [email or web address] meal planning guidelines. Breakfast   cup cooked oatmeal.  1 cup strawberries.  1 cup low-fat milk.  1 oz almonds. Snack  1 cup cucumber slices.  6 oz yogurt (made from low-fat or fat-free milk). Lunch  2 slice whole-wheat bread.  2 oz sliced [place].  2 tsp mayonnaise.  1 cup blueberries.  1 cup snap peas. Snack  6 whole-wheat crackers.  1 oz string cheese. Dinner   cup brown rice.  1 cup mixed veggies.  1 tsp olive oil.  3 oz grilled fish. Document Released: 02/02/2005 Document Revised: 04/27/2011 Document Reviewed: 12/19/2010 [facility] Patient Information 2014 [place], [place].

## 2012-08-09 LAB — POCT URINALYSIS DIPSTICK
Bilirubin, UA: NEGATIVE
Blood, UA: NEGATIVE
Glucose, UA: NEGATIVE
Nitrite, UA: NEGATIVE
Spec Grav, UA: <=1.005
Urobilinogen, UA: 0.2

## 2012-08-15 ENCOUNTER — Telehealth: Payer: Self-pay | Admitting: *Deleted

## 2012-08-15 MED ORDER — DEXLANSOPRAZOLE 60 MG PO CPDR: 60.0000 mg | DELAYED_RELEASE_CAPSULE | Freq: Every day | ORAL | Status: DC

## 2012-08-15 NOTE — Telephone Encounter (Signed)
Yes-- send dexliant #30  5 refills

## 2012-08-15 NOTE — Telephone Encounter (Signed)
Pt ;eft VM that the dexilant samples are working however she will not be able to get in to see her GI until August 13. Pt would like to know if you will write her a Rx until then for the med. Please advise

## 2012-08-22 ENCOUNTER — Encounter: Payer: Self-pay | Admitting: Family Medicine

## 2012-09-05 ENCOUNTER — Telehealth: Payer: Self-pay | Admitting: Internal Medicine

## 2012-09-05 ENCOUNTER — Ambulatory Visit (INDEPENDENT_AMBULATORY_CARE_PROVIDER_SITE_OTHER): Payer: PRIVATE HEALTH INSURANCE | Admitting: Nurse Practitioner

## 2012-09-05 ENCOUNTER — Encounter: Payer: Self-pay | Admitting: Nurse Practitioner

## 2012-09-05 VITALS — BP 100/72 | HR 60 | Ht 65.0 in | Wt 135.0 lb

## 2012-09-05 DIAGNOSIS — K30 Functional dyspepsia: Secondary | ICD-10-CM | POA: Insufficient documentation

## 2012-09-05 DIAGNOSIS — R1013 Epigastric pain: Secondary | ICD-10-CM

## 2012-09-05 DIAGNOSIS — K900 Celiac disease: Secondary | ICD-10-CM

## 2012-09-05 HISTORY — DX: Functional dyspepsia: K30

## 2012-09-05 NOTE — Progress Notes (Signed)
Labs one month ago reveal negative H. Pylori, normal CBC, LFTs and TSH.    HPI :  Patient is a 37 year old female diagnosed with celiac disease in 2009. She presents with upper abdominal discomfort which started to occur intermmitently around May but has become frequent. Pain is burning, "cooling sensation", non-radiating. It is worse when she is hungry. Pain unrelated to position / movement.  Though no nausea / vomiting, she is queasy at times. No chance of pregnancy, husband had vasectomy. She started oral birth control pills for acne a week or so ago but GI problems receded that. No other new meds or med changes. She has alternated doxyclince and minocycline for years for acne. No regular use of NSAIDs. She has been on iron and calcium for years as recommended by Dr. Carlean Purl. Patient saw PCP late June. CBC, LFTs, TSH. H.pylori all normal. Following that she was started her on Dexilant which helped initially but symptoms recurred a week or so ago. She has been taking the PPI WITH meals.   Past Medical History  Diagnosis Date  . Thyroid disease   . Allergy     Family History  Problem Relation Age of Onset  . Leukemia Mother   . Hypertension Mother   . Stroke Mother   . Cancer Mother 59    leukemia--cml  . Prostate cancer Father   . Cancer Father 96    prostate  . Depression Father   . Hypertension      siblings  . Hypertension Sister   . Bipolar disorder Sister   . Stroke Maternal Grandmother    History  Substance Use Topics  . Smoking status: Never Smoker   . Smokeless tobacco: Never Used  . Alcohol Use: Yes     Comment: Occ.   Current Outpatient Prescriptions  Medication Sig Dispense Refill  . adapalene (DIFFERIN) 0.1 % gel Apply topically at bedtime.      . benzoyl peroxide 10 % gel Apply topically daily.      Marland Kitchen dexlansoprazole (DEXILANT) 60 MG capsule Take 1 capsule (60 mg total) by mouth daily.  30 capsule  5  . ferrous sulfate 325 (65 FE) MG tablet Take 325 mg by mouth  daily with breakfast.      . minocycline (MINOCIN,DYNACIN) 100 MG capsule Take 100 mg by mouth 2 (two) times daily.      . Multiple Vitamin (MULTIVITAMIN) tablet Take 1 tablet by mouth daily.      . Norgestimate-Ethinyl Estradiol Triphasic (ORTHO TRI-CYCLEN LO) 0.18/0.215/0.25 MG-25 MCG tab Take 1 tablet by mouth daily.  1 Package  11  . tazarotene (AVAGE) 0.1 % cream Apply 1 application topically at bedtime.       No current facility-administered medications for this visit.   Allergies  Allergen Reactions  . Influenza Virus Vacc Split Pf     REACTION: pt had Joellyn Quails--- can not have flu shot   Review of Systems: All systems reviewed and negative except where noted in HPI.  Physical Exam: BP 100/72  Pulse 60  Ht 5' 5"  (1.651 m)  Wt 135 lb (61.236 kg)  BMI 22.47 kg/m2  LMP 08/23/2012 Constitutional: Pleasant,well-developed, white female in no acute distress. HEENT: Normocephalic and atraumatic. Conjunctivae are normal. No scleral icterus. Neck supple.  Cardiovascular: Normal rate, regular rhythm.  Pulmonary/chest: Effort normal and breath sounds normal. No wheezing, rales or rhonchi. Abdominal: Soft, nondistended, mild epigastric tenderness. Mild epigastric tenderness. Bowel sounds active throughout. There are no masses palpable.  No hepatomegaly. Negative carnett's.  Extremities: no edema Lymphadenopathy: No cervical adenopathy noted. Neurological: Alert and oriented to person place and time. Skin: Skin is warm and dry. No rashes noted. Psychiatric: Normal mood and affect. Behavior is normal.  ASSESSMENT AND PLAN:  1. Celiac disease diagnosed 2009 Carlean Purl). Asymptomatic as long as she avoids gluten.   2. Several week history of epigastric burning with mild queasiness, worse on empty stomach. Symptoms initially better with PPI but now recurrent /progressive.  Labs late last month normal. Will schedule patient for EGD for further evaluation. The benefits, risks, and  potential complications of EGD with possible biopsies were discussed with the patient and she agrees to proceed. Advised to continue PPI but change timing to 30 minutes before meal.

## 2012-09-05 NOTE — Telephone Encounter (Signed)
Patient is scheduled for today at 3:30 to see Tye Savoy RNP

## 2012-09-05 NOTE — Patient Instructions (Addendum)
You have been scheduled for an endoscopy with propofol. Please follow written instructions given to you at your visit today. If you use inhalers (even only as needed), please bring them with you on the day of your procedure. Your physician has requested that you go to www.startemmi.com and enter the access code given to you at your visit today. This web site gives a general overview about your procedure. However, you should still follow specific instructions given to you by our office regarding your preparation for the procedure. CC:  [name] MD

## 2012-09-06 ENCOUNTER — Encounter: Payer: Self-pay | Admitting: Nurse Practitioner

## 2012-09-09 NOTE — Progress Notes (Signed)
Agree with Ms. Guenther's assessment and plan. Gatha Mayer, MD, [facility]

## 2012-09-15 ENCOUNTER — Ambulatory Visit: Payer: PRIVATE HEALTH INSURANCE | Admitting: Internal Medicine

## 2012-09-16 ENCOUNTER — Encounter: Payer: PRIVATE HEALTH INSURANCE | Admitting: Internal Medicine

## 2012-09-19 ENCOUNTER — Ambulatory Visit (INDEPENDENT_AMBULATORY_CARE_PROVIDER_SITE_OTHER): Payer: PRIVATE HEALTH INSURANCE | Admitting: Internal Medicine

## 2012-09-19 ENCOUNTER — Encounter: Payer: Self-pay | Admitting: Family Medicine

## 2012-09-19 ENCOUNTER — Encounter: Payer: Self-pay | Admitting: Internal Medicine

## 2012-09-19 VITALS — BP 114/70 | HR 57 | Temp 98.3°F | Wt 137.0 lb

## 2012-09-19 DIAGNOSIS — R202 Paresthesia of skin: Secondary | ICD-10-CM

## 2012-09-19 DIAGNOSIS — M79609 Pain in unspecified limb: Secondary | ICD-10-CM

## 2012-09-19 DIAGNOSIS — M79605 Pain in left leg: Secondary | ICD-10-CM

## 2012-09-19 DIAGNOSIS — K900 Celiac disease: Secondary | ICD-10-CM

## 2012-09-19 DIAGNOSIS — R1013 Epigastric pain: Secondary | ICD-10-CM

## 2012-09-19 DIAGNOSIS — R209 Unspecified disturbances of skin sensation: Secondary | ICD-10-CM

## 2012-09-19 DIAGNOSIS — Z8669 Personal history of other diseases of the nervous system and sense organs: Secondary | ICD-10-CM

## 2012-09-19 MED ORDER — GABAPENTIN 100 MG PO CAPS: ORAL_CAPSULE | ORAL | Status: DC

## 2012-09-19 NOTE — Patient Instructions (Addendum)
Reflux of gastric acid may be asymptomatic as this may occur mainly during sleep.The triggers for reflux  include stress; the "aspirin family" ; alcohol; peppermint; and caffeine (coffee, tea, cola, and chocolate). The aspirin family would include aspirin and the nonsteroidal agents such as ibuprofen &  Naproxen. Tylenol would not cause reflux. If having symptoms ; food & drink should be avoided for @ least 2 hours before going to bed.

## 2012-09-19 NOTE — Progress Notes (Signed)
  Subjective:    Patient ID: [name] [name], female    DOB: 1975/08/15, 37 y.o.   MRN: [id number]  HPI   Symptoms began as left posterior thigh soreness 7/29 up to level 10 which progressed until 7/31.Ice & heat helped.It is now a 2-3. She had performed her workout 7/28 but she is physically fit and has not had significant myalgias following her regular exercise program  As of 8/1 she noted numbness in the left upper third of the shin area but not the left calf. The numbness progressed until 8/3; but it is now subsiding.  As of 8/3 she began to have intermittent "instantaneous" electrical "jolts" from the infrapatellar area to the upper third of the shin on the left. Walking or position change in bed triggers this.  She also has been having abdominal burning with associated nausea and fullness since May. Initially seemed to help but not at this time. 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 There's been no redness or swelling of the joints themselves.  She denies fever, chills, unexplained weight loss, melena, rectal bleeding, or dysphagia.       Objective:   Physical Exam Gen.: Very healthy and well-nourished in appearance. Alert, appropriate and cooperative throughout exam.Appears younger than stated age    Eyes: No corneal or conjunctival inflammation noted. No icterus Neck: No deformities, masses, or tenderness noted. Range of motion normal.                       Musculoskeletal/extremities: No clubbing, cyanosis, edema, or significant extremity  deformity noted. Range of motion normal .Tone & strength  Normal. Joints normal . Nail  health good. There is no patellar effusion or significant crepitus of the knees. Homans sign is negative bilaterally. There is no discomfort compression of the posterior thigh. Able to lie down & sit up w/o help. Negative SLR bilaterally Vascular:  dorsalis pedis and  posterior tibial pulses are full and equal. No bruits present. Neurologic: Alert and oriented x3. Deep tendon reflexes symmetrical and normal.  Gait normal  including heel & toe walking .        Skin: Intact without suspicious lesions or rashes. Skin is cool over the lower extremities despite the good pedal pulses. Lymph: No cervical, axillary, or inguinal lymphadenopathy present. Psych: Mood and affect are normal. Normally interactive                                                                                        Assessment & Plan:  #1 posterior thigh pain improved #2 numbness L upper shin better #3 neural pain upper shin with position change @ night  & ambulation #4 gastritis , persistent despite Dexilant See Orders

## 2012-09-27 ENCOUNTER — Other Ambulatory Visit (INDEPENDENT_AMBULATORY_CARE_PROVIDER_SITE_OTHER): Payer: PRIVATE HEALTH INSURANCE

## 2012-09-27 ENCOUNTER — Ambulatory Visit (AMBULATORY_SURGERY_CENTER): Payer: PRIVATE HEALTH INSURANCE | Admitting: Internal Medicine

## 2012-09-27 ENCOUNTER — Encounter: Payer: Self-pay | Admitting: Internal Medicine

## 2012-09-27 VITALS — BP 133/76 | HR 59 | Temp 97.9°F | Resp 15 | Ht 65.0 in | Wt 135.0 lb

## 2012-09-27 DIAGNOSIS — R1013 Epigastric pain: Secondary | ICD-10-CM

## 2012-09-27 DIAGNOSIS — D1330 Benign neoplasm of unspecified part of small intestine: Secondary | ICD-10-CM

## 2012-09-27 DIAGNOSIS — K900 Celiac disease: Secondary | ICD-10-CM

## 2012-09-27 DIAGNOSIS — K2990 Gastroduodenitis, unspecified, without bleeding: Secondary | ICD-10-CM

## 2012-09-27 DIAGNOSIS — K2970 Gastritis, unspecified, without bleeding: Secondary | ICD-10-CM

## 2012-09-27 MED ORDER — SODIUM CHLORIDE 0.9 % IV SOLN: 500.0000 mL | INTRAVENOUS | Status: DC

## 2012-09-27 NOTE — Progress Notes (Signed)
Patient did not experience any of the following events: a burn prior to discharge; a fall within the facility; wrong site/side/patient/procedure/implant event; or a hospital transfer or hospital admission upon discharge from the facility. (G8907) Patient did not have preoperative order for IV antibiotic SSI prophylaxis. (G8918)  

## 2012-09-27 NOTE — Patient Instructions (Addendum)
The stomach may be irritated - called gastritis. I took biopsies so we will know. The duodenum looked normal but I took biopsies to see how it is re: celiac disease status. Overall things look good.  You will receive results by phone and we will regroup then.  I appreciate the opportunity to care for you. Gatha Mayer, MD, FACG   YOU HAD AN ENDOSCOPIC PROCEDURE TODAY AT [facility]: Refer to the procedure report that was given to you for any specific questions about what was found during the examination.  If the procedure report does not answer your questions, please call your gastroenterologist to clarify.  If you requested that your care partner not be given the details of your procedure findings, then the procedure report has been included in a sealed envelope for you to review at your convenience later.  YOU SHOULD EXPECT: Some feelings of bloating in the abdomen. Passage of more gas than usual.  Walking can help get rid of the air that was put into your GI tract during the procedure and reduce the bloating. If you had a lower endoscopy (such as a colonoscopy or flexible sigmoidoscopy) you may notice spotting of blood in your stool or on the toilet paper. If you underwent a bowel prep for your procedure, then you may not have a normal bowel movement for a few days.  DIET: Your first meal following the procedure should be a light meal and then it is ok to progress to your normal diet.  A half-sandwich or bowl of soup is an example of a good first meal.  Heavy or fried foods are harder to digest and may make you feel nauseous or bloated.  Likewise meals heavy in dairy and vegetables can cause extra gas to form and this can also increase the bloating.  Drink plenty of fluids but you should avoid alcoholic beverages for 24 hours.  ACTIVITY: Your care partner should take you home directly after the procedure.  You should plan to take it easy, moving slowly for the rest of the day.   You can resume normal activity the day after the procedure however you should NOT DRIVE or use heavy machinery for 24 hours (because of the sedation medicines used during the test).    SYMPTOMS TO REPORT IMMEDIATELY: A gastroenterologist can be reached at any hour.  During normal business hours, 8:30 AM to 5:00 PM Monday through Friday, call [phone].  After hours and on weekends, please call the GI answering service at [phone] who will take a message and have the physician on call contact you.   Following upper endoscopy ( EGD)       Vomiting of blood or coffee ground material        New chest pain or pain under the shoulder blades        Painful or persistently difficult swallowing       New shortness of breath       Fever of 100 degrees or higher       Black Tarry looking stools         FOLLOW UP: If any biopsies were taken you will be contacted by phone or by letter within the next 1-3 weeks.  Call your gastroenterologist if you have not heard about the biopsies in 3 weeks.  Our staff will call the home number listed on your records the next business day following your procedure to check on you and address any questions  or concerns that you may have at that time regarding the information given to you following your procedure. This is a courtesy call and so if there is no answer at the home number and we have not heard from you through the emergency physician on call, we will assume that you have returned to your regular daily activities without incident.  SIGNATURES/CONFIDENTIALITY: You and/or your care partner have signed paperwork which will be entered into your electronic medical record.  These signatures attest to the fact that that the information above on your After Visit Summary has been reviewed and is understood.  Full responsibility of the confidentiality of this discharge information lies with you and/or your care-partner.   Resume medications. Per Dr. Carlean Purl  pt. To have B12 upon discharge.

## 2012-09-27 NOTE — Progress Notes (Signed)
Called to room to assist during endoscopic procedure.  Patient ID and intended procedure confirmed with present staff. Received instructions for my participation in the procedure from the performing physician.  

## 2012-09-27 NOTE — Op Note (Addendum)
[facility]  [address] [place], [phone]   ENDOSCOPY PROCEDURE REPORT  PATIENT: [name] [name], [name] [name]  MR#: [id number] BIRTHDATE: 1975-03-18 , 37  yrs. old GENDER: Female ENDOSCOPIST: Gatha Mayer, MD, [facility] PROCEDURE DATE:  09/27/2012 PROCEDURE:  EGD w/ biopsy ASA CLASS:     Class II INDICATIONS:  Epigastric pain. MEDICATIONS: propofol (Diprivan) 257m IV, MAC sedation, administered by CRNA, and These medications were titrated to patient response per physician's verbal order TOPICAL ANESTHETIC: none  DESCRIPTION OF PROCEDURE: After the risks benefits and alternatives of the procedure were thoroughly explained, informed consent was obtained.  The LB [id number] was introduced through the mouth and advanced to the second portion of the duodenum. Without limitations.  The instrument was slowly withdrawn as the mucosa was fully examined.      The upper, middle and distal third of the esophagus were carefully inspected and no abnormalities were noted.  The z-line was well seen at the GEJ.  The endoscope was pushed into the fundus which was normal including a retroflexed view.  The antrum, gastric body, first and second part of the duodenum were unremarkable except for some linear antral erythema.  Multiple biopsies were performed from antrum (? gastritis) and duodenal bulb amd second portion given known celiac disease..Marland Kitchen Retroflexed views revealed no abnormalities.     The scope was then withdrawn from the patient and the procedure completed.  COMPLICATIONS: There were no complications. ENDOSCOPIC IMPRESSION: Normal EGDsuspected - ? mild gastritis, rule out microscopic changes of celiac disease; multiple biopsies from antrum and duodenum taken  RECOMMENDATIONS: Office will call with results/plans Check B12 and RBC Folate today - she is having neuropathic pain in extremities also    eSigned:  CGatha Mayer MD, [facility]08/01/2013 4:32  PM Revised: 09/27/2012 4:32 PM  CC:The Patient

## 2012-09-27 NOTE — Progress Notes (Signed)
No egg or soy allergy. emw

## 2012-09-27 NOTE — Progress Notes (Signed)
A/ox3 pleased with MAC, report to Sheila RN 

## 2012-09-28 ENCOUNTER — Ambulatory Visit: Payer: PRIVATE HEALTH INSURANCE | Admitting: Internal Medicine

## 2012-09-28 ENCOUNTER — Telehealth: Payer: Self-pay | Admitting: *Deleted

## 2012-09-28 LAB — FOLATE RBC: RBC Folate: 1115 ng/mL (ref >=366)

## 2012-09-28 NOTE — Telephone Encounter (Signed)
  Follow up Call-  Call back number 09/27/2012  Post procedure Call Back phone  # [phone]  Permission to leave phone message Yes     Patient questions:  Do you have a fever, pain , or abdominal swelling? no Pain Score  0 *  Have you tolerated food without any problems? yes  Have you been able to return to your normal activities? yes  Do you have any questions about your discharge instructions: Diet   no Medications  no Follow up visit  no  Do you have questions or concerns about your Care? no  Actions: * If pain score is 4 or above: No action needed, pain <4.

## 2012-09-30 NOTE — Progress Notes (Signed)
Quick Note:  B12 and folate tests are fine Biopsy results not in yet ______

## 2012-10-03 NOTE — Progress Notes (Signed)
Quick Note:  Let her know that the biopsies were ok - no active celiac disease in duodenum She had inactive gastritis on stomach biopsy  I doubt her neuropathic symptoms are related to her GI problems  She could need neurology evaluation - she should see her PCP again about this - I am copying Dr. Etter Sjogren  I am willing to see her back in office or answer ?'s she has now also  No letter or recall needed from [facility]   ______

## 2013-02-17 ENCOUNTER — Telehealth: Payer: Self-pay | Admitting: Family Medicine

## 2013-02-17 MED ORDER — DEXLANSOPRAZOLE 60 MG PO CPDR: 60.0000 mg | DELAYED_RELEASE_CAPSULE | Freq: Every day | ORAL | Status: DC

## 2013-02-17 NOTE — Telephone Encounter (Signed)
Patient states she has only 3 pills remaining of her dexlansoprazole (DEXILANT) 60 MG capsule. Needs refill to [facility]. States that they sent refill request on Wednesday. Please advise.

## 2013-08-09 ENCOUNTER — Ambulatory Visit: Payer: PRIVATE HEALTH INSURANCE | Admitting: Family Medicine

## 2013-08-14 ENCOUNTER — Ambulatory Visit (INDEPENDENT_AMBULATORY_CARE_PROVIDER_SITE_OTHER): Payer: BC Managed Care – PPO | Admitting: Family Medicine

## 2013-08-14 ENCOUNTER — Encounter: Payer: Self-pay | Admitting: Family Medicine

## 2013-08-14 VITALS — BP 120/70 | HR 69 | Temp 98.5°F | Wt 132.0 lb

## 2013-08-14 DIAGNOSIS — R590 Localized enlarged lymph nodes: Secondary | ICD-10-CM

## 2013-08-14 DIAGNOSIS — Z0000 Encounter for general adult medical examination without abnormal findings: Secondary | ICD-10-CM

## 2013-08-14 DIAGNOSIS — R599 Enlarged lymph nodes, unspecified: Secondary | ICD-10-CM

## 2013-08-14 LAB — CBC WITH DIFFERENTIAL/PLATELET
BASOS ABS: 0 10*3/uL (ref 0.0–0.1)
BASOS PCT: 0.7 % (ref 0.0–3.0)
EOS ABS: 0 10*3/uL (ref 0.0–0.7)
Eosinophils Relative: 0.5 % (ref 0.0–5.0)
HCT: 34.9 % — ABNORMAL LOW (ref 36.0–46.0)
Hemoglobin: 11.7 g/dL — ABNORMAL LOW (ref 12.0–15.0)
Lymphocytes Relative: 23.9 % (ref 12.0–46.0)
Lymphs Abs: 1.2 10*3/uL (ref 0.7–4.0)
MCHC: 33.6 g/dL (ref 30.0–36.0)
MCV: 88.4 fl (ref 78.0–100.0)
MONO ABS: 0.3 10*3/uL (ref 0.1–1.0)
Monocytes Relative: 5.2 % (ref 3.0–12.0)
NEUTROS PCT: 69.7 % (ref 43.0–77.0)
Neutro Abs: 3.5 10*3/uL (ref 1.4–7.7)
PLATELETS: 330 10*3/uL (ref 150.0–400.0)
RBC: 3.95 Mil/uL (ref 3.87–5.11)
RDW: 14 % (ref 11.5–15.5)
WBC: 5 10*3/uL (ref 4.0–10.5)

## 2013-08-14 MED ORDER — NORGESTIM-ETH ESTRAD TRIPHASIC 0.18/0.215/0.25 MG-25 MCG PO TABS: 1.0000 | ORAL_TABLET | Freq: Every day | ORAL | Status: DC

## 2013-08-14 MED ORDER — CEPHALEXIN 500 MG PO CAPS: 500.0000 mg | ORAL_CAPSULE | Freq: Two times a day (BID) | ORAL | Status: DC

## 2013-08-14 NOTE — Patient Instructions (Signed)
°Lymphadenopathy °Lymphadenopathy means "disease of the lymph glands." But the term is usually used to describe swollen or enlarged lymph glands, also called lymph nodes. These are the bean-shaped organs found in many locations including the neck, underarm, and groin. Lymph glands are part of the immune system, which fights infections in your body. Lymphadenopathy can occur in just one area of the body, such as the neck, or it can be generalized, with lymph node enlargement in several areas. The nodes found in the neck are the most common sites of lymphadenopathy. °CAUSES  °When your immune system responds to germs (such as viruses or bacteria ), infection-fighting cells and fluid build up. This causes the glands to grow in size. This is usually not something to worry about. Sometimes, the glands themselves can become infected and inflamed. This is called lymphadenitis. °Enlarged lymph nodes can be caused by many diseases: °· Bacterial disease, such as strep throat or a skin infection. °· Viral disease, such as a common cold. °· Other germs, such as lyme disease, tuberculosis, or sexually transmitted diseases. °· Cancers, such as lymphoma (cancer of the lymphatic system) or leukemia (cancer of the white blood cells). °· Inflammatory diseases such as lupus or rheumatoid arthritis. °· Reactions to medications. °Many of the diseases above are rare, but important. This is why you should see your caregiver if you have lymphadenopathy. °SYMPTOMS  °· Swollen, enlarged lumps in the neck, back of the head or other locations. °· Tenderness. °· Warmth or redness of the skin over the lymph nodes. °· Fever. °DIAGNOSIS  °Enlarged lymph nodes are often near the source of infection. They can help healthcare providers diagnose your illness. For instance:  °· Swollen lymph nodes around the jaw might be caused by an infection in the mouth. °· Enlarged glands in the neck often signal a throat infection. °· Lymph nodes that are swollen  in more than one area often indicate an illness caused by a virus. °Your caregiver most likely will know what is causing your lymphadenopathy after listening to your history and examining you. Blood tests, x-rays or other tests may be needed. If the cause of the enlarged lymph node cannot be found, and it does not go away by itself, then a biopsy may be needed. Your caregiver will discuss this with you. °TREATMENT  °Treatment for your enlarged lymph nodes will depend on the cause. Many times the nodes will shrink to normal size by themselves, with no treatment. Antibiotics or other medicines may be needed for infection. Only take over-the-counter or prescription medicines for pain, discomfort or fever as directed by your caregiver. °HOME CARE INSTRUCTIONS  °Swollen lymph glands usually return to normal when the underlying medical condition goes away. If they persist, contact your health-care provider. He/she might prescribe antibiotics or other treatments, depending on the diagnosis. Take any medications exactly as prescribed. Keep any follow-up appointments made to check on the condition of your enlarged nodes.  °SEEK MEDICAL CARE IF:  °· Swelling lasts for more than two weeks. °· You have symptoms such as weight loss, night sweats, fatigue or fever that does not go away. °· The lymph nodes are hard, seem fixed to the skin or are growing rapidly. °· Skin over the lymph nodes is red and inflamed. This could mean there is an infection. °SEEK IMMEDIATE MEDICAL CARE IF:  °· Fluid starts leaking from the area of the enlarged lymph node. °· You develop a fever of 102° F (38.9° C) or greater. °· Severe   pain develops (not necessarily at the site of a large lymph node). °· You develop chest pain or shortness of breath. °· You develop worsening abdominal pain. °MAKE SURE YOU:  °· Understand these instructions. °· Will watch your condition. °· Will get help right away if you are not doing well or get worse. °Document  Released: 11/12/2007 Document Revised: 04/27/2011 Document Reviewed: 11/12/2007 °ExitCare® Patient Information ©2015 [employer]. This information is not intended to replace advice given to you by your health care provider. Make sure you discuss any questions you have with your health care provider. ° ° ° °

## 2013-08-14 NOTE — Progress Notes (Signed)
   Subjective:    Patient ID: [name] [name], female    DOB: 29-Jul-1975, 38 y.o.   MRN: [id number]  HPI Pt here c/o nodule in R groin that has been there for a while and is tender at times.  It has gotten smaller.  No other symptoms.  No fever , chills, no abd pain, NV.     Review of Systems As above    Objective:   Physical Exam BP 120/70  Pulse 69  Temp(Src) 98.5 F (36.9 C) (Oral)  Wt 132 lb (59.875 kg)  SpO2 95%  LMP 08/10/2013 General appearance: alert, cooperative, appears stated age and no distress Abdomen: + pea sized nodule R groin -- non tender        Assessment & Plan:    1. Lymphadenopathy, inguinal rto or call if symptoms and nodule does not improve or if it worsens - CBC with Differential - cephALEXin (KEFLEX) 500 MG capsule; Take 1 capsule (500 mg total) by mouth 2 (two) times daily.  Dispense: 20 capsule; Refill: 0

## 2013-08-16 ENCOUNTER — Encounter: Payer: Self-pay | Admitting: Internal Medicine

## 2013-08-16 ENCOUNTER — Ambulatory Visit (INDEPENDENT_AMBULATORY_CARE_PROVIDER_SITE_OTHER): Payer: BC Managed Care – PPO | Admitting: Internal Medicine

## 2013-08-16 ENCOUNTER — Other Ambulatory Visit (INDEPENDENT_AMBULATORY_CARE_PROVIDER_SITE_OTHER): Payer: BC Managed Care – PPO

## 2013-08-16 VITALS — BP 116/70 | HR 76 | Ht 65.0 in | Wt 131.2 lb

## 2013-08-16 DIAGNOSIS — K30 Functional dyspepsia: Secondary | ICD-10-CM

## 2013-08-16 DIAGNOSIS — K900 Celiac disease: Secondary | ICD-10-CM

## 2013-08-16 DIAGNOSIS — K3189 Other diseases of stomach and duodenum: Secondary | ICD-10-CM

## 2013-08-16 DIAGNOSIS — R1013 Epigastric pain: Secondary | ICD-10-CM

## 2013-08-16 LAB — VITAMIN D 25 HYDROXY (VIT D DEFICIENCY, FRACTURES): VITD: 31.4 ng/mL

## 2013-08-16 LAB — IGA: IGA: 166 mg/dL (ref 68–378)

## 2013-08-16 NOTE — Patient Instructions (Addendum)
Your physician has requested that you go to the basement for the following lab work before leaving today: TTG, IGA, vit. D level  Try Gaviscon as needed for functional dyspepsia.  Dr. Carlean Purl may consider using buspirone in the future to help you.   Follow up with [place] in a year or sooner if needed.   I appreciate the opportunity to care for you.

## 2013-08-16 NOTE — Assessment & Plan Note (Addendum)
Seems asymptomatic from this. TTG  Ab to reassess

## 2013-08-16 NOTE — Assessment & Plan Note (Addendum)
Trial of gaviscon, continue Dexilant for now but ? If she really needs this. May need to taper. ? Buspirone

## 2013-08-17 ENCOUNTER — Other Ambulatory Visit: Payer: Self-pay | Admitting: Family Medicine

## 2013-08-17 LAB — TISSUE TRANSGLUTAMINASE, IGA: Tissue Transglutaminase Ab, IgA: 6.9 U/mL (ref <20)

## 2013-08-17 NOTE — Progress Notes (Signed)
   Subjective:    Patient ID: [name] [name], female    DOB: 01-22-1976, 38 y.o.   MRN: [id number]  HPI The patient reports that she continues to have a slight burning or cold sensation epigastric pain.  She believes she is doing a good job with gluten free diet. She has also stopped caffeine and limited acidic foods. No diarrhea bloating or gas. She takes minocycline for acne in cycles but sxs occur with or without that rx. Medications, allergies, past medical history, past surgical history, family history and social history are reviewed and updated in the EMR.  Review of Systems As above    Objective:   Physical Exam WDWN NAD    Assessment & Plan:  CELIAC SPRUE Seems asymptomatic from this. TTG  Ab to reassess  Functional dyspepsia Trial of gaviscon, continue Dexilant for now but ? If she really needs this. May need to taper. ? Buspirone    TTG Abs were ok

## 2013-08-17 NOTE — Telephone Encounter (Signed)
A user error has taken place.

## 2013-08-18 ENCOUNTER — Encounter: Payer: Self-pay | Admitting: Internal Medicine

## 2013-08-25 ENCOUNTER — Telehealth: Payer: Self-pay | Admitting: Family Medicine

## 2013-08-25 DIAGNOSIS — R1909 Other intra-abdominal and pelvic swelling, mass and lump: Secondary | ICD-10-CM

## 2013-08-25 NOTE — Telephone Encounter (Signed)
No notation of next step please advise.

## 2013-08-25 NOTE — Telephone Encounter (Signed)
Caller name: Zahriah  Relation to [id number] Call back [phone] Pharmacy:  Reason for call: patient called and stated that her lymphoid in the left groin area is still hurting her. Patient would like to know what is her next step. Please advise.

## 2013-08-28 ENCOUNTER — Telehealth: Payer: Self-pay | Admitting: Family Medicine

## 2013-08-28 ENCOUNTER — Other Ambulatory Visit: Payer: Self-pay | Admitting: Family Medicine

## 2013-08-28 DIAGNOSIS — R109 Unspecified abdominal pain: Secondary | ICD-10-CM

## 2013-08-28 NOTE — Telephone Encounter (Signed)
[place] groin-- dx mass Left groin

## 2013-08-28 NOTE — Telephone Encounter (Signed)
[facility] states transvag US non OB needs to be added to Pelvic US. Can order be placed?

## 2013-08-28 NOTE — Telephone Encounter (Signed)
Order in and the patient has been made aware.     KP

## 2013-08-28 NOTE — Telephone Encounter (Signed)
ordered

## 2013-08-30 ENCOUNTER — Ambulatory Visit (HOSPITAL_BASED_OUTPATIENT_CLINIC_OR_DEPARTMENT_OTHER): Admission: RE | Admit: 2013-08-30 | Payer: BC Managed Care – PPO | Source: Ambulatory Visit

## 2013-08-30 ENCOUNTER — Ambulatory Visit (HOSPITAL_BASED_OUTPATIENT_CLINIC_OR_DEPARTMENT_OTHER): Payer: BC Managed Care – PPO

## 2013-08-30 ENCOUNTER — Ambulatory Visit (HOSPITAL_BASED_OUTPATIENT_CLINIC_OR_DEPARTMENT_OTHER)
Admission: RE | Admit: 2013-08-30 | Discharge: 2013-08-30 | Disposition: A | Discharge status: Home / Self Care | Payer: BC Managed Care – PPO | Source: Ambulatory Visit | Attending: Family Medicine | Admitting: Family Medicine

## 2013-08-30 DIAGNOSIS — R1909 Other intra-abdominal and pelvic swelling, mass and lump: Secondary | ICD-10-CM | POA: Insufficient documentation

## 2013-08-31 ENCOUNTER — Other Ambulatory Visit: Payer: Self-pay

## 2013-08-31 DIAGNOSIS — R1909 Other intra-abdominal and pelvic swelling, mass and lump: Secondary | ICD-10-CM

## 2013-09-12 ENCOUNTER — Encounter (INDEPENDENT_AMBULATORY_CARE_PROVIDER_SITE_OTHER): Payer: Self-pay | Admitting: General Surgery

## 2013-09-18 ENCOUNTER — Encounter (INDEPENDENT_AMBULATORY_CARE_PROVIDER_SITE_OTHER): Payer: Self-pay | Admitting: General Surgery

## 2013-09-18 ENCOUNTER — Ambulatory Visit (INDEPENDENT_AMBULATORY_CARE_PROVIDER_SITE_OTHER): Payer: BC Managed Care – PPO | Admitting: General Surgery

## 2013-09-18 VITALS — BP 122/74 | HR 68 | Temp 97.4°F | Resp 16 | Ht 66.0 in | Wt 133.0 lb

## 2013-09-18 DIAGNOSIS — R599 Enlarged lymph nodes, unspecified: Secondary | ICD-10-CM

## 2013-09-18 NOTE — Progress Notes (Signed)
Patient ID: [name] [name], female   DOB: Sep 04, 1975, 38 y.o.   MRN: [id number]  No chief complaint on file.   HPI [name] [name] is a 38 y.o. female.  The patient is a 38 year old female who is referred by Dr. Etter Sjogren for evaluation of a left inguinal palpable lymph node. She noticed a lymph node over the last several months. She states there is some tenderness at times. She states that potentially could be getting smaller at this time. She has no B. Type symptoms.  HPI  Past Medical History  Diagnosis Date  . Thyroid disease   . Allergy   . Numbness in left leg     saw dr hopper w/ this  . Gastritis   . Basal cell carcinoma     abdomin  . Functional dyspepsia 09/05/2012    Past Surgical History  Procedure Laterality Date  . Cesarean section    . Wisdom tooth extraction    . Colonoscopy    . Esophagogastroduodenoscopy    . Skin cancer excision      abdomin    Family History  Problem Relation Age of Onset  . Leukemia Mother   . Hypertension Mother   . Stroke Mother   . Cancer Mother 100    leukemia--cml  . Prostate cancer Father   . Prostate cancer Father 72  . Depression Father   . Hypertension      siblings  . Hypertension Sister   . Bipolar disorder Sister   . Stroke Maternal Grandmother   . Colon cancer Neg Hx   . Esophageal cancer Neg Hx   . Stomach cancer Neg Hx   . Rectal cancer Neg Hx     Social History History  Substance Use Topics  . Smoking status: Former Research scientist (life sciences)  . Smokeless tobacco: Never Used     Comment: About age 63   . Alcohol Use: Yes     Comment: Occ.    Allergies  Allergen Reactions  . Influenza Virus Vacc Split Pf     REACTION: pt had Joellyn Quails--- can not have flu shot    Current Outpatient Prescriptions  Medication Sig Dispense Refill  . cephALEXin (KEFLEX) 500 MG capsule Take 1 capsule (500 mg total) by mouth 2 (two) times daily.  20 capsule  0  . clindamycin-benzoyl peroxide (BENZACLIN) gel Apply topically 2 (two)  times daily.      Marland Kitchen DEXILANT 60 MG capsule TAKE 1 CAPSULE (60 MG TOTAL) BY MOUTH DAILY.  30 capsule  5  . minocycline (MINOCIN,DYNACIN) 100 MG capsule Take 100 mg by mouth daily.       . Multiple Vitamin (MULTIVITAMIN) tablet Take 1 tablet by mouth daily.      . Norgestimate-Ethinyl Estradiol Triphasic (ORTHO TRI-CYCLEN LO) 0.18/0.215/0.25 MG-25 MCG tab Take 1 tablet by mouth daily.  1 Package  2  . tazarotene (AVAGE) 0.1 % cream Apply topically at bedtime.       No current facility-administered medications for this visit.    Review of Systems Review of Systems  Constitutional: Negative.   HENT: Negative.   Respiratory: Negative.   Cardiovascular: Negative.   Gastrointestinal: Negative.   Neurological: Negative.   Hematological: Negative for adenopathy.  All other systems reviewed and are negative.   Blood pressure 122/74, pulse 68, temperature 97.4 F (36.3 C), temperature source Temporal, resp. rate 16, height 5' 6"  (1.676 m), weight 133 lb (60.328 kg).  Physical Exam Physical Exam  Constitutional: She is oriented to  person, place, and time. She appears well-developed and well-nourished.  HENT:  Head: Normocephalic and atraumatic.  Eyes: Conjunctivae and EOM are normal. Pupils are equal, round, and reactive to light.  Neck: Normal range of motion. Neck supple.  Cardiovascular: Normal rate, regular rhythm and normal heart sounds.   Pulmonary/Chest: Effort normal and breath sounds normal.  Musculoskeletal: Normal range of motion.  Lymphadenopathy:       Left: Inguinal (0.5 cm, NT, mobile) adenopathy present.  Neurological: She is alert and oriented to person, place, and time.  Skin: Skin is warm and dry.  Psychiatric: She has a normal mood and affect.    Data Reviewed Ultrasound revealed bilobar 1.6 cm lymph node  Assessment    38 year old female with a left inguinal palpable lymph node, likely reactive  .  Plan    1. At this time I am not concerned for any  possible lymphoma. This is likely area reactive lymph node. She does state that potentially could be getting smaller. I do not think there is any benefit for any FNA or any lymph node biopsy. 2. The patient was satisfied with this plan. She will call us back should the node get bigger and/or noticed more nodes.       Rosario Jacks., Jennell Janosik 09/18/2013, 4:38 PM

## 2013-10-11 ENCOUNTER — Ambulatory Visit (INDEPENDENT_AMBULATORY_CARE_PROVIDER_SITE_OTHER): Payer: BC Managed Care – PPO | Admitting: Family Medicine

## 2013-10-11 ENCOUNTER — Encounter: Payer: Self-pay | Admitting: Family Medicine

## 2013-10-11 VITALS — BP 130/82 | HR 58 | Temp 98.2°F | Ht 65.2 in | Wt 131.2 lb

## 2013-10-11 DIAGNOSIS — Z0000 Encounter for general adult medical examination without abnormal findings: Secondary | ICD-10-CM

## 2013-10-11 LAB — CBC WITH DIFFERENTIAL/PLATELET
BASOS ABS: 0 10*3/uL (ref 0.0–0.1)
Basophils Relative: 1.4 % (ref 0.0–3.0)
EOS ABS: 0 10*3/uL (ref 0.0–0.7)
Eosinophils Relative: 1.4 % (ref 0.0–5.0)
HCT: 37 % (ref 36.0–46.0)
HEMOGLOBIN: 12 g/dL (ref 12.0–15.0)
LYMPHS PCT: 36.7 % (ref 12.0–46.0)
Lymphs Abs: 1.1 10*3/uL (ref 0.7–4.0)
MCHC: 32.5 g/dL (ref 30.0–36.0)
MCV: 85.9 fl (ref 78.0–100.0)
MONOS PCT: 8.1 % (ref 3.0–12.0)
Monocytes Absolute: 0.3 10*3/uL (ref 0.1–1.0)
Neutro Abs: 1.6 10*3/uL (ref 1.4–7.7)
Neutrophils Relative %: 52.4 % (ref 43.0–77.0)
PLATELETS: 323 10*3/uL (ref 150.0–400.0)
RBC: 4.31 Mil/uL (ref 3.87–5.11)
RDW: 14.4 % (ref 11.5–15.5)
WBC: 3.1 10*3/uL — ABNORMAL LOW (ref 4.0–10.5)

## 2013-10-11 LAB — LIPID PANEL
CHOL/HDL RATIO: 2
Cholesterol: 178 mg/dL (ref 0–200)
HDL: 88.6 mg/dL (ref >39.00)
LDL Cholesterol: 80 mg/dL (ref 0–99)
NonHDL: 89.4
Triglycerides: 45 mg/dL (ref 0.0–149.0)
VLDL: 9 mg/dL (ref 0.0–40.0)

## 2013-10-11 LAB — TSH: TSH: 0.76 u[IU]/mL (ref 0.35–4.50)

## 2013-10-11 LAB — BASIC METABOLIC PANEL
BUN: 12 mg/dL (ref 6–23)
CALCIUM: 9.3 mg/dL (ref 8.4–10.5)
CO2: 26 mEq/L (ref 19–32)
Chloride: 103 mEq/L (ref 96–112)
Creatinine, Ser: 0.7 mg/dL (ref 0.4–1.2)
GFR: 98.3 mL/min (ref >60.00)
Glucose, Bld: 64 mg/dL — ABNORMAL LOW (ref 70–99)
Potassium: 4 mEq/L (ref 3.5–5.1)
SODIUM: 137 meq/L (ref 135–145)

## 2013-10-11 LAB — HEPATIC FUNCTION PANEL
ALBUMIN: 4.4 g/dL (ref 3.5–5.2)
ALK PHOS: 46 U/L (ref 39–117)
ALT: 21 U/L (ref 0–35)
AST: 41 U/L — AB (ref 0–37)
BILIRUBIN DIRECT: 0 mg/dL (ref 0.0–0.3)
BILIRUBIN TOTAL: 0.8 mg/dL (ref 0.2–1.2)
Total Protein: 7.6 g/dL (ref 6.0–8.3)

## 2013-10-11 MED ORDER — NORGESTIM-ETH ESTRAD TRIPHASIC 0.18/0.215/0.25 MG-25 MCG PO TABS: 1.0000 | ORAL_TABLET | Freq: Every day | ORAL | Status: DC

## 2013-10-11 NOTE — Progress Notes (Signed)
Subjective:     [name] [name] is a 38 y.o. female and is here for a comprehensive physical exam. The patient reports no problems.  History   Social History  . Marital Status: Married    Spouse Name: N/A    Number of Children: N/A  . Years of Education: N/A   Occupational History  . 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 Smoking status: Former Research scientist (life sciences)  . Smokeless tobacco: Never Used     Comment: About age 44   . Alcohol Use: Yes     Comment: Occ.  . Drug Use: No  . Sexual Activity: Yes    Partners: Male   Other Topics Concern  . Not on file   Social History Narrative   Exercise-- 2-3 x a week    Health Maintenance  Topic Date Due  . Pap Smear  08/09/2015  . Tetanus/tdap  05/23/2019    The following portions of the patient's history were reviewed and updated as appropriate:  She  has a past medical history of Thyroid disease; Allergy; Numbness in left leg; Gastritis; Basal cell carcinoma; and Functional dyspepsia (09/05/2012). She  does not have any pertinent problems on file. She  has past surgical history that includes Cesarean section; Wisdom tooth extraction; Colonoscopy; Esophagogastroduodenoscopy; and Skin cancer excision. Her family history includes Bipolar disorder in her sister; Cancer (age of onset: 29) in her mother; Depression in her father; Hypertension in her mother, sister, and another family member; Leukemia in her mother; Prostate cancer in her father; Prostate cancer (age of onset: 49) in her father; Stroke in her maternal grandmother and mother. There is no history of Colon cancer, Esophageal cancer, Stomach cancer, or Rectal cancer. She  reports that she has quit smoking. She has never used smokeless tobacco. She reports that she drinks alcohol. She reports that she does not use illicit drugs. She has a current medication list which includes the following prescription(s): cephalexin, clindamycin-benzoyl peroxide, dexilant, doxycycline,  multivitamin, norgestimate-ethinyl estradiol triphasic, and tazarotene. Current Outpatient Prescriptions on File Prior to Visit  Medication Sig Dispense Refill  . cephALEXin (KEFLEX) 500 MG capsule Take 1 capsule (500 mg total) by mouth 2 (two) times daily.  20 capsule  0  . clindamycin-benzoyl peroxide (BENZACLIN) gel Apply topically 2 (two) times daily.      Marland Kitchen DEXILANT 60 MG capsule TAKE 1 CAPSULE (60 MG TOTAL) BY MOUTH DAILY.  30 capsule  5  . Multiple Vitamin (MULTIVITAMIN) tablet Take 1 tablet by mouth daily.      . tazarotene (AVAGE) 0.1 % cream Apply topically at bedtime.       No current facility-administered medications on file prior to visit.   She is allergic to influenza virus vacc split pf..  Review of Systems Review of Systems  Constitutional: Negative for activity change, appetite change and fatigue.  HENT: Negative for hearing loss, congestion, tinnitus and ear discharge.  dentist q46mEyes: Negative for visual disturbance (see optho q1y -- vision corrected to 20/20 with glasses).  Respiratory: Negative for cough, chest tightness and shortness of breath.   Cardiovascular: Negative for chest pain, palpitations and leg swelling.  Gastrointestinal: Negative for abdominal pain, diarrhea, constipation and abdominal distention.  Genitourinary: Negative for urgency, frequency, decreased urine volume and difficulty urinating.  Musculoskeletal: Negative for back pain, arthralgias and gait problem.  Skin: Negative for color change, pallor and rash.  Neurological: Negative for dizziness, light-headedness, numbness and headaches.  Hematological: Negative  for adenopathy. Does not bruise/bleed easily.  Psychiatric/Behavioral: Negative for suicidal ideas, confusion, sleep disturbance, self-injury, dysphoric mood, decreased concentration and agitation.       Objective:    BP 130/82  Pulse 58  Temp(Src) 98.2 F (36.8 C) (Oral)  Ht 5' 5.2" (1.656 m)  Wt 131 lb 3.2 oz (59.512 kg)   BMI 21.70 kg/m2  SpO2 94%  LMP 10/06/2013 General appearance: alert, cooperative, appears stated age and no distress Head: Normocephalic, without obvious abnormality, atraumatic Eyes: conjunctivae/corneas clear. PERRL, EOM's intact. Fundi benign. Ears: normal TM's and external ear canals both ears Nose: Nares normal. Septum midline. Mucosa normal. No drainage or sinus tenderness. Throat: lips, mucosa, and tongue normal; teeth and gums normal Neck: no adenopathy, no carotid bruit, no JVD, supple, symmetrical, trachea midline and thyroid not enlarged, symmetric, no tenderness/mass/nodules Back: symmetric, no curvature. ROM normal. No CVA tenderness. Lungs: clear to auscultation bilaterally Breasts: normal appearance, no masses or tenderness Heart: regular rate and rhythm, S1, S2 normal, no murmur, click, rub or gallop Abdomen: soft, non-tender; bowel sounds normal; no masses,  no organomegaly Pelvic: deferred Extremities: extremities normal, atraumatic, no cyanosis or edema Pulses: 2+ and symmetric Skin: Skin color, texture, turgor normal. No rashes or lesions Lymph nodes: Cervical, supraclavicular, and axillary nodes normal. Neurologic: Alert and oriented X 3, normal strength and tone. Normal symmetric reflexes. Normal coordination and gait Psych-- no depression, no anxiety      Assessment:    Healthy female exam.     Plan:  ghm utd Check labs   See After Visit Summary for Counseling Recommendations

## 2013-10-11 NOTE — Patient Instructions (Signed)
Preventive Care for Adults A healthy lifestyle and preventive care can promote health and wellness. Preventive health guidelines for women include the following key practices.  A routine yearly physical is a good way to check with your health care provider about your health and preventive screening. It is a chance to share any concerns and updates on your health and to receive a thorough exam.  Visit your dentist for a routine exam and preventive care every 6 months. Brush your teeth twice a day and floss once a day. Good oral hygiene prevents tooth decay and gum disease.  The frequency of eye exams is based on your age, health, family medical history, use of contact lenses, and other factors. Follow your health care provider's recommendations for frequency of eye exams.  Eat a healthy diet. Foods like vegetables, fruits, whole grains, low-fat dairy products, and lean protein foods contain the nutrients you need without too many calories. Decrease your intake of foods high in solid fats, added sugars, and salt. Eat the right amount of calories for you.Get information about a proper diet from your health care provider, if necessary.  Regular physical exercise is one of the most important things you can do for your health. Most adults should get at least 150 minutes of moderate-intensity exercise (any activity that increases your heart rate and causes you to sweat) each week. In addition, most adults need muscle-strengthening exercises on 2 or more days a week.  Maintain a healthy weight. The body mass index (BMI) is a screening tool to identify possible weight problems. It provides an estimate of body fat based on height and weight. Your health care provider can find your BMI and can help you achieve or maintain a healthy weight.For adults 20 years and older:  A BMI below 18.5 is considered underweight.  A BMI of 18.5 to 24.9 is normal.  A BMI of 25 to 29.9 is considered overweight.  A BMI of  30 and above is considered obese.  Maintain normal blood lipids and cholesterol levels by exercising and minimizing your intake of saturated fat. Eat a balanced diet with plenty of fruit and vegetables. Blood tests for lipids and cholesterol should begin at age 76 and be repeated every 5 years. If your lipid or cholesterol levels are high, you are over 50, or you are at high risk for heart disease, you may need your cholesterol levels checked more frequently.Ongoing high lipid and cholesterol levels should be treated with medicines if diet and exercise are not working.  If you smoke, find out from your health care provider how to quit. If you do not use tobacco, do not start.  Lung cancer screening is recommended for adults aged 22-80 years who are at high risk for developing lung cancer because of a history of smoking. A yearly low-dose CT scan of the lungs is recommended for people who have at least a 30-pack-year history of smoking and are a current smoker or have quit within the past 15 years. A pack year of smoking is smoking an average of 1 pack of cigarettes a day for 1 year (for example: 1 pack a day for 30 years or 2 packs a day for 15 years). Yearly screening should continue until the smoker has stopped smoking for at least 15 years. Yearly screening should be stopped for people who develop a health problem that would prevent them from having lung cancer treatment.  If you are pregnant, do not drink alcohol. If you are breastfeeding,  be very cautious about drinking alcohol. If you are not pregnant and choose to drink alcohol, do not have more than 1 drink per day. One drink is considered to be 12 ounces (355 mL) of beer, 5 ounces (148 mL) of wine, or 1.5 ounces (44 mL) of liquor.  Avoid use of street drugs. Do not share needles with anyone. Ask for help if you need support or instructions about stopping the use of drugs.  High blood pressure causes heart disease and increases the risk of  stroke. Your blood pressure should be checked at least every 1 to 2 years. Ongoing high blood pressure should be treated with medicines if weight loss and exercise do not work.  If you are 75-52 years old, ask your health care provider if you should take aspirin to prevent strokes.  Diabetes screening involves taking a blood sample to check your fasting blood sugar level. This should be done once every 3 years, after age 15, if you are within normal weight and without risk factors for diabetes. Testing should be considered at a younger age or be carried out more frequently if you are overweight and have at least 1 risk factor for diabetes.  Breast cancer screening is essential preventive care for women. You should practice "breast self-awareness." This means understanding the normal appearance and feel of your breasts and may include breast self-examination. Any changes detected, no matter how small, should be reported to a health care provider. Women in their 58s and 30s should have a clinical breast exam (CBE) by a health care provider as part of a regular health exam every 1 to 3 years. After age 16, women should have a CBE every year. Starting at age 53, women should consider having a mammogram (breast X-ray test) every year. Women who have a family history of breast cancer should talk to their health care provider about genetic screening. Women at a high risk of breast cancer should talk to their health care providers about having an MRI and a mammogram every year.  Breast cancer gene (BRCA)-related cancer risk assessment is recommended for women who have family members with BRCA-related cancers. BRCA-related cancers include breast, ovarian, tubal, and peritoneal cancers. Having family members with these cancers may be associated with an increased risk for harmful changes (mutations) in the breast cancer genes BRCA1 and BRCA2. Results of the assessment will determine the need for genetic counseling and  BRCA1 and BRCA2 testing.  Routine pelvic exams to screen for cancer are no longer recommended for nonpregnant women who are considered low risk for cancer of the pelvic organs (ovaries, uterus, and vagina) and who do not have symptoms. Ask your health care provider if a screening pelvic exam is right for you.  If you have had past treatment for cervical cancer or a condition that could lead to cancer, you need Pap tests and screening for cancer for at least 20 years after your treatment. If Pap tests have been discontinued, your risk factors (such as having a new sexual partner) need to be reassessed to determine if screening should be resumed. Some women have medical problems that increase the chance of getting cervical cancer. In these cases, your health care provider may recommend more frequent screening and Pap tests.  The HPV test is an additional test that may be used for cervical cancer screening. The HPV test looks for the virus that can cause the cell changes on the cervix. The cells collected during the Pap test can be  tested for HPV. The HPV test could be used to screen women aged 30 years and older, and should be used in women of any age who have unclear Pap test results. After the age of 30, women should have HPV testing at the same frequency as a Pap test.  Colorectal cancer can be detected and often prevented. Most routine colorectal cancer screening begins at the age of 50 years and continues through age 75 years. However, your health care provider may recommend screening at an earlier age if you have risk factors for colon cancer. On a yearly basis, your health care provider may provide home test kits to check for hidden blood in the stool. Use of a small camera at the end of a tube, to directly examine the colon (sigmoidoscopy or colonoscopy), can detect the earliest forms of colorectal cancer. Talk to your health care provider about this at age 50, when routine screening begins. Direct  exam of the colon should be repeated every 5-10 years through age 75 years, unless early forms of pre-cancerous polyps or small growths are found.  People who are at an increased risk for hepatitis B should be screened for this virus. You are considered at high risk for hepatitis B if:  You were born in a country where hepatitis B occurs often. Talk with your health care provider about which countries are considered high risk.  Your parents were born in a high-risk country and you have not received a shot to protect against hepatitis B (hepatitis B vaccine).  You have HIV or AIDS.  You use needles to inject street drugs.  You live with, or have sex with, someone who has hepatitis B.  You get hemodialysis treatment.  You take certain medicines for conditions like cancer, organ transplantation, and autoimmune conditions.  Hepatitis C blood testing is recommended for all people born from 1945 through 1965 and any individual with known risks for hepatitis C.  Practice safe sex. Use condoms and avoid high-risk sexual practices to reduce the spread of sexually transmitted infections (STIs). STIs include gonorrhea, chlamydia, syphilis, trichomonas, herpes, HPV, and human immunodeficiency virus (HIV). Herpes, HIV, and HPV are viral illnesses that have no cure. They can result in disability, cancer, and death.  You should be screened for sexually transmitted illnesses (STIs) including gonorrhea and chlamydia if:  You are sexually active and are younger than 24 years.  You are older than 24 years and your health care provider tells you that you are at risk for this type of infection.  Your sexual activity has changed since you were last screened and you are at an increased risk for chlamydia or gonorrhea. Ask your health care provider if you are at risk.  If you are at risk of being infected with HIV, it is recommended that you take a prescription medicine daily to prevent HIV infection. This is  called preexposure prophylaxis (PrEP). You are considered at risk if:  You are a heterosexual woman, are sexually active, and are at increased risk for HIV infection.  You take drugs by injection.  You are sexually active with a partner who has HIV.  Talk with your health care provider about whether you are at high risk of being infected with HIV. If you choose to begin PrEP, you should first be tested for HIV. You should then be tested every 3 months for as long as you are taking PrEP.  Osteoporosis is a disease in which the bones lose minerals and strength   with aging. This can result in serious bone fractures or breaks. The risk of osteoporosis can be identified using a bone density scan. Women ages 65 years and over and women at risk for fractures or osteoporosis should discuss screening with their health care providers. Ask your health care provider whether you should take a calcium supplement or vitamin D to reduce the rate of osteoporosis.  Menopause can be associated with physical symptoms and risks. Hormone replacement therapy is available to decrease symptoms and risks. You should talk to your health care provider about whether hormone replacement therapy is right for you.  Use sunscreen. Apply sunscreen liberally and repeatedly throughout the day. You should seek shade when your shadow is shorter than you. Protect yourself by wearing long sleeves, pants, a wide-brimmed hat, and sunglasses year round, whenever you are outdoors.  Once a month, do a whole body skin exam, using a mirror to look at the skin on your back. Tell your health care provider of new moles, moles that have irregular borders, moles that are larger than a pencil eraser, or moles that have changed in shape or color.  Stay current with required vaccines (immunizations).  Influenza vaccine. All adults should be immunized every year.  Tetanus, diphtheria, and acellular pertussis (Td, Tdap) vaccine. Pregnant women should  receive 1 dose of Tdap vaccine during each pregnancy. The dose should be obtained regardless of the length of time since the last dose. Immunization is preferred during the 27th-36th week of gestation. An adult who has not previously received Tdap or who does not know her vaccine status should receive 1 dose of Tdap. This initial dose should be followed by tetanus and diphtheria toxoids (Td) booster doses every 10 years. Adults with an unknown or incomplete history of completing a 3-dose immunization series with Td-containing vaccines should begin or complete a primary immunization series including a Tdap dose. Adults should receive a Td booster every 10 years.  Varicella vaccine. An adult without evidence of immunity to varicella should receive 2 doses or a second dose if she has previously received 1 dose. Pregnant females who do not have evidence of immunity should receive the first dose after pregnancy. This first dose should be obtained before leaving the health care facility. The second dose should be obtained 4-8 weeks after the first dose.  Human papillomavirus (HPV) vaccine. Females aged 13-26 years who have not received the vaccine previously should obtain the 3-dose series. The vaccine is not recommended for use in pregnant females. However, pregnancy testing is not needed before receiving a dose. If a female is found to be pregnant after receiving a dose, no treatment is needed. In that case, the remaining doses should be delayed until after the pregnancy. Immunization is recommended for any person with an immunocompromised condition through the age of 26 years if she did not get any or all doses earlier. During the 3-dose series, the second dose should be obtained 4-8 weeks after the first dose. The third dose should be obtained 24 weeks after the first dose and 16 weeks after the second dose.  Zoster vaccine. One dose is recommended for adults aged 60 years or older unless certain conditions are  present.  Measles, mumps, and rubella (MMR) vaccine. Adults born before 1957 generally are considered immune to measles and mumps. Adults born in 1957 or later should have 1 or more doses of MMR vaccine unless there is a contraindication to the vaccine or there is laboratory evidence of immunity to   each of the three diseases. A routine second dose of MMR vaccine should be obtained at least 28 days after the first dose for students attending postsecondary schools, health care workers, or international travelers. People who received inactivated measles vaccine or an unknown type of measles vaccine during 1963-1967 should receive 2 doses of MMR vaccine. People who received inactivated mumps vaccine or an unknown type of mumps vaccine before 1979 and are at high risk for mumps infection should consider immunization with 2 doses of MMR vaccine. For females of childbearing age, rubella immunity should be determined. If there is no evidence of immunity, females who are not pregnant should be vaccinated. If there is no evidence of immunity, females who are pregnant should delay immunization until after pregnancy. Unvaccinated health care workers born before 1957 who lack laboratory evidence of measles, mumps, or rubella immunity or laboratory confirmation of disease should consider measles and mumps immunization with 2 doses of MMR vaccine or rubella immunization with 1 dose of MMR vaccine.  Pneumococcal 13-valent conjugate (PCV13) vaccine. When indicated, a person who is uncertain of her immunization history and has no record of immunization should receive the PCV13 vaccine. An adult aged 19 years or older who has certain medical conditions and has not been previously immunized should receive 1 dose of PCV13 vaccine. This PCV13 should be followed with a dose of pneumococcal polysaccharide (PPSV23) vaccine. The PPSV23 vaccine dose should be obtained at least 8 weeks after the dose of PCV13 vaccine. An adult aged 19  years or older who has certain medical conditions and previously received 1 or more doses of PPSV23 vaccine should receive 1 dose of PCV13. The PCV13 vaccine dose should be obtained 1 or more years after the last PPSV23 vaccine dose.  Pneumococcal polysaccharide (PPSV23) vaccine. When PCV13 is also indicated, PCV13 should be obtained first. All adults aged 65 years and older should be immunized. An adult younger than age 65 years who has certain medical conditions should be immunized. Any person who resides in a nursing home or long-term care facility should be immunized. An adult smoker should be immunized. People with an immunocompromised condition and certain other conditions should receive both PCV13 and PPSV23 vaccines. People with human immunodeficiency virus (HIV) infection should be immunized as soon as possible after diagnosis. Immunization during chemotherapy or radiation therapy should be avoided. Routine use of PPSV23 vaccine is not recommended for American Indians, [place] Natives, or people younger than 65 years unless there are medical conditions that require PPSV23 vaccine. When indicated, people who have unknown immunization and have no record of immunization should receive PPSV23 vaccine. One-time revaccination 5 years after the first dose of PPSV23 is recommended for people aged 19-64 years who have chronic kidney failure, nephrotic syndrome, asplenia, or immunocompromised conditions. People who received 1-2 doses of PPSV23 before age 65 years should receive another dose of PPSV23 vaccine at age 65 years or later if at least 5 years have passed since the previous dose. Doses of PPSV23 are not needed for people immunized with PPSV23 at or after age 65 years.  Meningococcal vaccine. Adults with asplenia or persistent complement component deficiencies should receive 2 doses of quadrivalent meningococcal conjugate (MenACWY-D) vaccine. The doses should be obtained at least 2 months apart.  Microbiologists working with certain meningococcal bacteria, military recruits, people at risk during an outbreak, and people who travel to or live in countries with a high rate of meningitis should be immunized. A first-year college student up through age   21 years who is living in a residence hall should receive a dose if she did not receive a dose on or after her 16th birthday. Adults who have certain high-risk conditions should receive one or more doses of vaccine.  Hepatitis A vaccine. Adults who wish to be protected from this disease, have certain high-risk conditions, work with hepatitis A-infected animals, work in hepatitis A research labs, or travel to or work in countries with a high rate of hepatitis A should be immunized. Adults who were previously unvaccinated and who anticipate close contact with an international adoptee during the first 60 days after arrival in the [place] from a country with a high rate of hepatitis A should be immunized.  Hepatitis B vaccine. Adults who wish to be protected from this disease, have certain high-risk conditions, may be exposed to blood or other infectious body fluids, are household contacts or sex partners of hepatitis B positive people, are clients or workers in certain care facilities, or travel to or work in countries with a high rate of hepatitis B should be immunized.  Haemophilus influenzae type b (Hib) vaccine. A previously unvaccinated person with asplenia or sickle cell disease or having a scheduled splenectomy should receive 1 dose of Hib vaccine. Regardless of previous immunization, a recipient of a hematopoietic stem cell transplant should receive a 3-dose series 6-12 months after her successful transplant. Hib vaccine is not recommended for adults with HIV infection. Preventive Services / Frequency Ages 64 to 68 years  Blood pressure check.** / Every 1 to 2 years.  Lipid and cholesterol check.** / Every 5 years beginning at age  22.  Clinical breast exam.** / Every 3 years for women in their 88s and 53s.  BRCA-related cancer risk assessment.** / For women who have family members with a BRCA-related cancer (breast, ovarian, tubal, or peritoneal cancers).  Pap test.** / Every 2 years from ages 90 through 51. Every 3 years starting at age 21 through age 56 or 3 with a history of 3 consecutive normal Pap tests.  HPV screening.** / Every 3 years from ages 24 through ages 1 to 46 with a history of 3 consecutive normal Pap tests.  Hepatitis C blood test.** / For any individual with known risks for hepatitis C.  Skin self-exam. / Monthly.  Influenza vaccine. / Every year.  Tetanus, diphtheria, and acellular pertussis (Tdap, Td) vaccine.** / Consult your health care provider. Pregnant women should receive 1 dose of Tdap vaccine during each pregnancy. 1 dose of Td every 10 years.  Varicella vaccine.** / Consult your health care provider. Pregnant females who do not have evidence of immunity should receive the first dose after pregnancy.  HPV vaccine. / 3 doses over 6 months, if 72 and younger. The vaccine is not recommended for use in pregnant females. However, pregnancy testing is not needed before receiving a dose.  Measles, mumps, rubella (MMR) vaccine.** / You need at least 1 dose of MMR if you were born in 1957 or later. You may also need a 2nd dose. For females of childbearing age, rubella immunity should be determined. If there is no evidence of immunity, females who are not pregnant should be vaccinated. If there is no evidence of immunity, females who are pregnant should delay immunization until after pregnancy.  Pneumococcal 13-valent conjugate (PCV13) vaccine.** / Consult your health care provider.  Pneumococcal polysaccharide (PPSV23) vaccine.** / 1 to 2 doses if you smoke cigarettes or if you have certain conditions.  Meningococcal vaccine.** /  1 dose if you are age 19 to 21 years and a first-year college  student living in a residence hall, or have one of several medical conditions, you need to get vaccinated against meningococcal disease. You may also need additional booster doses.  Hepatitis A vaccine.** / Consult your health care provider.  Hepatitis B vaccine.** / Consult your health care provider.  Haemophilus influenzae type b (Hib) vaccine.** / Consult your health care provider. Ages 40 to 64 years  Blood pressure check.** / Every 1 to 2 years.  Lipid and cholesterol check.** / Every 5 years beginning at age 20 years.  Lung cancer screening. / Every year if you are aged 55-80 years and have a 30-pack-year history of smoking and currently smoke or have quit within the past 15 years. Yearly screening is stopped once you have quit smoking for at least 15 years or develop a health problem that would prevent you from having lung cancer treatment.  Clinical breast exam.** / Every year after age 40 years.  BRCA-related cancer risk assessment.** / For women who have family members with a BRCA-related cancer (breast, ovarian, tubal, or peritoneal cancers).  Mammogram.** / Every year beginning at age 40 years and continuing for as long as you are in good health. Consult with your health care provider.  Pap test.** / Every 3 years starting at age 30 years through age 65 or 70 years with a history of 3 consecutive normal Pap tests.  HPV screening.** / Every 3 years from ages 30 years through ages 65 to 70 years with a history of 3 consecutive normal Pap tests.  Fecal occult blood test (FOBT) of stool. / Every year beginning at age 50 years and continuing until age 75 years. You may not need to do this test if you get a colonoscopy every 10 years.  Flexible sigmoidoscopy or colonoscopy.** / Every 5 years for a flexible sigmoidoscopy or every 10 years for a colonoscopy beginning at age 50 years and continuing until age 75 years.  Hepatitis C blood test.** / For all people born from 1945 through  1965 and any individual with known risks for hepatitis C.  Skin self-exam. / Monthly.  Influenza vaccine. / Every year.  Tetanus, diphtheria, and acellular pertussis (Tdap/Td) vaccine.** / Consult your health care provider. Pregnant women should receive 1 dose of Tdap vaccine during each pregnancy. 1 dose of Td every 10 years.  Varicella vaccine.** / Consult your health care provider. Pregnant females who do not have evidence of immunity should receive the first dose after pregnancy.  Zoster vaccine.** / 1 dose for adults aged 60 years or older.  Measles, mumps, rubella (MMR) vaccine.** / You need at least 1 dose of MMR if you were born in 1957 or later. You may also need a 2nd dose. For females of childbearing age, rubella immunity should be determined. If there is no evidence of immunity, females who are not pregnant should be vaccinated. If there is no evidence of immunity, females who are pregnant should delay immunization until after pregnancy.  Pneumococcal 13-valent conjugate (PCV13) vaccine.** / Consult your health care provider.  Pneumococcal polysaccharide (PPSV23) vaccine.** / 1 to 2 doses if you smoke cigarettes or if you have certain conditions.  Meningococcal vaccine.** / Consult your health care provider.  Hepatitis A vaccine.** / Consult your health care provider.  Hepatitis B vaccine.** / Consult your health care provider.  Haemophilus influenzae type b (Hib) vaccine.** / Consult your health care provider. Ages 65   years and over  Blood pressure check.** / Every 1 to 2 years.  Lipid and cholesterol check.** / Every 5 years beginning at age 22 years.  Lung cancer screening. / Every year if you are aged 73-80 years and have a 30-pack-year history of smoking and currently smoke or have quit within the past 15 years. Yearly screening is stopped once you have quit smoking for at least 15 years or develop a health problem that would prevent you from having lung cancer  treatment.  Clinical breast exam.** / Every year after age 4 years.  BRCA-related cancer risk assessment.** / For women who have family members with a BRCA-related cancer (breast, ovarian, tubal, or peritoneal cancers).  Mammogram.** / Every year beginning at age 40 years and continuing for as long as you are in good health. Consult with your health care provider.  Pap test.** / Every 3 years starting at age 9 years through age 34 or 91 years with 3 consecutive normal Pap tests. Testing can be stopped between 65 and 70 years with 3 consecutive normal Pap tests and no abnormal Pap or HPV tests in the past 10 years.  HPV screening.** / Every 3 years from ages 57 years through ages 64 or 45 years with a history of 3 consecutive normal Pap tests. Testing can be stopped between 65 and 70 years with 3 consecutive normal Pap tests and no abnormal Pap or HPV tests in the past 10 years.  Fecal occult blood test (FOBT) of stool. / Every year beginning at age 15 years and continuing until age 17 years. You may not need to do this test if you get a colonoscopy every 10 years.  Flexible sigmoidoscopy or colonoscopy.** / Every 5 years for a flexible sigmoidoscopy or every 10 years for a colonoscopy beginning at age 86 years and continuing until age 71 years.  Hepatitis C blood test.** / For all people born from [birth year] through 1964 and any individual with known risks for hepatitis C.  Osteoporosis screening.** / A one-time screening for women ages 83 years and over and women at risk for fractures or osteoporosis.  Skin self-exam. / Monthly.  Influenza vaccine. / Every year.  Tetanus, diphtheria, and acellular pertussis (Tdap/Td) vaccine.** / 1 dose of Td every 10 years.  Varicella vaccine.** / Consult your health care provider.  Zoster vaccine.** / 1 dose for adults aged 61 years or older.  Pneumococcal 13-valent conjugate (PCV13) vaccine.** / Consult your health care provider.  Pneumococcal  polysaccharide (PPSV23) vaccine.** / 1 dose for all adults aged 28 years and older.  Meningococcal vaccine.** / Consult your health care provider.  Hepatitis A vaccine.** / Consult your health care provider.  Hepatitis B vaccine.** / Consult your health care provider.  Haemophilus influenzae type b (Hib) vaccine.** / Consult your health care provider. ** Family history and personal history of risk and conditions may change your health care provider's recommendations. Document Released: 03/31/2001 Document Revised: 06/19/2013 Document Reviewed: 06/30/2010 [facility] Patient Information 2015 [place], [place]. This information is not intended to replace advice given to you by your health care provider. Make sure you discuss any questions you have with your health care provider.

## 2013-10-11 NOTE — Progress Notes (Signed)
Pre visit review using our clinic review tool, if applicable. No additional management support is needed unless otherwise documented below in the visit note. 

## 2013-10-12 LAB — POCT URINALYSIS DIPSTICK
BILIRUBIN UA: NEGATIVE
Glucose, UA: NEGATIVE
Ketones, UA: NEGATIVE
LEUKOCYTES UA: NEGATIVE
Nitrite, UA: NEGATIVE
PH UA: 6
Protein, UA: NEGATIVE
RBC UA: NEGATIVE
Spec Grav, UA: 1.015
Urobilinogen, UA: 0.2

## 2013-12-13 ENCOUNTER — Other Ambulatory Visit: Payer: Self-pay | Admitting: Family Medicine

## 2014-02-12 ENCOUNTER — Other Ambulatory Visit: Payer: Self-pay | Admitting: Family Medicine

## 2014-08-08 ENCOUNTER — Encounter: Payer: Self-pay | Admitting: Internal Medicine

## 2014-08-08 ENCOUNTER — Other Ambulatory Visit: Payer: Self-pay | Admitting: Internal Medicine

## 2014-08-08 ENCOUNTER — Telehealth: Payer: Self-pay

## 2014-08-08 DIAGNOSIS — K30 Functional dyspepsia: Secondary | ICD-10-CM

## 2014-08-08 DIAGNOSIS — K219 Gastro-esophageal reflux disease without esophagitis: Secondary | ICD-10-CM

## 2014-08-08 MED ORDER — DEXLANSOPRAZOLE 30 MG PO CPDR: 30.0000 mg | DELAYED_RELEASE_CAPSULE | Freq: Every day | ORAL | Status: DC

## 2014-08-08 NOTE — Assessment & Plan Note (Signed)
Will try to reduce and then taper Dexilant

## 2014-08-08 NOTE — Telephone Encounter (Signed)
  Did prior authorization form for Dexilant  For Functional dyspepsia , K30 is the ICD-10 code. Has tried Prilosec, Gavison, Tums.  Faxed form to [phone].  Will await outcome.

## 2014-08-08 NOTE — Assessment & Plan Note (Signed)
Has requested to reduce and stop Dexilant if possible. Will go to 30 mg daily and then coordinate a taper

## 2014-08-13 NOTE — Telephone Encounter (Signed)
Spoke with Elmyra Ricks at [employer] [phone] and she was able to approve patient's Dexilant and put an override in for that.  [facility] pharmacy notified.

## 2014-10-26 ENCOUNTER — Other Ambulatory Visit: Payer: BLUE CROSS/BLUE SHIELD

## 2014-10-26 ENCOUNTER — Ambulatory Visit (INDEPENDENT_AMBULATORY_CARE_PROVIDER_SITE_OTHER): Payer: BLUE CROSS/BLUE SHIELD | Admitting: Internal Medicine

## 2014-10-26 ENCOUNTER — Encounter: Payer: Self-pay | Admitting: Internal Medicine

## 2014-10-26 VITALS — BP 106/60 | HR 76 | Ht 65.0 in | Wt 137.0 lb

## 2014-10-26 DIAGNOSIS — K30 Functional dyspepsia: Secondary | ICD-10-CM | POA: Diagnosis not present

## 2014-10-26 DIAGNOSIS — K900 Celiac disease: Secondary | ICD-10-CM

## 2014-10-26 DIAGNOSIS — K219 Gastro-esophageal reflux disease without esophagitis: Secondary | ICD-10-CM

## 2014-10-26 MED ORDER — DEXLANSOPRAZOLE 30 MG PO CPDR: 30.0000 mg | DELAYED_RELEASE_CAPSULE | Freq: Every day | ORAL | Status: DC

## 2014-10-26 NOTE — Progress Notes (Signed)
   Subjective:    Patient ID: [name] [name], female    DOB: [birth year], 39 y.o.   MRN: [id number] Cc: f/u GERD/dyspepsia and celiac dz HPI Doing well overall except occ epigastric burning - started after decreasing dexilant to 30 mg qd. Gaviscon helps this Medications, allergies, past medical history, past surgical history, family history and social history are reviewed and updated in the EMR.   Review of Systems As above    Objective:   Physical Exam BP 106/60 mmHg  Pulse 76  Ht 5' 5"  (1.651 m)  Wt 137 lb (62.143 kg)  BMI 22.80 kg/m2 abd soft NT       Assessment & Plan:  GERD (gastroesophageal reflux disease) Dexilant and prn Gaviscon  CELIAC SPRUE TTG Ab f/u  Functional dyspepsia Dexilant  RTC 1 yr routine sooner prn

## 2014-10-26 NOTE — Patient Instructions (Addendum)
   Please head to lab in basement and we will check the celiac antibodies. Keep up the good work and I hope you stay well.  Unless there are problems see you in 1 year.  We have sent the following medications to your pharmacy for you to pick up at your convenience: dexilant  I appreciate the opportunity to care for you. Gatha Mayer, MD, [facility]

## 2014-10-26 NOTE — Assessment & Plan Note (Signed)
Dexilant 

## 2014-10-26 NOTE — Assessment & Plan Note (Signed)
TTG Ab f/u

## 2014-10-26 NOTE — Assessment & Plan Note (Signed)
Dexilant and prn Gaviscon

## 2014-10-29 LAB — TISSUE TRANSGLUTAMINASE, IGA: TISSUE TRANSGLUTAMINASE AB, IGA: 1 U/mL (ref <4)

## 2014-10-30 NOTE — Progress Notes (Signed)
Quick Note:  TTG Ab level is low - consistent with no gluten in diet ______

## 2014-12-11 ENCOUNTER — Other Ambulatory Visit: Payer: Self-pay | Admitting: Internal Medicine

## 2015-08-27 ENCOUNTER — Encounter: Payer: Self-pay | Admitting: Medical

## 2015-08-27 ENCOUNTER — Ambulatory Visit (INDEPENDENT_AMBULATORY_CARE_PROVIDER_SITE_OTHER): Payer: BLUE CROSS/BLUE SHIELD | Admitting: Medical

## 2015-08-27 VITALS — BP 110/68 | HR 71 | Temp 98.2°F | Ht 66.0 in | Wt 137.6 lb

## 2015-08-27 DIAGNOSIS — J029 Acute pharyngitis, unspecified: Secondary | ICD-10-CM

## 2015-08-27 LAB — POCT RAPID STREP A (OFFICE): Rapid Strep A Screen: NEGATIVE

## 2015-08-27 MED ORDER — AZITHROMYCIN 250 MG PO TABS: ORAL_TABLET | ORAL | Status: DC

## 2015-08-27 NOTE — Progress Notes (Signed)
Pre visit review using our clinic review tool, if applicable. No additional management support is needed unless otherwise documented below in the visit note. 

## 2015-08-27 NOTE — Patient Instructions (Signed)
Your strep test was negative. However, your physical exam and clinical presentation is suspicious for strep and it is important to note that rapid strep test can be falsely negative. So I am going to give you azithrmycin antibiotic today based on your exam and clinical presentation.  Rest hydrate, tylenol for fever, and warm salt water gargles.   Follow up in 7 days or as needed.

## 2015-08-27 NOTE — Addendum Note (Signed)
Addended by: Tasia Catchings on: 08/27/2015 01:07 PM   Modules accepted: Orders, SmartSet

## 2015-08-27 NOTE — Progress Notes (Signed)
Subjective:    Patient ID: [name] [name], female    DOB: 07-13-1975, 40 y.o.   MRN: [id number]  HPI  Pt in with st since Sunday night. Worse yesterday. Some low grade fever. Faint ha. No severe body aches. Pain in throat is gradually increasing. Pt [place] swim [place] this [place].  LMP- 2 wks ago.   Review of Systems  Constitutional: Positive for fever and fatigue. Negative for chills.  HENT: Positive for sore throat. Negative for congestion, mouth sores, nosebleeds, postnasal drip and sinus pressure.   Respiratory: Negative for cough.   Cardiovascular: Negative for chest pain.  Gastrointestinal: Negative for abdominal pain.  Neurological: Positive for headaches. Negative for dizziness and seizures.       Faint ha  Hematological: Positive for adenopathy. Does not bruise/bleed easily.  Psychiatric/Behavioral: Negative for behavioral problems.   Past Medical History  Diagnosis Date  . Thyroid disease   . Allergy   . Numbness in left leg     saw dr hopper w/ this  . Gastritis   . Basal cell carcinoma     abdomin  . Functional dyspepsia 09/05/2012  . Celiac disease 2009     Social History   Social History  . Marital Status: Married    Spouse Name: N/A  . Number of Children: 2  . Years of Education: N/A   Occupational History  . 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 Smoking status: Former Research scientist (life sciences)  . Smokeless tobacco: Never Used     Comment: About age 65   . Alcohol Use: Yes     Comment: Occ.  . Drug Use: No  . Sexual Activity:    Partners: Male   Other Topics Concern  . Not on file   Social History Narrative   Exercise-- 2-3 x a week     Past Surgical History  Procedure Laterality Date  . Cesarean section    . Wisdom tooth extraction    . Colonoscopy    . Esophagogastroduodenoscopy    . Skin cancer excision      abdomin    Family History  Problem Relation Age of Onset  . Leukemia Mother   . Hypertension Mother   . Stroke  Mother   . Cancer Mother 102    leukemia--cml  . Prostate cancer Father   . Prostate cancer Father 61  . Depression Father   . Hypertension      siblings  . Hypertension Sister   . Bipolar disorder Sister   . Stroke Maternal Grandmother   . Colon cancer Neg Hx   . Esophageal cancer Neg Hx   . Stomach cancer Neg Hx   . Rectal cancer Neg Hx     Allergies  Allergen Reactions  . Influenza Virus Vacc Split Pf     REACTION: pt had Joellyn Quails--- can not have flu shot    Current Outpatient Prescriptions on File Prior to Visit  Medication Sig Dispense Refill  . DEXILANT 30 MG capsule TAKE 1 CAPSULE BY MOUTH DAILY BEFORE BREAKFAST. 30 capsule 11  . Ferrous Sulfate (IRON) 325 (65 FE) MG TABS Take 65 mg by mouth daily.    . Multiple Vitamin (MULTIVITAMIN) tablet Take 1 tablet by mouth daily.    . Omega-3 Fatty Acids (FP FISH OIL PO) Take by mouth 1 day or 1 dose.    . Probiotic Product (PROBIOTIC ADVANCED PO) Take by mouth daily.  No current facility-administered medications on file prior to visit.    BP 110/68 mmHg  Pulse 71  Temp(Src) 98.2 F (36.8 C) (Oral)  Ht 5' 6"  (1.676 m)  Wt 137 lb 9.6 oz (62.415 kg)  BMI 22.22 kg/m2  SpO2 98%  LMP 08/13/2015       Objective:   Physical Exam   General  Mental Status - Alert. General Appearance - Well groomed. Not in acute distress.  Skin Rashes- No Rashes.  HEENT Head- Normal. Ear Auditory Canal - Left- Normal. Right - Normal.Tympanic Membrane- Left- Normal. Right- Normal. Eye Sclera/Conjunctiva- Left- Normal. Right- Normal. Nose & Sinuses Nasal Mucosa- Left-  No Boggy and Congested. Right- Not  Boggy and  Congested.Bilateral no  maxillary and  No frontal sinus pressure. Mouth & Throat Lips: Upper Lip- Normal: no dryness, cracking, pallor, cyanosis, or vesicular eruption. Lower Lip-Normal: no dryness, cracking, pallor, cyanosis or vesicular eruption. Buccal Mucosa- Bilateral- No Aphthous ulcers. Oropharynx- No  Discharge or Erythema. Tonsils: Characteristics- Bilateral-  Erythema and  Congestion. Size/Enlargement- Bilateral- sub mandiblar node enlargement. Discharge- bilateral-None.  Neck Neck- Supple. No Masses. Mild enlarged bilateral submandibular nodes. Left>than right side. No neck stiffness   Chest and Lung Exam Auscultation: Breath Sounds:-Clear even and unlabored.  Cardiovascular Auscultation:Rythm- Regular, rate and rhythm. Murmurs & Other Heart Sounds:Ausculatation of the heart reveal- No Murmurs.  Lymphatic Head & Neck General Head & Neck Lymphatics: Bilateral: Description- No Localized lymphadenopathy. See neck exam.  Neuro- Cn III-XII grossly intact.        Assessment & Plan:  Your strep test was negative. However, your physical exam and clinical presentation is suspicious for strep and it is important to note that rapid strep test can be falsely negative. So I am going to give you azithrmycin antibiotic today based on your exam and clinical presentation.  Rest hydrate, tylenol for fever, and warm salt water gargles.   Follow up in 7 days or as needed.   Johnmatthew Solorio, Percell Miller, PA-C

## 2015-12-09 ENCOUNTER — Other Ambulatory Visit: Payer: Self-pay | Admitting: Internal Medicine

## 2016-01-13 ENCOUNTER — Other Ambulatory Visit: Payer: Self-pay | Admitting: Internal Medicine

## 2016-01-17 ENCOUNTER — Telehealth: Payer: Self-pay | Admitting: Internal Medicine

## 2016-01-17 NOTE — Telephone Encounter (Signed)
Please advise if ok Sir, thank you.

## 2016-01-17 NOTE — Telephone Encounter (Signed)
OK x 1 year

## 2016-01-20 MED ORDER — DEXLANSOPRAZOLE 30 MG PO CPDR: DELAYED_RELEASE_CAPSULE | ORAL | 3 refills | Status: DC

## 2016-01-20 NOTE — Telephone Encounter (Signed)
Refill sent in as requested, ok per Dr Carlean Purl.

## 2016-02-12 ENCOUNTER — Other Ambulatory Visit: Payer: Self-pay | Admitting: Internal Medicine

## 2017-01-19 ENCOUNTER — Encounter: Payer: Self-pay | Admitting: Family Medicine

## 2017-01-19 ENCOUNTER — Ambulatory Visit (INDEPENDENT_AMBULATORY_CARE_PROVIDER_SITE_OTHER): Payer: BLUE CROSS/BLUE SHIELD | Admitting: Family Medicine

## 2017-01-19 ENCOUNTER — Other Ambulatory Visit (HOSPITAL_COMMUNITY)
Admission: RE | Admit: 2017-01-19 | Discharge: 2017-01-19 | Disposition: A | Discharge status: Home / Self Care | Payer: BLUE CROSS/BLUE SHIELD | Source: Ambulatory Visit | Attending: Family Medicine | Admitting: Family Medicine

## 2017-01-19 VITALS — BP 122/70 | HR 62 | Temp 98.0°F | Resp 16 | Ht 66.0 in | Wt 143.2 lb

## 2017-01-19 DIAGNOSIS — Z124 Encounter for screening for malignant neoplasm of cervix: Secondary | ICD-10-CM | POA: Insufficient documentation

## 2017-01-19 DIAGNOSIS — Z114 Encounter for screening for human immunodeficiency virus [HIV]: Secondary | ICD-10-CM | POA: Diagnosis not present

## 2017-01-19 DIAGNOSIS — Z0000 Encounter for general adult medical examination without abnormal findings: Secondary | ICD-10-CM

## 2017-01-19 LAB — POC URINALSYSI DIPSTICK (AUTOMATED)
Bilirubin, UA: NEGATIVE
Glucose, UA: NEGATIVE
LEUKOCYTES UA: NEGATIVE
Nitrite, UA: NEGATIVE
PH UA: 6 (ref 5.0–8.0)
PROTEIN UA: NEGATIVE
RBC UA: NEGATIVE
Spec Grav, UA: 1.015 (ref 1.010–1.025)
Urobilinogen, UA: 0.2 E.U./dL

## 2017-01-19 NOTE — Patient Instructions (Signed)
Preventive Care 40-64 Years, Female Preventive care refers to lifestyle choices and visits with your health care provider that can promote health and wellness. What does preventive care include?  A yearly physical exam. This is also called an annual well check.  Dental exams once or twice a year.  Routine eye exams. Ask your health care provider how often you should have your eyes checked.  Personal lifestyle choices, including: ? Daily care of your teeth and gums. ? Regular physical activity. ? Eating a healthy diet. ? Avoiding tobacco and drug use. ? Limiting alcohol use. ? Practicing safe sex. ? Taking low-dose aspirin daily starting at age 58. ? Taking vitamin and mineral supplements as recommended by your health care provider. What happens during an annual well check? The services and screenings done by your health care provider during your annual well check will depend on your age, overall health, lifestyle risk factors, and family history of disease. Counseling Your health care provider may ask you questions about your:  Alcohol use.  Tobacco use.  Drug use.  Emotional well-being.  Home and relationship well-being.  Sexual activity.  Eating habits.  Work and work Statistician.  Method of birth control.  Menstrual cycle.  Pregnancy history.  Screening You may have the following tests or measurements:  Height, weight, and BMI.  Blood pressure.  Lipid and cholesterol levels. These may be checked every 5 years, or more frequently if you are over 81 years old.  Skin check.  Lung cancer screening. You may have this screening every year starting at age 78 if you have a 30-pack-year history of smoking and currently smoke or have quit within the past 15 years.  Fecal occult blood test (FOBT) of the stool. You may have this test every year starting at age 65.  Flexible sigmoidoscopy or colonoscopy. You may have a sigmoidoscopy every 5 years or a colonoscopy  every 10 years starting at age 30.  Hepatitis C blood test.  Hepatitis B blood test.  Sexually transmitted disease (STD) testing.  Diabetes screening. This is done by checking your blood sugar (glucose) after you have not eaten for a while (fasting). You may have this done every 1-3 years.  Mammogram. This may be done every 1-2 years. Talk to your health care provider about when you should start having regular mammograms. This may depend on whether you have a family history of breast cancer.  BRCA-related cancer screening. This may be done if you have a family history of breast, ovarian, tubal, or peritoneal cancers.  Pelvic exam and Pap test. This may be done every 3 years starting at age 80. Starting at age 36, this may be done every 5 years if you have a Pap test in combination with an HPV test.  Bone density scan. This is done to screen for osteoporosis. You may have this scan if you are at high risk for osteoporosis.  Discuss your test results, treatment options, and if necessary, the need for more tests with your health care provider. Vaccines Your health care provider may recommend certain vaccines, such as:  Influenza vaccine. This is recommended every year.  Tetanus, diphtheria, and acellular pertussis (Tdap, Td) vaccine. You may need a Td booster every 10 years.  Varicella vaccine. You may need this if you have not been vaccinated.  Zoster vaccine. You may need this after age 5.  Measles, mumps, and rubella (MMR) vaccine. You may need at least one dose of MMR if you were born in  1957 or later. You may also need a second dose.  Pneumococcal 13-valent conjugate (PCV13) vaccine. You may need this if you have certain conditions and were not previously vaccinated.  Pneumococcal polysaccharide (PPSV23) vaccine. You may need one or two doses if you smoke cigarettes or if you have certain conditions.  Meningococcal vaccine. You may need this if you have certain  conditions.  Hepatitis A vaccine. You may need this if you have certain conditions or if you travel or work in places where you may be exposed to hepatitis A.  Hepatitis B vaccine. You may need this if you have certain conditions or if you travel or work in places where you may be exposed to hepatitis B.  Haemophilus influenzae type b (Hib) vaccine. You may need this if you have certain conditions.  Talk to your health care provider about which screenings and vaccines you need and how often you need them. This information is not intended to replace advice given to you by your health care provider. Make sure you discuss any questions you have with your health care provider. Document Released: 03/01/2015 Document Revised: 10/23/2015 Document Reviewed: 12/04/2014 Elsevier Interactive Patient Education  2017 [employer].

## 2017-01-19 NOTE — Progress Notes (Signed)
Subjective:     [name] [name] is a 41 y.o. female and is here for a comprehensive physical exam. The patient reports no problems.  Social History   Socioeconomic History  . Marital status: Married    Spouse name: Not on file  . Number of children: 2  . Years of education: Not on file  . Highest education level: Not on file  Social Needs  . Financial resource strain: Not on file  . Food insecurity - worry: Not on file  . Food insecurity - inability: Not on file  . Transportation needs - medical: Not on file  . Transportation needs - non-medical: Not on file  Occupational History  . Occupation: Doctor, general practice  W/S    Employer: [employer]  Tobacco Use  . Smoking status: Former Smoker    Last attempt to quit: 01/19/1998    Years since quitting: 19.0  . Smokeless tobacco: Never Used  . Tobacco comment: About age 58   Substance and Sexual Activity  . Alcohol use: Yes    Comment: Occ.  . Drug use: No  . Sexual activity: Yes    Partners: Male  Other Topics Concern  . Not on file  Social History Narrative   Exercise-- 2 x a week    Health Maintenance  Topic Date Due  . HIV Screening  06/28/1991  . PAP SMEAR  08/09/2015  . TETANUS/TDAP  05/23/2019    The following portions of the patient's history were reviewed and updated as appropriate:  She  has a past medical history of Allergy, Basal cell carcinoma, Celiac disease (2009), Functional dyspepsia (09/05/2012), Gastritis, Numbness in left leg, and Thyroid disease. She does not have any pertinent problems on file. She  has a past surgical history that includes Cesarean section; Wisdom tooth extraction; Colonoscopy; Esophagogastroduodenoscopy; and Skin cancer excision. Her family history includes Bipolar disorder in her sister; Cancer (age of onset: 19) in her mother; Depression in her father; Hypertension in her mother, sister, and unknown relative; Leukemia in her mother; Prostate cancer (age of onset: 85) in her father;  Stroke in her maternal grandmother and mother. She  reports that she quit smoking about 19 years ago. she has never used smokeless tobacco. She reports that she drinks alcohol. She reports that she does not use drugs. She has a current medication list which includes the following prescription(s): dexlansoprazole, iron, multivitamin, omega-3 fatty acids, and probiotic product. Current Outpatient Medications on File Prior to Visit  Medication Sig Dispense Refill  . Dexlansoprazole (DEXILANT) 30 MG capsule TAKE 1 CAPSULE BY MOUTH DAILY BEFORE BREAKFAST. 90 capsule 3  . Ferrous Sulfate (IRON) 325 (65 FE) MG TABS Take 65 mg by mouth daily.    . Multiple Vitamin (MULTIVITAMIN) tablet Take 1 tablet by mouth daily.    . Omega-3 Fatty Acids (FP FISH OIL PO) Take by mouth 1 day or 1 dose.    . Probiotic Product (PROBIOTIC ADVANCED PO) Take by mouth daily.     No current facility-administered medications on file prior to visit.    She is allergic to influenza virus vacc split pf..  Review of Systems Review of Systems  Constitutional: Negative for activity change, appetite change and fatigue.  HENT: Negative for hearing loss, congestion, tinnitus and ear discharge.  dentist q53mEyes: Negative for visual disturbance (see optho q1y -- vision corrected to 20/20 with glasses).  Respiratory: Negative for cough, chest tightness and shortness of breath.   Cardiovascular: Negative for chest pain,  palpitations and leg swelling.  Gastrointestinal: Negative for abdominal pain, diarrhea, constipation and abdominal distention.  Genitourinary: Negative for urgency, frequency, decreased urine volume and difficulty urinating.  Musculoskeletal: Negative for back pain, arthralgias and gait problem.  Skin: Negative for color change, pallor and rash.  Neurological: Negative for dizziness, light-headedness, numbness and headaches.  Hematological: Negative for adenopathy. Does not bruise/bleed easily.   Psychiatric/Behavioral: Negative for suicidal ideas, confusion, sleep disturbance, self-injury, dysphoric mood, decreased concentration and agitation.       Objective:    BP 122/70 (BP Location: Right Arm, Cuff Size: Normal)   Pulse 62   Temp 98 F (36.7 C) (Oral)   Resp 16   Ht 5' 6"  (1.676 m)   Wt 143 lb 3.2 oz (65 kg)   LMP 01/12/2017   SpO2 98%   BMI 23.11 kg/m  General appearance: alert, cooperative, appears stated age and no distress Head: Normocephalic, without obvious abnormality, atraumatic Eyes: conjunctivae/corneas clear. PERRL, EOM's intact. Fundi benign. Ears: normal TM's and external ear canals both ears Nose: Nares normal. Septum midline. Mucosa normal. No drainage or sinus tenderness. Throat: lips, mucosa, and tongue normal; teeth and gums normal Neck: no adenopathy, no carotid bruit, no JVD, supple, symmetrical, trachea midline and thyroid not enlarged, symmetric, no tenderness/mass/nodules Back: symmetric, no curvature. ROM normal. No CVA tenderness. Lungs: clear to auscultation bilaterally Breasts: normal appearance, no masses or tenderness Heart: S1, S2 normal Abdomen: soft, non-tender; bowel sounds normal; no masses,  no organomegaly Pelvic: cervix normal in appearance, external genitalia normal, no adnexal masses or tenderness, no cervical motion tenderness, rectovaginal septum normal, uterus normal size, shape, and consistency, vagina normal without discharge and pap done,  Extremities: extremities normal, atraumatic, no cyanosis or edema Pulses: 2+ and symmetric Skin: Skin color, texture, turgor normal. No rashes or lesions Lymph nodes: Cervical, supraclavicular, and axillary nodes normal. Neurologic: Alert and oriented X 3, normal strength and tone. Normal symmetric reflexes. Normal coordination and gait    Assessment:    Healthy female exam.      Plan:    ghm utd Check labs See After Visit Summary for Counseling Recommendations    1.  Screening for HIV without presence of risk factors   - HIV antibody  2. Preventative health care See above - POCT Urinalysis Dipstick (Automated) - CBC with Differential/Platelet - Comprehensive metabolic panel - Lipid panel - TSH - Fecal occult blood, imunochemical; Future  3. Cervical cancer screening    - Cytology - PAP

## 2017-01-20 LAB — COMPREHENSIVE METABOLIC PANEL
ALBUMIN: 4.8 g/dL (ref 3.5–5.2)
ALT: 13 U/L (ref 0–35)
AST: 17 U/L (ref 0–37)
Alkaline Phosphatase: 37 U/L — ABNORMAL LOW (ref 39–117)
BUN: 11 mg/dL (ref 6–23)
CO2: 29 mEq/L (ref 19–32)
CREATININE: 0.95 mg/dL (ref 0.40–1.20)
Calcium: 9 mg/dL (ref 8.4–10.5)
Chloride: 103 mEq/L (ref 96–112)
GFR: 69.05 mL/min (ref >60.00)
GLUCOSE: 64 mg/dL — AB (ref 70–99)
Potassium: 4.2 mEq/L (ref 3.5–5.1)
SODIUM: 139 meq/L (ref 135–145)
Total Bilirubin: 0.5 mg/dL (ref 0.2–1.2)
Total Protein: 7.2 g/dL (ref 6.0–8.3)

## 2017-01-20 LAB — LIPID PANEL
CHOL/HDL RATIO: 2
CHOLESTEROL: 171 mg/dL (ref 0–200)
HDL: 83.3 mg/dL (ref >39.00)
LDL CALC: 79 mg/dL (ref 0–99)
NONHDL: 88.08
Triglycerides: 43 mg/dL (ref 0.0–149.0)
VLDL: 8.6 mg/dL (ref 0.0–40.0)

## 2017-01-20 LAB — CBC WITH DIFFERENTIAL/PLATELET
Basophils Absolute: 0.1 10*3/uL (ref 0.0–0.1)
Basophils Relative: 1 % (ref 0.0–3.0)
EOS ABS: 0.1 10*3/uL (ref 0.0–0.7)
Eosinophils Relative: 1.4 % (ref 0.0–5.0)
HCT: 42.7 % (ref 36.0–46.0)
HEMOGLOBIN: 14.5 g/dL (ref 12.0–15.0)
LYMPHS ABS: 2.1 10*3/uL (ref 0.7–4.0)
Lymphocytes Relative: 36.2 % (ref 12.0–46.0)
MCHC: 34 g/dL (ref 30.0–36.0)
MCV: 91.6 fl (ref 78.0–100.0)
MONO ABS: 0.4 10*3/uL (ref 0.1–1.0)
Monocytes Relative: 6.9 % (ref 3.0–12.0)
NEUTROS PCT: 54.5 % (ref 43.0–77.0)
Neutro Abs: 3.2 10*3/uL (ref 1.4–7.7)
Platelets: 350 10*3/uL (ref 150.0–400.0)
RBC: 4.66 Mil/uL (ref 3.87–5.11)
RDW: 13 % (ref 11.5–15.5)
WBC: 5.9 10*3/uL (ref 4.0–10.5)

## 2017-01-20 LAB — HIV ANTIBODY (ROUTINE TESTING W REFLEX): HIV: NONREACTIVE

## 2017-01-20 LAB — TSH: TSH: 2.56 u[IU]/mL (ref 0.35–4.50)

## 2017-01-22 LAB — CYTOLOGY - PAP
DIAGNOSIS: NEGATIVE
HPV: NOT DETECTED

## 2017-02-02 ENCOUNTER — Other Ambulatory Visit (INDEPENDENT_AMBULATORY_CARE_PROVIDER_SITE_OTHER): Payer: BLUE CROSS/BLUE SHIELD

## 2017-02-02 DIAGNOSIS — Z0000 Encounter for general adult medical examination without abnormal findings: Secondary | ICD-10-CM | POA: Diagnosis not present

## 2017-02-02 LAB — FECAL OCCULT BLOOD, IMMUNOCHEMICAL: FECAL OCCULT BLD: NEGATIVE

## 2017-02-07 ENCOUNTER — Other Ambulatory Visit: Payer: Self-pay | Admitting: Internal Medicine

## 2017-02-12 ENCOUNTER — Other Ambulatory Visit: Payer: Self-pay | Admitting: Internal Medicine

## 2017-02-12 NOTE — Telephone Encounter (Signed)
I believe this is your patient.  :)

## 2017-02-22 ENCOUNTER — Telehealth: Payer: Self-pay | Admitting: Internal Medicine

## 2017-02-22 MED ORDER — DEXLANSOPRAZOLE 30 MG PO CPDR: DELAYED_RELEASE_CAPSULE | ORAL | 0 refills | Status: DC

## 2017-02-22 NOTE — Telephone Encounter (Signed)
Sent in patient's [place] for pharmacy to have on file , okay to refill to cover her until her appointment.  Patient aware.

## 2017-04-14 ENCOUNTER — Encounter: Payer: Self-pay | Admitting: Internal Medicine

## 2017-04-14 ENCOUNTER — Ambulatory Visit: Payer: BLUE CROSS/BLUE SHIELD | Admitting: Internal Medicine

## 2017-04-14 VITALS — BP 120/78 | HR 72 | Ht 66.0 in | Wt 140.4 lb

## 2017-04-14 DIAGNOSIS — K30 Functional dyspepsia: Secondary | ICD-10-CM | POA: Diagnosis not present

## 2017-04-14 DIAGNOSIS — K900 Celiac disease: Secondary | ICD-10-CM

## 2017-04-14 NOTE — Progress Notes (Signed)
[name] [name] 42 y.o. May 10, 1975 [id number]  Assessment & Plan:   Encounter Diagnoses  Name Primary?  . Functional dyspepsia Yes  . Celiac disease     She will try to come off [place] which is treatment for functional dyspepsia.  I recommended she try 15 mg lansoprazole instead she may need to taper off the Dexilant with that.  She is advised that this is not a dangerous problem and that the medications are safe based upon what we know but I do support seeing if she may come off it.  The other treatment I recommended was to consider the use of FD Gard.  Regarding her celiac disease, she is not interested in testing and I think that is fine she has had good TTG antibody levels ever since starting her diet and she knows her body and symptoms and is not having any.  Would anticipate follow-up not sooner than a year routinely.  Otherwise as needed.  I appreciate the opportunity to care for this patient. CC: Ann Held, DO   Subjective:   Chief Complaint: Dyspepsia follow-up question come off PPI  HPI The patient is here because she would like to stop taking Dexilant.  She is on 30 mg daily to control epigastric discomfort that is thought to be functional dyspepsia plus minus GERD.  She feels like overall is a lot better than it was.  She uses some Gaviscon for breakthrough symptoms but does not think that that would control things if she were off her Dexilant.  She is not concerned about side effects, cost is somewhat of an issue because it is about $147 a month for the Dexilant.  She is compliant with her celiac diet.  She feels well otherwise. Allergies  Allergen Reactions  . Influenza Virus Vacc Split Pf     REACTION: pt had Joellyn Quails--- can not have flu shot   Current Meds  Medication Sig  . Dexlansoprazole (DEXILANT) 30 MG capsule TAKE 1 CAPSULE BY MOUTH DAILY BEFORE BREAKFAST.  Marland Kitchen Ferrous Sulfate (IRON) 325 (65 FE) MG TABS Take 65 mg by mouth daily.    . Multiple Vitamin (MULTIVITAMIN) tablet Take 1 tablet by mouth daily.  . Omega-3 Fatty Acids (FP FISH OIL PO) Take by mouth 1 day or 1 dose.  . Probiotic Product (PROBIOTIC ADVANCED PO) Take by mouth daily.   Past Medical History:  Diagnosis Date  . Allergy   . Basal cell carcinoma    abdomin  . Celiac disease 2009  . Functional dyspepsia 09/05/2012  . Gastritis   . Numbness in left leg    saw dr hopper w/ this  . Thyroid disease    Past Surgical History:  Procedure Laterality Date  . CESAREAN SECTION    . COLONOSCOPY    . ESOPHAGOGASTRODUODENOSCOPY    . SKIN CANCER EXCISION     abdomin  . 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  Review of Systems As above Objective:   Physical Exam BP 120/78   Pulse 72   Ht 5' 6"  (1.676 m)   Wt 140 lb 6 oz (63.7 kg)   BMI 22.66 kg/m  No acute distress  15 minutes time spent with patient > half in counseling coordination of care

## 2017-04-14 NOTE — Patient Instructions (Addendum)
Try over the counter lansoprazole 98m and taper off your Dexilant.   Purchase over the counter FDgard to try.    I appreciate the opportunity to care for you. CSilvano Rusk MD, [facility]

## 2017-05-03 DIAGNOSIS — L814 Other melanin hyperpigmentation: Secondary | ICD-10-CM | POA: Diagnosis not present

## 2017-05-03 DIAGNOSIS — Z85828 Personal history of other malignant neoplasm of skin: Secondary | ICD-10-CM | POA: Diagnosis not present

## 2017-05-03 DIAGNOSIS — L579 Skin changes due to chronic exposure to nonionizing radiation, unspecified: Secondary | ICD-10-CM | POA: Diagnosis not present

## 2017-05-03 DIAGNOSIS — L700 Acne vulgaris: Secondary | ICD-10-CM | POA: Diagnosis not present

## 2017-06-03 DIAGNOSIS — M9902 Segmental and somatic dysfunction of thoracic region: Secondary | ICD-10-CM | POA: Diagnosis not present

## 2017-06-03 DIAGNOSIS — M9903 Segmental and somatic dysfunction of lumbar region: Secondary | ICD-10-CM | POA: Diagnosis not present

## 2017-06-03 DIAGNOSIS — M9905 Segmental and somatic dysfunction of pelvic region: Secondary | ICD-10-CM | POA: Diagnosis not present

## 2017-06-03 DIAGNOSIS — M5417 Radiculopathy, lumbosacral region: Secondary | ICD-10-CM | POA: Diagnosis not present

## 2017-06-08 DIAGNOSIS — M9902 Segmental and somatic dysfunction of thoracic region: Secondary | ICD-10-CM | POA: Diagnosis not present

## 2017-06-08 DIAGNOSIS — M9905 Segmental and somatic dysfunction of pelvic region: Secondary | ICD-10-CM | POA: Diagnosis not present

## 2017-06-08 DIAGNOSIS — M5417 Radiculopathy, lumbosacral region: Secondary | ICD-10-CM | POA: Diagnosis not present

## 2017-06-08 DIAGNOSIS — M9903 Segmental and somatic dysfunction of lumbar region: Secondary | ICD-10-CM | POA: Diagnosis not present

## 2017-06-10 DIAGNOSIS — M9903 Segmental and somatic dysfunction of lumbar region: Secondary | ICD-10-CM | POA: Diagnosis not present

## 2017-06-10 DIAGNOSIS — M9905 Segmental and somatic dysfunction of pelvic region: Secondary | ICD-10-CM | POA: Diagnosis not present

## 2017-06-10 DIAGNOSIS — M9902 Segmental and somatic dysfunction of thoracic region: Secondary | ICD-10-CM | POA: Diagnosis not present

## 2017-06-10 DIAGNOSIS — M5417 Radiculopathy, lumbosacral region: Secondary | ICD-10-CM | POA: Diagnosis not present

## 2017-06-17 DIAGNOSIS — M9905 Segmental and somatic dysfunction of pelvic region: Secondary | ICD-10-CM | POA: Diagnosis not present

## 2017-06-17 DIAGNOSIS — M9902 Segmental and somatic dysfunction of thoracic region: Secondary | ICD-10-CM | POA: Diagnosis not present

## 2017-06-17 DIAGNOSIS — M5417 Radiculopathy, lumbosacral region: Secondary | ICD-10-CM | POA: Diagnosis not present

## 2017-06-17 DIAGNOSIS — M9903 Segmental and somatic dysfunction of lumbar region: Secondary | ICD-10-CM | POA: Diagnosis not present

## 2017-06-24 DIAGNOSIS — M9903 Segmental and somatic dysfunction of lumbar region: Secondary | ICD-10-CM | POA: Diagnosis not present

## 2017-06-24 DIAGNOSIS — M5417 Radiculopathy, lumbosacral region: Secondary | ICD-10-CM | POA: Diagnosis not present

## 2017-06-24 DIAGNOSIS — M9905 Segmental and somatic dysfunction of pelvic region: Secondary | ICD-10-CM | POA: Diagnosis not present

## 2017-06-24 DIAGNOSIS — M9902 Segmental and somatic dysfunction of thoracic region: Secondary | ICD-10-CM | POA: Diagnosis not present

## 2017-07-08 DIAGNOSIS — M5417 Radiculopathy, lumbosacral region: Secondary | ICD-10-CM | POA: Diagnosis not present

## 2017-07-08 DIAGNOSIS — M9905 Segmental and somatic dysfunction of pelvic region: Secondary | ICD-10-CM | POA: Diagnosis not present

## 2017-07-08 DIAGNOSIS — M9903 Segmental and somatic dysfunction of lumbar region: Secondary | ICD-10-CM | POA: Diagnosis not present

## 2017-07-08 DIAGNOSIS — M9902 Segmental and somatic dysfunction of thoracic region: Secondary | ICD-10-CM | POA: Diagnosis not present

## 2017-10-12 DIAGNOSIS — H10413 Chronic giant papillary conjunctivitis, bilateral: Secondary | ICD-10-CM | POA: Diagnosis not present

## 2017-10-12 DIAGNOSIS — H53143 Visual discomfort, bilateral: Secondary | ICD-10-CM | POA: Diagnosis not present

## 2017-10-12 DIAGNOSIS — H04123 Dry eye syndrome of bilateral lacrimal glands: Secondary | ICD-10-CM | POA: Diagnosis not present

## 2017-11-03 DIAGNOSIS — M9902 Segmental and somatic dysfunction of thoracic region: Secondary | ICD-10-CM | POA: Diagnosis not present

## 2017-11-03 DIAGNOSIS — M9903 Segmental and somatic dysfunction of lumbar region: Secondary | ICD-10-CM | POA: Diagnosis not present

## 2017-11-03 DIAGNOSIS — M9905 Segmental and somatic dysfunction of pelvic region: Secondary | ICD-10-CM | POA: Diagnosis not present

## 2017-11-03 DIAGNOSIS — M5414 Radiculopathy, thoracic region: Secondary | ICD-10-CM | POA: Diagnosis not present

## 2017-12-01 DIAGNOSIS — M9903 Segmental and somatic dysfunction of lumbar region: Secondary | ICD-10-CM | POA: Diagnosis not present

## 2017-12-01 DIAGNOSIS — M9902 Segmental and somatic dysfunction of thoracic region: Secondary | ICD-10-CM | POA: Diagnosis not present

## 2017-12-01 DIAGNOSIS — M9905 Segmental and somatic dysfunction of pelvic region: Secondary | ICD-10-CM | POA: Diagnosis not present

## 2017-12-01 DIAGNOSIS — M5414 Radiculopathy, thoracic region: Secondary | ICD-10-CM | POA: Diagnosis not present

## 2018-01-12 DIAGNOSIS — M5414 Radiculopathy, thoracic region: Secondary | ICD-10-CM | POA: Diagnosis not present

## 2018-01-12 DIAGNOSIS — M9905 Segmental and somatic dysfunction of pelvic region: Secondary | ICD-10-CM | POA: Diagnosis not present

## 2018-01-12 DIAGNOSIS — M9903 Segmental and somatic dysfunction of lumbar region: Secondary | ICD-10-CM | POA: Diagnosis not present

## 2018-01-12 DIAGNOSIS — M9902 Segmental and somatic dysfunction of thoracic region: Secondary | ICD-10-CM | POA: Diagnosis not present

## 2018-03-14 ENCOUNTER — Encounter: Payer: Self-pay | Admitting: Family

## 2018-03-14 ENCOUNTER — Ambulatory Visit (INDEPENDENT_AMBULATORY_CARE_PROVIDER_SITE_OTHER): Payer: BLUE CROSS/BLUE SHIELD | Admitting: Family

## 2018-03-14 VITALS — BP 131/87 | HR 69 | Temp 98.7°F | Resp 16 | Ht 66.0 in | Wt 137.0 lb

## 2018-03-14 DIAGNOSIS — J029 Acute pharyngitis, unspecified: Secondary | ICD-10-CM

## 2018-03-14 DIAGNOSIS — M9902 Segmental and somatic dysfunction of thoracic region: Secondary | ICD-10-CM | POA: Diagnosis not present

## 2018-03-14 DIAGNOSIS — M5414 Radiculopathy, thoracic region: Secondary | ICD-10-CM | POA: Diagnosis not present

## 2018-03-14 DIAGNOSIS — M9903 Segmental and somatic dysfunction of lumbar region: Secondary | ICD-10-CM | POA: Diagnosis not present

## 2018-03-14 DIAGNOSIS — M9905 Segmental and somatic dysfunction of pelvic region: Secondary | ICD-10-CM | POA: Diagnosis not present

## 2018-03-14 LAB — POCT RAPID STREP A (OFFICE): Rapid Strep A Screen: NEGATIVE

## 2018-03-14 NOTE — Progress Notes (Signed)
Subjective:    Patient ID: [name] [name], female    DOB: 1976-02-13, 43 y.o.   MRN: [id number]  HPI   Patient is a 43 yr old female who presents today with chief complaint of sore throat.  Reports that she has had HA and grogginess.  Denies body aches.  Only isolated to throat. She did have + flu exposure at work.  Symptoms began yesterday.    Review of Systems    see HPI  Past Medical History:  Diagnosis Date  . Allergy   . Basal cell carcinoma    abdomin  . Celiac disease 2009  . Functional dyspepsia 09/05/2012  . Gastritis   . Guillain Barr syndrome ([place])    After influenza vaccine  . Numbness in left leg    saw dr hopper w/ this  . Thyroid disease      Social History   Socioeconomic History  . Marital status: Married    Spouse name: Not on file  . Number of children: 2  . Years of education: Not on file  . Highest education level: Not on file  Occupational History  . Occupation: Doctor, general practice  W/S    Employer: [employer]  Social Needs  . Financial resource strain: Not on file  . Food insecurity:    Worry: Not on file    Inability: Not on file  . Transportation needs:    Medical: Not on file    Non-medical: Not on file  Tobacco Use  . Smoking status: Former Smoker    Last attempt to quit: 01/19/1998    Years since quitting: 20.1  . Smokeless tobacco: Never Used  . Tobacco comment: About age 51   Substance and Sexual Activity  . Alcohol use: Yes    Comment: Occ.  . Drug use: No  . Sexual activity: Yes    Partners: Male  Lifestyle  . Physical activity:    Days per week: Not on file    Minutes per session: Not on file  . Stress: Not on file  Relationships  . Social connections:    Talks on phone: Not on file    Gets together: Not on file    Attends religious service: Not on file    Active member of club or organization: Not on file    Attends meetings of clubs or organizations: Not on file    Relationship status: Not on file  . Intimate  partner violence:    Fear of current or ex partner: Not on file    Emotionally abused: Not on file    Physically abused: Not on file    Forced sexual activity: Not on file  Other Topics Concern  . 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 CESAREAN SECTION    . COLONOSCOPY    . ESOPHAGOGASTRODUODENOSCOPY    . SKIN CANCER EXCISION     abdomin  . WISDOM TOOTH EXTRACTION      Family History  Problem Relation Age of Onset  . Leukemia Mother   . Hypertension Mother   . Stroke Mother   . Cancer Mother 68       leukemia--cml  . Prostate cancer Father 70  .  Depression Father   . Hypertension Sister   . Bipolar disorder Sister   . Stroke Maternal Grandmother   . Hypertension Unknown        siblings  . Colon cancer Neg Hx   . Esophageal cancer Neg Hx   . Stomach cancer Neg Hx   . Rectal cancer Neg Hx     Allergies  Allergen Reactions  . Influenza Virus Vacc Split Pf     REACTION: pt had Joellyn Quails--- can not have flu shot    Current Outpatient Medications on File Prior to Visit  Medication Sig Dispense Refill  . Dexlansoprazole (DEXILANT) 30 MG capsule TAKE 1 CAPSULE BY MOUTH DAILY BEFORE BREAKFAST. 30 capsule 0  . Ferrous Sulfate (IRON) 325 (65 FE) MG TABS Take 65 mg by mouth daily.    . Multiple Vitamin (MULTIVITAMIN) tablet Take 1 tablet by mouth daily.    . Omega-3 Fatty Acids (FP FISH OIL PO) Take by mouth 1 day or 1 dose.    . Probiotic Product (PROBIOTIC ADVANCED PO) Take by mouth daily.     No current facility-administered medications on file prior to visit.     BP 131/87 (BP Location: Right Arm, Patient Position: Sitting, Cuff Size: Small)   Pulse 69   Temp 98.7 F (37.1 C) (Oral)   Resp 16   Ht 5' 6"  (1.676 m)   Wt 137 lb  (62.1 kg)   SpO2 100%   BMI 22.11 kg/m     Objective:   Physical Exam Constitutional:      Appearance: She is well-developed.  HENT:     Right Ear: Tympanic membrane and ear canal normal.     Left Ear: Tympanic membrane and ear canal normal.     Mouth/Throat:     Tonsils: No tonsillar exudate or tonsillar abscesses. Swelling: 1+ on the right. 1+ on the left.     Comments: Mild oropharyngeal erythema Neck:     Musculoskeletal: Neck supple.     Thyroid: No thyromegaly.  Cardiovascular:     Rate and Rhythm: Normal rate and regular rhythm.     Heart sounds: Normal heart sounds. No murmur.  Pulmonary:     Effort: Pulmonary effort is normal. No respiratory distress.     Breath sounds: Normal breath sounds. No wheezing.  Lymphadenopathy:     Cervical: No cervical adenopathy.  Skin:    General: Skin is warm and dry.  Neurological:     Mental Status: She is alert and oriented to person, place, and time.  Psychiatric:        Behavior: Behavior normal.        Thought Content: Thought content normal.        Judgment: Judgment normal.           Assessment & Plan:  Viral pharyngitis- rapid flu and strep both negative.  Pt was advised on supportive measures including motrin/chloraseptic spray prn pain, and to call if symptoms worsen or if not improved in 3-4 days.

## 2018-03-14 NOTE — Patient Instructions (Signed)
Pharyngitis  Pharyngitis is a sore throat (pharynx). This is when there is redness, pain, and swelling in your throat. Most of the time, this condition gets better on its own. In some cases, you may need medicine. Follow these instructions at home:  Take over-the-counter and prescription medicines only as told by your doctor. ? If you were prescribed an antibiotic medicine, take it as told by your doctor. Do not stop taking the antibiotic even if you start to feel better. ? Do not give children aspirin. Aspirin has been linked to Reye syndrome.  Drink enough water and fluids to keep your pee (urine) clear or pale yellow.  Get a lot of rest.  Rinse your mouth (gargle) with a salt-water mixture 3-4 times a day or as needed. To make a salt-water mixture, completely dissolve -1 tsp of salt in 1 cup of warm water.  If your doctor approves, you may use throat lozenges or sprays to soothe your throat. Contact a doctor if:  You have large, tender lumps in your neck.  You have a rash.  You cough up green, yellow-brown, or bloody spit. Get help right away if:  You have a stiff neck.  You drool or cannot swallow liquids.  You cannot drink or take medicines without throwing up.  You have very bad pain that does not go away with medicine.  You have problems breathing, and it is not from a stuffy nose.  You have new pain and swelling in your knees, ankles, wrists, or elbows. Summary  Pharyngitis is a sore throat (pharynx). This is when there is redness, pain, and swelling in your throat.  If you were prescribed an antibiotic medicine, take it as told by your doctor. Do not stop taking the antibiotic even if you start to feel better.  Most of the time, pharyngitis gets better on its own. Sometimes, you may need medicine. This information is not intended to replace advice given to you by your health care provider. Make sure you discuss any questions you have with your health care  provider. Document Released: 07/22/2007 Document Revised: 03/10/2016 Document Reviewed: 03/10/2016 Elsevier Interactive Patient Education  2019 Elsevier Inc.  

## 2018-05-10 DIAGNOSIS — M5414 Radiculopathy, thoracic region: Secondary | ICD-10-CM | POA: Diagnosis not present

## 2018-05-10 DIAGNOSIS — M9902 Segmental and somatic dysfunction of thoracic region: Secondary | ICD-10-CM | POA: Diagnosis not present

## 2018-05-10 DIAGNOSIS — M9903 Segmental and somatic dysfunction of lumbar region: Secondary | ICD-10-CM | POA: Diagnosis not present

## 2018-05-10 DIAGNOSIS — M9905 Segmental and somatic dysfunction of pelvic region: Secondary | ICD-10-CM | POA: Diagnosis not present

## 2018-06-21 DIAGNOSIS — M5414 Radiculopathy, thoracic region: Secondary | ICD-10-CM | POA: Diagnosis not present

## 2018-06-21 DIAGNOSIS — M9902 Segmental and somatic dysfunction of thoracic region: Secondary | ICD-10-CM | POA: Diagnosis not present

## 2018-06-21 DIAGNOSIS — M9905 Segmental and somatic dysfunction of pelvic region: Secondary | ICD-10-CM | POA: Diagnosis not present

## 2018-06-21 DIAGNOSIS — M9903 Segmental and somatic dysfunction of lumbar region: Secondary | ICD-10-CM | POA: Diagnosis not present

## 2018-08-02 DIAGNOSIS — M9905 Segmental and somatic dysfunction of pelvic region: Secondary | ICD-10-CM | POA: Diagnosis not present

## 2018-08-02 DIAGNOSIS — M5414 Radiculopathy, thoracic region: Secondary | ICD-10-CM | POA: Diagnosis not present

## 2018-08-02 DIAGNOSIS — M9902 Segmental and somatic dysfunction of thoracic region: Secondary | ICD-10-CM | POA: Diagnosis not present

## 2018-08-02 DIAGNOSIS — M9903 Segmental and somatic dysfunction of lumbar region: Secondary | ICD-10-CM | POA: Diagnosis not present

## 2018-09-13 DIAGNOSIS — M5414 Radiculopathy, thoracic region: Secondary | ICD-10-CM | POA: Diagnosis not present

## 2018-09-13 DIAGNOSIS — M9903 Segmental and somatic dysfunction of lumbar region: Secondary | ICD-10-CM | POA: Diagnosis not present

## 2018-09-13 DIAGNOSIS — M9905 Segmental and somatic dysfunction of pelvic region: Secondary | ICD-10-CM | POA: Diagnosis not present

## 2018-09-13 DIAGNOSIS — M9902 Segmental and somatic dysfunction of thoracic region: Secondary | ICD-10-CM | POA: Diagnosis not present

## 2018-11-08 DIAGNOSIS — M9905 Segmental and somatic dysfunction of pelvic region: Secondary | ICD-10-CM | POA: Diagnosis not present

## 2018-11-08 DIAGNOSIS — M9902 Segmental and somatic dysfunction of thoracic region: Secondary | ICD-10-CM | POA: Diagnosis not present

## 2018-11-08 DIAGNOSIS — M5414 Radiculopathy, thoracic region: Secondary | ICD-10-CM | POA: Diagnosis not present

## 2018-11-08 DIAGNOSIS — M9903 Segmental and somatic dysfunction of lumbar region: Secondary | ICD-10-CM | POA: Diagnosis not present

## 2018-11-16 DIAGNOSIS — H53143 Visual discomfort, bilateral: Secondary | ICD-10-CM | POA: Diagnosis not present

## 2018-11-16 DIAGNOSIS — H04123 Dry eye syndrome of bilateral lacrimal glands: Secondary | ICD-10-CM | POA: Diagnosis not present

## 2018-11-16 DIAGNOSIS — H10413 Chronic giant papillary conjunctivitis, bilateral: Secondary | ICD-10-CM | POA: Diagnosis not present

## 2019-01-04 DIAGNOSIS — M9902 Segmental and somatic dysfunction of thoracic region: Secondary | ICD-10-CM | POA: Diagnosis not present

## 2019-01-04 DIAGNOSIS — M5414 Radiculopathy, thoracic region: Secondary | ICD-10-CM | POA: Diagnosis not present

## 2019-01-04 DIAGNOSIS — M9905 Segmental and somatic dysfunction of pelvic region: Secondary | ICD-10-CM | POA: Diagnosis not present

## 2019-01-04 DIAGNOSIS — M9903 Segmental and somatic dysfunction of lumbar region: Secondary | ICD-10-CM | POA: Diagnosis not present

## 2019-02-22 ENCOUNTER — Other Ambulatory Visit: Payer: Self-pay

## 2019-02-22 DIAGNOSIS — M5414 Radiculopathy, thoracic region: Secondary | ICD-10-CM | POA: Diagnosis not present

## 2019-02-22 DIAGNOSIS — M9905 Segmental and somatic dysfunction of pelvic region: Secondary | ICD-10-CM | POA: Diagnosis not present

## 2019-02-22 DIAGNOSIS — M9903 Segmental and somatic dysfunction of lumbar region: Secondary | ICD-10-CM | POA: Diagnosis not present

## 2019-02-22 DIAGNOSIS — M9902 Segmental and somatic dysfunction of thoracic region: Secondary | ICD-10-CM | POA: Diagnosis not present

## 2019-02-23 ENCOUNTER — Encounter: Payer: Self-pay | Admitting: Family Medicine

## 2019-02-23 ENCOUNTER — Other Ambulatory Visit: Payer: Self-pay

## 2019-02-23 ENCOUNTER — Other Ambulatory Visit (HOSPITAL_BASED_OUTPATIENT_CLINIC_OR_DEPARTMENT_OTHER): Payer: Self-pay | Admitting: Family Medicine

## 2019-02-23 ENCOUNTER — Ambulatory Visit (INDEPENDENT_AMBULATORY_CARE_PROVIDER_SITE_OTHER): Payer: BC Managed Care – PPO | Admitting: Family Medicine

## 2019-02-23 ENCOUNTER — Other Ambulatory Visit (HOSPITAL_COMMUNITY)
Admission: RE | Admit: 2019-02-23 | Discharge: 2019-02-23 | Disposition: A | Discharge status: Home / Self Care | Payer: BC Managed Care – PPO | Source: Ambulatory Visit | Attending: Family Medicine | Admitting: Family Medicine

## 2019-02-23 VITALS — BP 110/68 | HR 66 | Temp 97.7°F | Resp 18 | Ht 66.0 in | Wt 135.6 lb

## 2019-02-23 DIAGNOSIS — Z1231 Encounter for screening mammogram for malignant neoplasm of breast: Secondary | ICD-10-CM

## 2019-02-23 DIAGNOSIS — L700 Acne vulgaris: Secondary | ICD-10-CM

## 2019-02-23 DIAGNOSIS — Z0000 Encounter for general adult medical examination without abnormal findings: Secondary | ICD-10-CM

## 2019-02-23 DIAGNOSIS — N926 Irregular menstruation, unspecified: Secondary | ICD-10-CM

## 2019-02-23 LAB — CBC WITH DIFFERENTIAL/PLATELET
Basophils Absolute: 0.1 10*3/uL (ref 0.0–0.1)
Basophils Relative: 1.2 % (ref 0.0–3.0)
Eosinophils Absolute: 0.1 10*3/uL (ref 0.0–0.7)
Eosinophils Relative: 1 % (ref 0.0–5.0)
HCT: 42.4 % (ref 36.0–46.0)
Hemoglobin: 14.3 g/dL (ref 12.0–15.0)
Lymphocytes Relative: 24.7 % (ref 12.0–46.0)
Lymphs Abs: 1.3 10*3/uL (ref 0.7–4.0)
MCHC: 33.7 g/dL (ref 30.0–36.0)
MCV: 92.8 fl (ref 78.0–100.0)
Monocytes Absolute: 0.4 10*3/uL (ref 0.1–1.0)
Monocytes Relative: 7.4 % (ref 3.0–12.0)
Neutro Abs: 3.5 10*3/uL (ref 1.4–7.7)
Neutrophils Relative %: 65.7 % (ref 43.0–77.0)
Platelets: 319 10*3/uL (ref 150.0–400.0)
RBC: 4.57 Mil/uL (ref 3.87–5.11)
RDW: 13.1 % (ref 11.5–15.5)
WBC: 5.4 10*3/uL (ref 4.0–10.5)

## 2019-02-23 LAB — COMPREHENSIVE METABOLIC PANEL
ALT: 9 U/L (ref 0–35)
AST: 13 U/L (ref 0–37)
Albumin: 4.5 g/dL (ref 3.5–5.2)
Alkaline Phosphatase: 47 U/L (ref 39–117)
BUN: 12 mg/dL (ref 6–23)
CO2: 29 mEq/L (ref 19–32)
Calcium: 9.7 mg/dL (ref 8.4–10.5)
Chloride: 100 mEq/L (ref 96–112)
Creatinine, Ser: 0.7 mg/dL (ref 0.40–1.20)
GFR: 91.48 mL/min (ref >60.00)
Glucose, Bld: 75 mg/dL (ref 70–99)
Potassium: 4 mEq/L (ref 3.5–5.1)
Sodium: 136 mEq/L (ref 135–145)
Total Bilirubin: 0.5 mg/dL (ref 0.2–1.2)
Total Protein: 6.9 g/dL (ref 6.0–8.3)

## 2019-02-23 LAB — LIPID PANEL
Cholesterol: 183 mg/dL (ref 0–200)
HDL: 84 mg/dL (ref >39.00)
LDL Cholesterol: 79 mg/dL (ref 0–99)
NonHDL: 99.09
Total CHOL/HDL Ratio: 2
Triglycerides: 98 mg/dL (ref 0.0–149.0)
VLDL: 19.6 mg/dL (ref 0.0–40.0)

## 2019-02-23 LAB — FOLLICLE STIMULATING HORMONE: FSH: 21.9 m[IU]/mL

## 2019-02-23 LAB — TSH: TSH: 1.88 u[IU]/mL (ref 0.35–4.50)

## 2019-02-23 MED ORDER — TRETINOIN 0.05 % EX CREA: TOPICAL_CREAM | Freq: Every day | CUTANEOUS | 0 refills | Status: DC

## 2019-02-23 MED ORDER — DAPSONE 7.5 % EX GEL: 1.0000 "application " | Freq: Every morning | CUTANEOUS | Status: DC

## 2019-02-23 NOTE — Patient Instructions (Signed)

## 2019-02-23 NOTE — Progress Notes (Signed)
Subjective:     [name] [name] is a 44 y.o. female and is here for a comprehensive physical exam. The patient reports no problems.  Social History   Socioeconomic History  . Marital status: Married    Spouse name: Not on file  . Number of children: 2  . Years of education: Not on file  . Highest education level: Not on file  Occupational History  . Occupation: Doctor, general practice  W/S    Employer: [employer]  Tobacco Use  . Smoking status: Former Smoker    Quit date: 01/19/1998    Years since quitting: 21.1  . Smokeless tobacco: Never Used  . Tobacco comment: About age 38   Substance and Sexual Activity  . Alcohol use: Yes    Comment: Occ.  . Drug use: No  . Sexual activity: Yes    Partners: Male  Other Topics Concern  . 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 Difficulty of Paying Living Expenses: Not on file  Food Insecurity:   . Worried About Charity fundraiser in the Last Year: Not on file  . Ran Out of Food in the Last Year: Not on file  Transportation Needs:   . Lack of Transportation (Medical): Not on file  . Lack of Transportation (Non-Medical): Not on file  Physical Activity:   . Days of Exercise per Week: Not on file  . Minutes of Exercise per Session: Not on file  Stress:   . Feeling of Stress : Not on file  Social Connections:   . Frequency of Communication with Friends and Family: Not on file  . Frequency of Social Gatherings with Friends and Family: Not on file  . Attends Religious Services: Not on file  . Active Member of Clubs or Organizations: Not on file  . Attends Archivist Meetings: Not on file  . Marital Status: Not on file  Intimate Partner Violence:   . Fear of Current or Ex-Partner: Not on file   . Emotionally Abused: Not on file  . Physically Abused: Not on file  . Sexually Abused: Not on file   Health Maintenance  Topic Date Due  . TETANUS/TDAP  05/23/2019  . PAP SMEAR-Modifier  01/20/2020  . HIV Screening  Completed    The following portions of the patient's history were reviewed and updated as appropriate: She  has a past medical history of Allergy, Basal cell carcinoma, Celiac disease (2009), Functional dyspepsia (09/05/2012), Gastritis, Guillain Barr syndrome ([place]), Numbness in left leg, and Thyroid disease. She does not have any pertinent problems on file. She  has a past surgical history that includes Cesarean section; Wisdom tooth extraction; Colonoscopy; Esophagogastroduodenoscopy; and Skin cancer excision. Her family history includes Bipolar disorder in her sister; Cancer (age of onset: 43) in her mother; Depression in her father; Hypertension in her mother, sister, and unknown relative; Leukemia in her mother; Prostate cancer (age of onset: 108) in her father; Stroke in her maternal grandmother and mother. She  reports that she quit smoking about 21 years ago. She has never used smokeless tobacco. She reports current alcohol use. She reports that she does not use drugs. She has a current medication list which  includes the following prescription(s): iron, multivitamin, probiotic product, dapsone, and tretinoin. Current Outpatient Medications on File Prior to Visit  Medication Sig Dispense Refill  . Ferrous Sulfate (IRON) 325 (65 FE) MG TABS Take 65 mg by mouth daily.    . Multiple Vitamin (MULTIVITAMIN) tablet Take 1 tablet by mouth daily.    . Probiotic Product (PROBIOTIC ADVANCED PO) Take by mouth daily.     No current facility-administered medications on file prior to visit.   She is allergic to influenza vac split quad..  Review of Systems Review of Systems  Constitutional: Negative for activity change, appetite change and fatigue.  HENT: Negative for hearing  loss, congestion, tinnitus and ear discharge.  dentist q40mEyes: Negative for visual disturbance (see optho q1y -- vision corrected to 20/20 with glasses).  Respiratory: Negative for cough, chest tightness and shortness of breath.   Cardiovascular: Negative for chest pain, palpitations and leg swelling.  Gastrointestinal: Negative for abdominal pain, diarrhea, constipation and abdominal distention.  Genitourinary: Negative for urgency, frequency, decreased urine volume and difficulty urinating.  Musculoskeletal: Negative for back pain, arthralgias and gait problem.  Skin: Negative for color change, pallor and rash.  Neurological: Negative for dizziness, light-headedness, numbness and headaches.  Hematological: Negative for adenopathy. Does not bruise/bleed easily.  Psychiatric/Behavioral: Negative for suicidal ideas, confusion, sleep disturbance, self-injury, dysphoric mood, decreased concentration and agitation.       Objective:    BP 110/68 (BP Location: Right Arm, Patient Position: Sitting, Cuff Size: Normal)   Pulse 66   Temp 97.7 F (36.5 C) (Temporal)   Resp 18   Ht 5' 6"  (1.676 m)   Wt 135 lb 9.6 oz (61.5 kg)   SpO2 98%   BMI 21.89 kg/m  General appearance: alert, cooperative, appears stated age and no distress Head: Normocephalic, without obvious abnormality, atraumatic Eyes: negative findings: lids and lashes normal, conjunctivae and sclerae normal and pupils equal, round, reactive to light and accomodation Ears: normal TM's and external ear canals both ears Nose: Nares normal. Septum midline. Mucosa normal. No drainage or sinus tenderness. Throat: lips, mucosa, and tongue normal; teeth and gums normal Neck: no adenopathy, no carotid bruit, no JVD, supple, symmetrical, trachea midline and thyroid not enlarged, symmetric, no tenderness/mass/nodules Back: symmetric, no curvature. ROM normal. No CVA tenderness. Lungs: clear to auscultation bilaterally Breasts: normal  appearance, no masses or tenderness Heart: regular rate and rhythm, S1, S2 normal, no murmur, click, rub or gallop Abdomen: soft, non-tender; bowel sounds normal; no masses,  no organomegaly Pelvic: cervix normal in appearance, external genitalia normal, no adnexal masses or tenderness, no cervical motion tenderness, rectovaginal septum normal, uterus normal size, shape, and consistency, vagina normal without discharge and pap done   rectal heme neg brown stool Extremities: extremities normal, atraumatic, no cyanosis or edema Pulses: 2+ and symmetric Skin: Skin color, texture, turgor normal. No rashes or lesions Lymph nodes: Cervical, supraclavicular, and axillary nodes normal. Neurologic: Alert and oriented X 3, normal strength and tone. Normal symmetric reflexes. Normal coordination and gait    Assessment:    Healthy female exam.      Plan:     ghm utd Check labs  See After Visit Summary for Counseling Recommendations

## 2019-02-24 LAB — ESTRADIOL: Estradiol: 113 pg/mL

## 2019-02-27 LAB — CYTOLOGY - PAP
Chlamydia: NEGATIVE
Comment: NEGATIVE
Comment: NEGATIVE
Comment: NEGATIVE
Comment: NORMAL
Diagnosis: NEGATIVE
HSV1: NEGATIVE
HSV2: NEGATIVE
Neisseria Gonorrhea: NEGATIVE
Trichomonas: NEGATIVE

## 2019-03-02 ENCOUNTER — Ambulatory Visit (HOSPITAL_BASED_OUTPATIENT_CLINIC_OR_DEPARTMENT_OTHER)
Admission: RE | Admit: 2019-03-02 | Discharge: 2019-03-02 | Disposition: A | Discharge status: Home / Self Care | Payer: BC Managed Care – PPO | Source: Ambulatory Visit | Attending: Family Medicine | Admitting: Family Medicine

## 2019-03-02 ENCOUNTER — Other Ambulatory Visit: Payer: Self-pay

## 2019-03-02 ENCOUNTER — Encounter (HOSPITAL_BASED_OUTPATIENT_CLINIC_OR_DEPARTMENT_OTHER): Payer: Self-pay

## 2019-03-02 DIAGNOSIS — N926 Irregular menstruation, unspecified: Secondary | ICD-10-CM | POA: Diagnosis not present

## 2019-03-02 DIAGNOSIS — N83202 Unspecified ovarian cyst, left side: Secondary | ICD-10-CM | POA: Diagnosis not present

## 2019-03-02 DIAGNOSIS — Z1231 Encounter for screening mammogram for malignant neoplasm of breast: Secondary | ICD-10-CM | POA: Diagnosis not present

## 2019-03-05 ENCOUNTER — Other Ambulatory Visit: Payer: Self-pay | Admitting: Family Medicine

## 2019-03-05 DIAGNOSIS — N939 Abnormal uterine and vaginal bleeding, unspecified: Secondary | ICD-10-CM

## 2019-03-06 ENCOUNTER — Encounter: Payer: Self-pay | Admitting: Family Medicine

## 2019-03-06 DIAGNOSIS — N939 Abnormal uterine and vaginal bleeding, unspecified: Secondary | ICD-10-CM

## 2019-03-15 ENCOUNTER — Encounter: Payer: Self-pay | Admitting: Obstetrics & Gynecology

## 2019-03-15 ENCOUNTER — Ambulatory Visit (INDEPENDENT_AMBULATORY_CARE_PROVIDER_SITE_OTHER): Payer: BC Managed Care – PPO | Admitting: Obstetrics & Gynecology

## 2019-03-15 ENCOUNTER — Other Ambulatory Visit: Payer: Self-pay

## 2019-03-15 VITALS — BP 143/90 | HR 66 | Ht 66.0 in | Wt 133.1 lb

## 2019-03-15 DIAGNOSIS — N939 Abnormal uterine and vaginal bleeding, unspecified: Secondary | ICD-10-CM | POA: Diagnosis not present

## 2019-03-15 DIAGNOSIS — N951 Menopausal and female climacteric states: Secondary | ICD-10-CM | POA: Diagnosis not present

## 2019-03-15 MED ORDER — NORETHIN ACE-ETH ESTRAD-FE 1-20 MG-MCG(24) PO TABS: 1.0000 | ORAL_TABLET | Freq: Every day | ORAL | 11 refills | Status: DC

## 2019-03-15 NOTE — Progress Notes (Signed)
Subjective:     [name] [name] is a 44 y.o. female here eval of AUB. Current complaints: Pt reports that she has had ~18 months of irreg bleeding.  She reports that the bleeding has been as seldom as 2 weeks and as long as 95 days. She reports that her bleeding is usually just 5-6 days and here has been no prolonged bleeding. She denies any new pain with her menses. She does reports some hot flushes that are new and occur mainly at night. She does not consider these incapacitating. There are no mood changes noted.         Gynecologic History Patient's last menstrual period was 02/24/2019. Contraception: vasectomy in spouse Last Pap: 02/23/2019 PAP and cx WNL Last mammogram: 03/02/2019. Results were: normal  Obstetric History OB History  Gravida Para Term Preterm AB Living  2 2 2     2   SAB TAB Ectopic Multiple Live Births          2    # Outcome Date GA Lbr Len/2nd Weight Sex Delivery Anes PTL Lv  2 Term 2008 [redacted]w[redacted]d  F Vag-Spont EPI N LIV  1 Term 2006 [redacted]w[redacted]d F CS-LTranv EPI N LIV     The following portions of the patient's history were reviewed and updated as appropriate: allergies, current medications, past family history, past medical history, past social history, past surgical history and problem list.  Review of Systems Pertinent items are noted in HPI.    Objective:  BP (!) 143/90   Pulse 66   Ht 5' 6"  (1.676 m)   Wt 133 lb 1.9 oz (60.4 kg)   LMP 02/24/2019   BMI 21.49 kg/m   CONSTITUTIONAL: Well-developed, well-nourished female in no acute distress.  HENT:  Normocephalic, atraumatic EYES: Conjunctivae and EOM are normal. No scleral icterus.  NECK: Normal range of motion SKIN: Skin is warm and dry. No rash noted. Not diaphoretic.No pallor. NEMercerAlert and oriented to person, place, and time. Normal coordination.  Abd: soft, NT, ND GU: EGBUS: no lesions Vagina: no blood in vault Cervix: no lesion; no mucopurulent d/c; no polyp or other abnormality.  Uterus:  small, mobile Adnexa: no masses; non tender   03/02/2019 CLINICAL DATA:  Initial evaluation for irregular vaginal bleeding.  EXAM: TRANSABDOMINAL AND TRANSVAGINAL ULTRASOUND OF PELVIS  TECHNIQUE: Both transabdominal and transvaginal ultrasound examinations of the pelvis were performed. Transabdominal technique was performed for global imaging of the pelvis including uterus, ovaries, adnexal regions, and pelvic cul-de-sac. It was necessary to proceed with endovaginal exam following the transabdominal exam to visualize the uterus, endometrium, and ovaries.  COMPARISON:  None  FINDINGS: Uterus  Measurements: 5.8 x 4.0 x 0.8 cm = volume: 58 mL. No fibroids or other mass visualized.  Endometrium  Thickness: 5.0 mm.  No focal abnormality visualized.  Right ovary  Measurements: 2.5 x 1.3 x 1.7 cm = volume: 2.7 mL. Normal appearance/no adnexal mass.  Left ovary  Measurements: 5.3 x 3.7 x 4.3 cm = volume: 44 mL. 3.9 x 2.7 x 3.6 cm simple unilocular anechoic cyst. No internal complexity, vascularity, or solid component.  Other findings  Small volume free fluid within the pelvis, likely physiologic.  IMPRESSION: 1. Endometrial stripe measures 5 mm in thickness. If bleeding remains unresponsive to hormonal or medical therapy, sonohysterogram should be considered for focal lesion work-up. (Ref: Radiological Reasoning: Algorithmic Workup of Abnormal Vaginal Bleeding with Endovaginal Sonography and Sonohysterography. AJR 2008; ; 364:W80-32 2. 3.9 cm simple left ovarian  cyst, most compatible with a normal physiologic follicular cyst. This has benign characteristics and is a common finding in premenopausal females. No imaging follow up is required. This follows consensus guidelines: Simple Adnexal Cysts: SRU Consensus Conference Update on Follow-up and Reporting. Radiology 2019; [id number]. 3. Otherwise unremarkable and normal pelvic ultrasound.   AUB- I  suspect perimenopausal bleeding. Pt has a normal [place] and her TSH and FSH are WNL.   Diagnoses and all orders for this visit:  Abnormal uterine bleeding (AUB) -     Norethindrone Acetate-Ethinyl Estrad-FE (LOESTRIN 24 FE) 1-20 MG-MCG(24) tablet; Take 1 tablet by mouth daily.  Perimenopause -     Norethindrone Acetate-Ethinyl Estrad-FE (LOESTRIN 24 FE) 1-20 MG-MCG(24) tablet; Take 1 tablet by mouth daily.  Total face-to-face time with patient was 25 min.  Greater than 50% was spent in counseling and coordination of care with the patient.   Tiyona Desouza L. Harraway-Smith, M.D., [facility]

## 2019-03-15 NOTE — Patient Instructions (Signed)

## 2019-03-16 ENCOUNTER — Encounter: Payer: Self-pay | Admitting: Obstetrics & Gynecology

## 2019-04-05 DIAGNOSIS — M5414 Radiculopathy, thoracic region: Secondary | ICD-10-CM | POA: Diagnosis not present

## 2019-04-05 DIAGNOSIS — M9902 Segmental and somatic dysfunction of thoracic region: Secondary | ICD-10-CM | POA: Diagnosis not present

## 2019-04-05 DIAGNOSIS — M9903 Segmental and somatic dysfunction of lumbar region: Secondary | ICD-10-CM | POA: Diagnosis not present

## 2019-04-05 DIAGNOSIS — M9905 Segmental and somatic dysfunction of pelvic region: Secondary | ICD-10-CM | POA: Diagnosis not present

## 2019-05-17 ENCOUNTER — Encounter: Payer: Self-pay | Admitting: Family Medicine

## 2019-05-31 ENCOUNTER — Encounter: Payer: Self-pay | Admitting: Family Medicine

## 2019-06-07 DIAGNOSIS — M9903 Segmental and somatic dysfunction of lumbar region: Secondary | ICD-10-CM | POA: Diagnosis not present

## 2019-06-07 DIAGNOSIS — M5414 Radiculopathy, thoracic region: Secondary | ICD-10-CM | POA: Diagnosis not present

## 2019-06-07 DIAGNOSIS — M9902 Segmental and somatic dysfunction of thoracic region: Secondary | ICD-10-CM | POA: Diagnosis not present

## 2019-06-07 DIAGNOSIS — M9905 Segmental and somatic dysfunction of pelvic region: Secondary | ICD-10-CM | POA: Diagnosis not present

## 2019-07-26 DIAGNOSIS — M9902 Segmental and somatic dysfunction of thoracic region: Secondary | ICD-10-CM | POA: Diagnosis not present

## 2019-07-26 DIAGNOSIS — M9905 Segmental and somatic dysfunction of pelvic region: Secondary | ICD-10-CM | POA: Diagnosis not present

## 2019-07-26 DIAGNOSIS — M9903 Segmental and somatic dysfunction of lumbar region: Secondary | ICD-10-CM | POA: Diagnosis not present

## 2019-07-26 DIAGNOSIS — M5414 Radiculopathy, thoracic region: Secondary | ICD-10-CM | POA: Diagnosis not present

## 2019-08-08 DIAGNOSIS — Z20822 Contact with and (suspected) exposure to covid-19: Secondary | ICD-10-CM | POA: Diagnosis not present

## 2019-08-24 DIAGNOSIS — M5414 Radiculopathy, thoracic region: Secondary | ICD-10-CM | POA: Diagnosis not present

## 2019-08-24 DIAGNOSIS — M9905 Segmental and somatic dysfunction of pelvic region: Secondary | ICD-10-CM | POA: Diagnosis not present

## 2019-08-24 DIAGNOSIS — M9902 Segmental and somatic dysfunction of thoracic region: Secondary | ICD-10-CM | POA: Diagnosis not present

## 2019-08-24 DIAGNOSIS — M9903 Segmental and somatic dysfunction of lumbar region: Secondary | ICD-10-CM | POA: Diagnosis not present

## 2019-08-29 DIAGNOSIS — M9905 Segmental and somatic dysfunction of pelvic region: Secondary | ICD-10-CM | POA: Diagnosis not present

## 2019-08-29 DIAGNOSIS — M9902 Segmental and somatic dysfunction of thoracic region: Secondary | ICD-10-CM | POA: Diagnosis not present

## 2019-08-29 DIAGNOSIS — M9903 Segmental and somatic dysfunction of lumbar region: Secondary | ICD-10-CM | POA: Diagnosis not present

## 2019-08-29 DIAGNOSIS — M5414 Radiculopathy, thoracic region: Secondary | ICD-10-CM | POA: Diagnosis not present

## 2019-09-08 ENCOUNTER — Telehealth: Payer: Self-pay | Admitting: Obstetrics & Gynecology

## 2019-09-08 NOTE — Telephone Encounter (Signed)
TC to pt LMTCB on home and mobile number.   [name], M.D., [facility]

## 2019-09-11 ENCOUNTER — Other Ambulatory Visit: Payer: Self-pay

## 2019-09-11 ENCOUNTER — Ambulatory Visit: Payer: BC Managed Care – PPO | Admitting: Obstetrics & Gynecology

## 2019-09-11 ENCOUNTER — Encounter: Payer: Self-pay | Admitting: Obstetrics & Gynecology

## 2019-09-11 ENCOUNTER — Telehealth (INDEPENDENT_AMBULATORY_CARE_PROVIDER_SITE_OTHER): Payer: BC Managed Care – PPO | Admitting: Obstetrics & Gynecology

## 2019-09-11 DIAGNOSIS — N939 Abnormal uterine and vaginal bleeding, unspecified: Secondary | ICD-10-CM

## 2019-09-11 DIAGNOSIS — Z3041 Encounter for surveillance of contraceptive pills: Secondary | ICD-10-CM

## 2019-09-11 MED ORDER — NORETHINDRONE ACET-ETHINYL EST 1.5-30 MG-MCG PO TABS: 1.0000 | ORAL_TABLET | Freq: Every day | ORAL | 11 refills | Status: DC

## 2019-09-11 NOTE — Progress Notes (Addendum)
GYNECOLOGY VIRTUAL VISIT ENCOUNTER NOTE  Provider location: [facility] [place] connected with [name] on 09/11/19 at 10:30 AM EDT by MyChart Video Encounter at home and verified that I am speaking with the correct person using two identifiers.   I discussed the limitations, risks, security and privacy concerns of performing an evaluation and management service virtually and the availability of in person appointments. I also discussed with the patient that there may be a patient responsible charge related to this service. The patient expressed understanding and agreed to proceed.   History:  [name] [name] is a 44 y.o. [phone] female being evaluated today for AUB. She denies any abnormal vaginal discharge, bleeding, pelvic pain or other concerns.  Pt has been on LoEstrin 1/20. She reports that the heavy cycles with clots has improved but, instead of heavy monthly cycles she is not spotting daily. She cont to have issues with sleep. She reports that this is making her a bit less functional in other areas. She also cont to have some hot flushes that are improved on the OCPs.       Past Medical History:  Diagnosis Date  . Allergy   . Basal cell carcinoma    abdomin  . Celiac disease 2009  . Functional dyspepsia 09/05/2012  . Gastritis   . Guillain Barr syndrome ([place])    After influenza vaccine  . Numbness in left leg    saw dr hopper w/ this  . Thyroid disease    Past Surgical History:  Procedure Laterality Date  . CESAREAN SECTION    . COLONOSCOPY    . ESOPHAGOGASTRODUODENOSCOPY    . SKIN CANCER EXCISION     abdomin  . WISDOM TOOTH EXTRACTION     The following portions of the patient's history were reviewed and updated as appropriate: allergies, current medications, past family history, past medical history, past social history, past surgical history and problem list.   Health Maintenance:  Normal pap on 02/23/2019. NOTE no  hrHPV done on that PAP.  Normal mammogram on 02/2019 WNL.   Review of Systems:  Pertinent items noted in HPI and remainder of comprehensive ROS otherwise negative.  Physical Exam:   General:  Alert, oriented and cooperative. Patient appears to be in no acute distress.  Mental Status: Normal mood and affect. Normal behavior. Normal judgment and thought content.   Respiratory: Normal respiratory effort, no problems with respiration noted  Rest of physical exam deferred due to type of encounter  Labs and Imaging 03/02/2019 CLINICAL DATA:  Initial evaluation for irregular vaginal bleeding.  EXAM: TRANSABDOMINAL AND TRANSVAGINAL ULTRASOUND OF PELVIS  TECHNIQUE: Both transabdominal and transvaginal ultrasound examinations of the pelvis were performed. Transabdominal technique was performed for global imaging of the pelvis including uterus, ovaries, adnexal regions, and pelvic cul-de-sac. It was necessary to proceed with endovaginal exam following the transabdominal exam to visualize the uterus, endometrium, and ovaries.  COMPARISON:  None  FINDINGS: Uterus  Measurements: 5.8 x 4.0 x 0.8 cm = volume: 58 mL. No fibroids or other mass visualized.  Endometrium  Thickness: 5.0 mm.  No focal abnormality visualized.  Right ovary  Measurements: 2.5 x 1.3 x 1.7 cm = volume: 2.7 mL. Normal appearance/no adnexal mass.  Left ovary  Measurements: 5.3 x 3.7 x 4.3 cm = volume: 44 mL. 3.9 x 2.7 x 3.6 cm simple unilocular anechoic cyst. No internal complexity, vascularity, or solid component.  Other findings  Small  volume free fluid within the pelvis, likely physiologic.  IMPRESSION: 1. Endometrial stripe measures 5 mm in thickness. If bleeding remains unresponsive to hormonal or medical therapy, sonohysterogram should be considered for focal lesion work-up. (Ref: Radiological Reasoning: Algorithmic Workup of Abnormal Vaginal Bleeding with Endovaginal Sonography and  Sonohysterography. AJR 2008; [id number]). 2. 3.9 cm simple left ovarian cyst, most compatible with a normal physiologic follicular cyst. This has benign characteristics and is a common finding in premenopausal females. No imaging follow up is required. This follows consensus guidelines: Simple Adnexal Cysts: SRU Consensus Conference Update on Follow-up and Reporting. Radiology 2019; [id number]. 3. Otherwise unremarkable and normal pelvic ultrasound.  Assessment and Plan:     1. Abnormal uterine bleeding (AUB)- I have reviewed with pt possible etiologies for her continued bleeding. There has been a pattern change which I believe is related to the low dose OCPs. I suspect she needs hight dose of the EES. I have reviewed other tx options including LnIUD. I have also shared with pt that if she cont to have bleeding an office hysteroscopy may be indicated. She would like to start with increase in the dosage of hte OCPs to see if that works.     - Norethindrone Acetate-Ethinyl Estradiol (LOESTRIN 1.5/30, 21,) 1.5-30 MG-MCG tablet; Take 1 tablet by mouth daily.  Dispense: 28 tablet; Refill: 11  F/u in 3 months or sooner prn. Pt would like to be seen virtually.    I discussed the assessment and treatment plan with the patient. The patient was provided an opportunity to ask questions and all were answered. The patient agreed with the plan and demonstrated an understanding of the instructions.   The patient was advised to call back or seek an in-person evaluation/go to the ED if the symptoms worsen or if the condition fails to improve as anticipated.  I provided 18 minutes of face-to-face time during this encounter.  Lavonia Drafts, MD [facility], [place]

## 2019-09-11 NOTE — Progress Notes (Signed)
Patient following up for birth control not working well for her. Kathrene Alu RN

## 2019-09-13 DIAGNOSIS — M9905 Segmental and somatic dysfunction of pelvic region: Secondary | ICD-10-CM | POA: Diagnosis not present

## 2019-09-13 DIAGNOSIS — M9902 Segmental and somatic dysfunction of thoracic region: Secondary | ICD-10-CM | POA: Diagnosis not present

## 2019-09-13 DIAGNOSIS — M5414 Radiculopathy, thoracic region: Secondary | ICD-10-CM | POA: Diagnosis not present

## 2019-09-13 DIAGNOSIS — M9903 Segmental and somatic dysfunction of lumbar region: Secondary | ICD-10-CM | POA: Diagnosis not present

## 2019-09-27 ENCOUNTER — Ambulatory Visit: Payer: BC Managed Care – PPO | Admitting: Obstetrics & Gynecology

## 2019-10-10 DIAGNOSIS — D1801 Hemangioma of skin and subcutaneous tissue: Secondary | ICD-10-CM | POA: Diagnosis not present

## 2019-10-10 DIAGNOSIS — D225 Melanocytic nevi of trunk: Secondary | ICD-10-CM | POA: Diagnosis not present

## 2019-10-10 DIAGNOSIS — L814 Other melanin hyperpigmentation: Secondary | ICD-10-CM | POA: Diagnosis not present

## 2019-10-10 DIAGNOSIS — L700 Acne vulgaris: Secondary | ICD-10-CM | POA: Diagnosis not present

## 2019-10-20 DIAGNOSIS — M5414 Radiculopathy, thoracic region: Secondary | ICD-10-CM | POA: Diagnosis not present

## 2019-10-20 DIAGNOSIS — M9902 Segmental and somatic dysfunction of thoracic region: Secondary | ICD-10-CM | POA: Diagnosis not present

## 2019-10-20 DIAGNOSIS — M9903 Segmental and somatic dysfunction of lumbar region: Secondary | ICD-10-CM | POA: Diagnosis not present

## 2019-10-20 DIAGNOSIS — M9905 Segmental and somatic dysfunction of pelvic region: Secondary | ICD-10-CM | POA: Diagnosis not present

## 2019-11-21 DIAGNOSIS — H53143 Visual discomfort, bilateral: Secondary | ICD-10-CM | POA: Diagnosis not present

## 2019-11-21 DIAGNOSIS — H10413 Chronic giant papillary conjunctivitis, bilateral: Secondary | ICD-10-CM | POA: Diagnosis not present

## 2019-11-21 DIAGNOSIS — H04123 Dry eye syndrome of bilateral lacrimal glands: Secondary | ICD-10-CM | POA: Diagnosis not present

## 2019-12-01 DIAGNOSIS — M9903 Segmental and somatic dysfunction of lumbar region: Secondary | ICD-10-CM | POA: Diagnosis not present

## 2019-12-01 DIAGNOSIS — M9902 Segmental and somatic dysfunction of thoracic region: Secondary | ICD-10-CM | POA: Diagnosis not present

## 2019-12-01 DIAGNOSIS — M5414 Radiculopathy, thoracic region: Secondary | ICD-10-CM | POA: Diagnosis not present

## 2019-12-01 DIAGNOSIS — M9905 Segmental and somatic dysfunction of pelvic region: Secondary | ICD-10-CM | POA: Diagnosis not present

## 2019-12-11 ENCOUNTER — Other Ambulatory Visit: Payer: Self-pay | Admitting: Obstetrics & Gynecology

## 2019-12-11 ENCOUNTER — Other Ambulatory Visit: Payer: Self-pay

## 2019-12-11 ENCOUNTER — Encounter: Payer: Self-pay | Admitting: Obstetrics & Gynecology

## 2019-12-11 ENCOUNTER — Telehealth (INDEPENDENT_AMBULATORY_CARE_PROVIDER_SITE_OTHER): Payer: BC Managed Care – PPO | Admitting: Obstetrics & Gynecology

## 2019-12-11 DIAGNOSIS — N939 Abnormal uterine and vaginal bleeding, unspecified: Secondary | ICD-10-CM

## 2019-12-11 DIAGNOSIS — Z1231 Encounter for screening mammogram for malignant neoplasm of breast: Secondary | ICD-10-CM | POA: Diagnosis not present

## 2019-12-11 DIAGNOSIS — R232 Flushing: Secondary | ICD-10-CM

## 2019-12-11 NOTE — Progress Notes (Signed)
    GYNECOLOGY VIRTUAL VISIT ENCOUNTER NOTE  Provider location: [facility] [place] connected with [name] on 12/11/19 at  8:15 AM EDT by MyChart Video Encounter at home and verified that I am speaking with the correct person using two identifiers.   I discussed the limitations, risks, security and privacy concerns of performing an evaluation and management service virtually and the availability of in person appointments. I also discussed with the patient that there may be a patient responsible charge related to this service. The patient expressed understanding and agreed to proceed.   History:  [name] [name] is a 44 y.o. [phone] female being evaluated today for AUB. Pt reports that the daily bleeding and the cramping. She also reports that the hot flashes have definitely improved in 3/4 weeks. She feels that the withdrawal week is worse but still at baseline. She denies any abnormal vaginal discharge, bleeding, pelvic pain or other concerns.  Pt has been on current regimen for 3 months.      Past Medical History:  Diagnosis Date  . Allergy   . Basal cell carcinoma    abdomin  . Celiac disease 2009  . Functional dyspepsia 09/05/2012  . Gastritis   . Guillain Barr syndrome ([place])    After influenza vaccine  . Numbness in left leg    saw dr hopper w/ this  . Thyroid disease    Past Surgical History:  Procedure Laterality Date  . CESAREAN SECTION    . COLONOSCOPY    . ESOPHAGOGASTRODUODENOSCOPY    . SKIN CANCER EXCISION     abdomin  . WISDOM TOOTH EXTRACTION     The following portions of the patient's history were reviewed and updated as appropriate: allergies, current medications, past family history, past medical history, past social history, past surgical history and problem list.    Review of Systems:  Pertinent items noted in HPI and remainder of comprehensive ROS otherwise negative.  Physical Exam:   General:  Alert,  oriented and cooperative. Patient appears to be in no acute distress.  Mental Status: Normal mood and affect. Normal behavior. Normal judgment and thought content.   Respiratory: Normal respiratory effort, no problems with respiration noted  Rest of physical exam deferred due to type of encounter   Assessment and Plan:     1. Abnormal uterine bleeding (AUB)  Improved on OCPs 2. Perimenopausal hot flushes  Loestrin 1.5/35 po 1 per day   F/u for annual in 1 year or sooner prn (by Jan 2023) Pt prefers a virtual visit when possible.  Screening mammogram Jan 2022       I discussed the assessment and treatment plan with the patient. The patient was provided an opportunity to ask questions and all were answered. The patient agreed with the plan and demonstrated an understanding of the instructions.   The patient was advised to call back or seek an in-person evaluation/go to the ED if the symptoms worsen or if the condition fails to improve as anticipated.  I provided 15 minutes of face-to-face time during this encounter.   Lavonia Drafts, MD [facility], [place]

## 2019-12-11 NOTE — Progress Notes (Signed)
Patient agrees to virtual visit. Patient following up on abnormal uterine bleeding/ birth control use. Kathrene Alu RN

## 2019-12-25 DIAGNOSIS — M9905 Segmental and somatic dysfunction of pelvic region: Secondary | ICD-10-CM | POA: Diagnosis not present

## 2019-12-25 DIAGNOSIS — M9903 Segmental and somatic dysfunction of lumbar region: Secondary | ICD-10-CM | POA: Diagnosis not present

## 2019-12-25 DIAGNOSIS — M9902 Segmental and somatic dysfunction of thoracic region: Secondary | ICD-10-CM | POA: Diagnosis not present

## 2019-12-25 DIAGNOSIS — M5414 Radiculopathy, thoracic region: Secondary | ICD-10-CM | POA: Diagnosis not present

## 2019-12-27 DIAGNOSIS — M9903 Segmental and somatic dysfunction of lumbar region: Secondary | ICD-10-CM | POA: Diagnosis not present

## 2019-12-27 DIAGNOSIS — M9905 Segmental and somatic dysfunction of pelvic region: Secondary | ICD-10-CM | POA: Diagnosis not present

## 2019-12-27 DIAGNOSIS — M9902 Segmental and somatic dysfunction of thoracic region: Secondary | ICD-10-CM | POA: Diagnosis not present

## 2019-12-27 DIAGNOSIS — M5414 Radiculopathy, thoracic region: Secondary | ICD-10-CM | POA: Diagnosis not present

## 2020-01-01 DIAGNOSIS — M9903 Segmental and somatic dysfunction of lumbar region: Secondary | ICD-10-CM | POA: Diagnosis not present

## 2020-01-01 DIAGNOSIS — M9905 Segmental and somatic dysfunction of pelvic region: Secondary | ICD-10-CM | POA: Diagnosis not present

## 2020-01-01 DIAGNOSIS — M5414 Radiculopathy, thoracic region: Secondary | ICD-10-CM | POA: Diagnosis not present

## 2020-01-01 DIAGNOSIS — M9902 Segmental and somatic dysfunction of thoracic region: Secondary | ICD-10-CM | POA: Diagnosis not present

## 2020-01-26 DIAGNOSIS — M9905 Segmental and somatic dysfunction of pelvic region: Secondary | ICD-10-CM | POA: Diagnosis not present

## 2020-01-26 DIAGNOSIS — M5414 Radiculopathy, thoracic region: Secondary | ICD-10-CM | POA: Diagnosis not present

## 2020-01-26 DIAGNOSIS — M9903 Segmental and somatic dysfunction of lumbar region: Secondary | ICD-10-CM | POA: Diagnosis not present

## 2020-01-26 DIAGNOSIS — M9902 Segmental and somatic dysfunction of thoracic region: Secondary | ICD-10-CM | POA: Diagnosis not present

## 2020-03-01 DIAGNOSIS — M9902 Segmental and somatic dysfunction of thoracic region: Secondary | ICD-10-CM | POA: Diagnosis not present

## 2020-03-01 DIAGNOSIS — M9905 Segmental and somatic dysfunction of pelvic region: Secondary | ICD-10-CM | POA: Diagnosis not present

## 2020-03-01 DIAGNOSIS — M9903 Segmental and somatic dysfunction of lumbar region: Secondary | ICD-10-CM | POA: Diagnosis not present

## 2020-03-01 DIAGNOSIS — M5414 Radiculopathy, thoracic region: Secondary | ICD-10-CM | POA: Diagnosis not present

## 2020-03-15 ENCOUNTER — Other Ambulatory Visit: Payer: Self-pay

## 2020-03-15 ENCOUNTER — Encounter: Payer: Self-pay | Admitting: Family Medicine

## 2020-03-15 ENCOUNTER — Ambulatory Visit (INDEPENDENT_AMBULATORY_CARE_PROVIDER_SITE_OTHER): Payer: BC Managed Care – PPO | Admitting: Family Medicine

## 2020-03-15 VITALS — BP 114/82 | HR 68 | Temp 98.4°F | Resp 18 | Ht 66.0 in | Wt 142.2 lb

## 2020-03-15 DIAGNOSIS — M2550 Pain in unspecified joint: Secondary | ICD-10-CM

## 2020-03-15 DIAGNOSIS — Z1159 Encounter for screening for other viral diseases: Secondary | ICD-10-CM

## 2020-03-15 DIAGNOSIS — R413 Other amnesia: Secondary | ICD-10-CM

## 2020-03-15 DIAGNOSIS — Z23 Encounter for immunization: Secondary | ICD-10-CM

## 2020-03-15 DIAGNOSIS — Z0000 Encounter for general adult medical examination without abnormal findings: Secondary | ICD-10-CM

## 2020-03-15 LAB — LIPID PANEL
Cholesterol: 177 mg/dL (ref 0–200)
HDL: 72.8 mg/dL (ref >39.00)
LDL Cholesterol: 86 mg/dL (ref 0–99)
NonHDL: 104.43
Total CHOL/HDL Ratio: 2
Triglycerides: 91 mg/dL (ref 0.0–149.0)
VLDL: 18.2 mg/dL (ref 0.0–40.0)

## 2020-03-15 LAB — CBC WITH DIFFERENTIAL/PLATELET
Basophils Absolute: 0.1 10*3/uL (ref 0.0–0.1)
Basophils Relative: 1.4 % (ref 0.0–3.0)
Eosinophils Absolute: 0 10*3/uL (ref 0.0–0.7)
Eosinophils Relative: 1 % (ref 0.0–5.0)
HCT: 44.6 % (ref 36.0–46.0)
Hemoglobin: 15.1 g/dL — ABNORMAL HIGH (ref 12.0–15.0)
Lymphocytes Relative: 35.7 % (ref 12.0–46.0)
Lymphs Abs: 1.6 10*3/uL (ref 0.7–4.0)
MCHC: 33.9 g/dL (ref 30.0–36.0)
MCV: 91.7 fl (ref 78.0–100.0)
Monocytes Absolute: 0.2 10*3/uL (ref 0.1–1.0)
Monocytes Relative: 5.2 % (ref 3.0–12.0)
Neutro Abs: 2.6 10*3/uL (ref 1.4–7.7)
Neutrophils Relative %: 56.7 % (ref 43.0–77.0)
Platelets: 352 10*3/uL (ref 150.0–400.0)
RBC: 4.86 Mil/uL (ref 3.87–5.11)
RDW: 12.8 % (ref 11.5–15.5)
WBC: 4.6 10*3/uL (ref 4.0–10.5)

## 2020-03-15 LAB — COMPREHENSIVE METABOLIC PANEL
ALT: 12 U/L (ref 0–35)
AST: 15 U/L (ref 0–37)
Albumin: 4.5 g/dL (ref 3.5–5.2)
Alkaline Phosphatase: 35 U/L — ABNORMAL LOW (ref 39–117)
BUN: 9 mg/dL (ref 6–23)
CO2: 29 mEq/L (ref 19–32)
Calcium: 9.9 mg/dL (ref 8.4–10.5)
Chloride: 102 mEq/L (ref 96–112)
Creatinine, Ser: 0.87 mg/dL (ref 0.40–1.20)
GFR: 81.43 mL/min (ref >60.00)
Glucose, Bld: 68 mg/dL — ABNORMAL LOW (ref 70–99)
Potassium: 4.2 mEq/L (ref 3.5–5.1)
Sodium: 138 mEq/L (ref 135–145)
Total Bilirubin: 0.5 mg/dL (ref 0.2–1.2)
Total Protein: 7.1 g/dL (ref 6.0–8.3)

## 2020-03-15 LAB — TSH: TSH: 1.43 u[IU]/mL (ref 0.35–4.50)

## 2020-03-15 LAB — SEDIMENTATION RATE: Sed Rate: 3 mm/hr (ref 0–20)

## 2020-03-15 LAB — VITAMIN B12: Vitamin B-12: 285 pg/mL (ref 211–911)

## 2020-03-15 NOTE — Progress Notes (Signed)
Subjective:     [name] [name] is a 45 y.o. female and is here for a comprehensive physical exam. The patient reports problems - memory issues .  It has improved since gyn started bcp but still not back to normal She would like to be tested for memory loss.   She also c/o pain in big toe ---  Chiropractor states she probably has arthritis   Social History   Socioeconomic History  . Marital status: Married    Spouse name: Not on file  . Number of children: 2  . Years of education: Not on file  . Highest education level: Not on file  Occupational History  . Occupation: Doctor, general practice  W/S    Employer: [employer]  Tobacco Use  . Smoking status: Former Smoker    Quit date: 01/19/1998    Years since quitting: 22.1  . Smokeless tobacco: Never Used  . Tobacco comment: About age 66   Vaping Use  . Vaping Use: Never used  Substance and Sexual Activity  . Alcohol use: Yes    Comment: Occ.  . Drug use: No  . Sexual activity: Yes    Partners: Male  Other Topics Concern  . 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 Hepatitis C Screening  Never done  . TETANUS/TDAP  05/23/2019  . PAP SMEAR-Modifier  02/22/2022  . COVID-19 Vaccine  Completed  . HIV Screening  Completed    The following portions of the patient's history were reviewed and updated as appropriate:  She  has a past medical history of Allergy, Basal cell carcinoma, Celiac disease (2009), Functional dyspepsia (09/05/2012), Gastritis, Guillain  Barr syndrome ([place]), Numbness in left leg, and Thyroid disease. She does not have any pertinent problems on file. She  has a past surgical history that includes Cesarean section; Wisdom tooth extraction; Colonoscopy; Esophagogastroduodenoscopy; and Skin cancer excision. Her family history includes Bipolar disorder in her sister; Cancer (age of onset: 5) in her mother; Depression in her father; Hypertension in her mother, sister, and another family member; Leukemia in her mother; Prostate cancer (age of onset: 17) in her father; Stroke in her maternal grandmother and mother. She  reports that she quit smoking about 22 years ago. She has never used smokeless tobacco. She reports current alcohol use. She reports that she does not use drugs. She has a current medication list which includes the following prescription(s): clindamycin, iron, multivitamin, norethindrone acetate-ethinyl estradiol, probiotic product, and tretinoin. Current Outpatient Medications on File Prior to Visit  Medication Sig Dispense Refill  . clindamycin (CLEOCIN T) 1 % SWAB Apply 1 each topically 2 (two) times daily.    . Ferrous Sulfate (IRON) 325 (65 FE) MG TABS Take 65 mg by mouth daily.    . Multiple Vitamin (MULTIVITAMIN) tablet Take 1 tablet by mouth daily.    . Norethindrone Acetate-Ethinyl Estradiol (LOESTRIN 1.5/30, 21,) 1.5-30 MG-MCG tablet Take 1 tablet by mouth daily.  28 tablet 11  . Probiotic Product (PROBIOTIC ADVANCED PO) Take by mouth daily.    Marland Kitchen tretinoin (RETIN-A) 0.05 % cream Apply topically at bedtime. 45 g 0   No current facility-administered medications on file prior to visit.   She is allergic to influenza vac split quad..  Review of Systems Review of Systems  Constitutional: Negative for activity change, appetite change and fatigue.  HENT: Negative for hearing loss, congestion, tinnitus and ear discharge.  dentist q63mEyes: Negative for visual disturbance (see optho q1y -- vision corrected to 20/20  with glasses).  Respiratory: Negative for cough, chest tightness and shortness of breath.   Cardiovascular: Negative for chest pain, palpitations and leg swelling.  Gastrointestinal: Negative for abdominal pain, diarrhea, constipation and abdominal distention.  Genitourinary: Negative for urgency, frequency, decreased urine volume and difficulty urinating.  Musculoskeletal: Negative for back pain,  and gait problem.  + foot pain / arthritis  Skin: Negative for color change, pallor and rash.  Neurological: Negative for dizziness, light-headedness, numbness and headaches.  Hematological: Negative for adenopathy. Does not bruise/bleed easily.  Psychiatric/Behavioral: Negative for suicidal ideas, confusion, sleep disturbance, self-injury, dysphoric mood, decreased concentration and agitation.       Objective:    BP 114/82 (BP Location: Right Arm, Patient Position: Sitting, Cuff Size: Normal)   Pulse 68   Temp 98.4 F (36.9 C) (Oral)   Resp 18   Ht 5' 6"  (1.676 m)   Wt 142 lb 3.2 oz (64.5 kg)   SpO2 99%   BMI 22.95 kg/m  General appearance: alert, cooperative, appears stated age and no distress Head: Normocephalic, without obvious abnormality, atraumatic Eyes: conjunctivae/corneas clear. PERRL, EOM's intact. Fundi benign. Ears: normal TM's and external ear canals both ears Neck: no adenopathy, no carotid bruit, no JVD, supple, symmetrical, trachea midline and thyroid not enlarged, symmetric, no tenderness/mass/nodules Back: symmetric, no curvature. ROM normal. No CVA tenderness. Lungs: clear to auscultation bilaterally Breasts: gyn Heart: regular rate and rhythm, S1, S2 normal, no murmur, click, rub or gallop Abdomen: soft, non-tender; bowel sounds normal; no masses,  no organomegaly Pelvic: deferred--gyn Extremities: extremities normal, atraumatic, no cyanosis or edema Pulses: 2+ and symmetric Skin: Skin color, texture, turgor normal. No rashes or lesions Lymph nodes: Cervical,  supraclavicular, and axillary nodes normal. Neurologic: Alert and oriented X 3, normal strength and tone. Normal symmetric reflexes. Normal coordination and gait    Assessment:    Healthy female exam.      Plan:    ghm utd Check labs  See After Visit Summary for Counseling Recommendations   1. Preventative health care See above - Lipid panel - CBC with Differential/Platelet - Comprehensive metabolic panel  2. Memory loss Check labs Refer to neuropsych - Ambulatory referral to Neuropsychology - TSH - Vitamin B12 - Sedimentation rate - Rheumatoid Factor  3. Need for tetanus booster   - Tdap vaccine greater than or equal to 7yo IM  4. Need for hepatitis C screening test   - Hepatitis C antibody  5. Pain in joint, multiple sites Check labs  prob arthritis  Tylenol arthritis or IB  Prn  - TSH - Vitamin B12 - Sedimentation rate - Rheumatoid Factor

## 2020-03-15 NOTE — Patient Instructions (Signed)
Preventive Care 44-44 Years Old, Female Preventive care refers to lifestyle choices and visits with your health care provider that can promote health and wellness. This includes:  A yearly physical exam. This is also called an annual wellness visit.  Regular dental and eye exams.  Immunizations.  Screening for certain conditions.  Healthy lifestyle choices, such as: ? Eating a healthy diet. ? Getting regular exercise. ? Not using drugs or products that contain nicotine and tobacco. ? Limiting alcohol use. What can I expect for my preventive care visit? Physical exam Your health care provider will check your:  Height and weight. These may be used to calculate your BMI (body mass index). BMI is a measurement that tells if you are at a healthy weight.  Heart rate and blood pressure.  Body temperature.  Skin for abnormal spots. Counseling Your health care provider may ask you questions about your:  Past medical problems.  Family's medical history.  Alcohol, tobacco, and drug use.  Emotional well-being.  Home life and relationship well-being.  Sexual activity.  Diet, exercise, and sleep habits.  Work and work Statistician.  Access to firearms.  Method of birth control.  Menstrual cycle.  Pregnancy history. What immunizations do I need? Vaccines are usually given at various ages, according to a schedule. Your health care provider will recommend vaccines for you based on your age, medical history, and lifestyle or other factors, such as travel or where you work.   What tests do I need? Blood tests  Lipid and cholesterol levels. These may be checked every 5 years, or more often if you are over 3 years old.  Hepatitis C test.  Hepatitis B test. Screening  Lung cancer screening. You may have this screening every year starting at age 44 if you have a 30-pack-year history of smoking and currently smoke or have quit within the past 15 years.  Colorectal cancer  screening. ? All adults should have this screening starting at age 44 and continuing until age 17. ? Your health care provider may recommend screening at age 44 if you are at increased risk. ? You will have tests every 1-10 years, depending on your results and the type of screening test.  Diabetes screening. ? This is done by checking your blood sugar (glucose) after you have not eaten for a while (fasting). ? You may have this done every 1-3 years.  Mammogram. ? This may be done every 1-2 years. ? Talk with your health care provider about when you should start having regular mammograms. This may depend on whether you have a family history of breast cancer.  BRCA-related cancer screening. This may be done if you have a family history of breast, ovarian, tubal, or peritoneal cancers.  Pelvic exam and Pap test. ? This may be done every 3 years starting at age 44. ? Starting at age 44, this may be done every 5 years if you have a Pap test in combination with an HPV test. Other tests  STD (sexually transmitted disease) testing, if you are at risk.  Bone density scan. This is done to screen for osteoporosis. You may have this scan if you are at high risk for osteoporosis. Talk with your health care provider about your test results, treatment options, and if necessary, the need for more tests. Follow these instructions at home: Eating and drinking  Eat a diet that includes fresh fruits and vegetables, whole grains, lean protein, and low-fat dairy products.  Take vitamin and mineral supplements  as recommended by your health care provider.  Do not drink alcohol if: ? Your health care provider tells you not to drink. ? You are pregnant, may be pregnant, or are planning to become pregnant.  If you drink alcohol: ? Limit how much you have to 0-1 drink a day. ? Be aware of how much alcohol is in your drink. In the [place] one drink equals one 12 oz bottle of beer (355 mL), one 5 oz glass of  wine (148 mL), or one 1 oz glass of hard liquor (44 mL).   Lifestyle  Take daily care of your teeth and gums. Brush your teeth every morning and night with fluoride toothpaste. Floss one time each day.  Stay active. Exercise for at least 30 minutes 5 or more days each week.  Do not use any products that contain nicotine or tobacco, such as cigarettes, e-cigarettes, and chewing tobacco. If you need help quitting, ask your health care provider.  Do not use drugs.  If you are sexually active, practice safe sex. Use a condom or other form of protection to prevent STIs (sexually transmitted infections).  If you do not wish to become pregnant, use a form of birth control. If you plan to become pregnant, see your health care provider for a prepregnancy visit.  If told by your health care provider, take low-dose aspirin daily starting at age 44.  Find healthy ways to cope with stress, such as: ? Meditation, yoga, or listening to music. ? Journaling. ? Talking to a trusted person. ? Spending time with friends and family. Safety  Always wear your seat belt while driving or riding in a vehicle.  Do not drive: ? If you have been drinking alcohol. Do not ride with someone who has been drinking. ? When you are tired or distracted. ? While texting.  Wear a helmet and other protective equipment during sports activities.  If you have firearms in your house, make sure you follow all gun safety procedures. What's next?  Visit your health care provider once a year for an annual wellness visit.  Ask your health care provider how often you should have your eyes and teeth checked.  Stay up to date on all vaccines. This information is not intended to replace advice given to you by your health care provider. Make sure you discuss any questions you have with your health care provider. Document Revised: 11/07/2019 Document Reviewed: 10/14/2017 Elsevier Patient Education  2021 Elsevier Inc.  

## 2020-03-18 ENCOUNTER — Ambulatory Visit (HOSPITAL_BASED_OUTPATIENT_CLINIC_OR_DEPARTMENT_OTHER)
Admission: RE | Admit: 2020-03-18 | Discharge: 2020-03-18 | Disposition: A | Discharge status: Home / Self Care | Payer: BC Managed Care – PPO | Source: Ambulatory Visit | Attending: Obstetrics & Gynecology | Admitting: Obstetrics & Gynecology

## 2020-03-18 ENCOUNTER — Other Ambulatory Visit: Payer: Self-pay | Admitting: Obstetrics & Gynecology

## 2020-03-18 ENCOUNTER — Other Ambulatory Visit: Payer: Self-pay

## 2020-03-18 DIAGNOSIS — Z1231 Encounter for screening mammogram for malignant neoplasm of breast: Secondary | ICD-10-CM

## 2020-03-18 LAB — HEPATITIS C ANTIBODY
Hepatitis C Ab: NONREACTIVE
SIGNAL TO CUT-OFF: 0 (ref <1.00)

## 2020-03-18 LAB — RHEUMATOID FACTOR: Rheumatoid fact SerPl-aCnc: <14 IU/mL (ref <14)

## 2020-03-20 ENCOUNTER — Encounter: Payer: Self-pay | Admitting: Psychology

## 2020-03-29 DIAGNOSIS — M9902 Segmental and somatic dysfunction of thoracic region: Secondary | ICD-10-CM | POA: Diagnosis not present

## 2020-03-29 DIAGNOSIS — M9903 Segmental and somatic dysfunction of lumbar region: Secondary | ICD-10-CM | POA: Diagnosis not present

## 2020-03-29 DIAGNOSIS — M9905 Segmental and somatic dysfunction of pelvic region: Secondary | ICD-10-CM | POA: Diagnosis not present

## 2020-03-29 DIAGNOSIS — M5414 Radiculopathy, thoracic region: Secondary | ICD-10-CM | POA: Diagnosis not present

## 2020-04-26 DIAGNOSIS — M5414 Radiculopathy, thoracic region: Secondary | ICD-10-CM | POA: Diagnosis not present

## 2020-04-26 DIAGNOSIS — M9905 Segmental and somatic dysfunction of pelvic region: Secondary | ICD-10-CM | POA: Diagnosis not present

## 2020-04-26 DIAGNOSIS — M9903 Segmental and somatic dysfunction of lumbar region: Secondary | ICD-10-CM | POA: Diagnosis not present

## 2020-04-26 DIAGNOSIS — M9902 Segmental and somatic dysfunction of thoracic region: Secondary | ICD-10-CM | POA: Diagnosis not present

## 2020-05-02 ENCOUNTER — Ambulatory Visit (INDEPENDENT_AMBULATORY_CARE_PROVIDER_SITE_OTHER): Payer: BC Managed Care – PPO | Admitting: Psychology

## 2020-05-02 ENCOUNTER — Ambulatory Visit: Payer: BC Managed Care – PPO | Admitting: Psychology

## 2020-05-02 ENCOUNTER — Encounter: Payer: Self-pay | Admitting: Psychology

## 2020-05-02 ENCOUNTER — Other Ambulatory Visit: Payer: Self-pay

## 2020-05-02 DIAGNOSIS — R4189 Other symptoms and signs involving cognitive functions and awareness: Secondary | ICD-10-CM

## 2020-05-02 DIAGNOSIS — G610 Guillain-Barre syndrome: Secondary | ICD-10-CM | POA: Diagnosis not present

## 2020-05-02 DIAGNOSIS — G4720 Circadian rhythm sleep disorder, unspecified type: Secondary | ICD-10-CM

## 2020-05-02 NOTE — Progress Notes (Signed)
   Psychometrician Note   Cognitive testing was administered to [name] [name] by Milana Kidney, B.S. (psychometrist) under the supervision of Dr. Christia Reading, Ph.D., licensed psychologist on 05/02/20. Ms. Baxley did not appear overtly distressed by the testing session per behavioral observation or responses across self-report questionnaires. Rest breaks were offered.    The battery of tests administered was selected by Dr. Christia Reading, Ph.D. with consideration to Ms. Reller's current level of functioning, the nature of her symptoms, emotional and behavioral responses during interview, level of literacy, observed level of motivation/effort, and the nature of the referral question. This battery was communicated to the psychometrist. Communication between Dr. Christia Reading, Ph.D. and the psychometrist was ongoing throughout the evaluation and Dr. Christia Reading, Ph.D. was immediately accessible at all times. Dr. Christia Reading, Ph.D. provided supervision to the psychometrist on the date of this service to the extent necessary to assure the quality of all services provided.    RAYMIE TRANI will return within approximately 1-2 weeks for an interactive feedback session with Dr. Melvyn Novas at which time her test performances, clinical impressions, and treatment recommendations will be reviewed in detail. Ms. Raglin understands she can contact our office should she require our assistance before this time.  A total of 175 minutes of billable time were spent face-to-face with Ms. Larimer by the psychometrist. This includes both test administration and scoring time. Billing for these services is reflected in the clinical report generated by Dr. Christia Reading, Ph.D.  This note reflects time spent with the psychometrician and does not include test scores or any clinical interpretations made by Dr. Melvyn Novas. The full report will follow in a separate note.

## 2020-05-02 NOTE — Progress Notes (Signed)
NEUROPSYCHOLOGICAL EVALUATION [place]. [facility] Department of Neurology  Date of Evaluation: May 02, 2020  Reason for Referral:   [name] [name] is a 45 y.o. right-handed Caucasian female referred by Roma Schanz, D.O., to characterize her current cognitive functioning and assist with diagnostic clarity and treatment planning in the context of subjective cognitive decline.   Assessment and Plan:   Clinical Impression(s): [name] [name] pattern of performance is suggestive of neuropsychological functioning within normal limits relative to age-matched peers and premorbid intellectual estimations. Performance was appropriate across all cognitive domains including processing speed, attention/concentration, executive functioning, receptive and expressive language, visuospatial abilities, learning and memory, and fine motor coordination/speed. In fact, not a single task scored below the average normative range across testing. [name] denied difficulties completing instrumental activities of daily living (ADLs) independently. Specific to memory, [name] [name] was able to learn novel verbal and visual information efficiently and retain this knowledge after lengthy delays. Overall, memory performance combined with intact performances across other areas of cognitive functioning is not suggestive of early-onset Alzheimer's disease. Likewise, her cognitive and behavioral profile is not suggestive of any other form of neurodegenerative illness presently.  Responses across brief mood-related questionnaires were appropriate. Responses across a lengthier personality assessment did elevate one clinical subscale suggesting ongoing somatic complaints. Across this questionnaire, [name] [name] reported an unusual degree of concern surrounding physical functioning and health matters. Physical complaints were likely to include symptoms of distress across several biological systems,  including neurological, gastrointestinal, and musculoskeletal systems. Responses were not inconsistent with a somatization disorder. Very mild depressive symptomatology and anxious distress was also endorsed. Overall, the most likely culprit for experienced cognitive dysfunction is a combination of ongoing sleep disruption, mild psychiatric distress, and preoccupation with somatic concerns.   Recommendations: [name] [name] should consider discussing options to assist in her falling asleep and staying asleep throughout the night with her PCP to alleviate ongoing sleep dysfunction.   Should she feel that psychiatric symptoms are playing more than a minimal role in her current presentation, a combination of medication and psychotherapy has been shown to be most effective at treating symptoms of anxiety and depression. As such, [name] [name] could consider speaking with her prescribing physician regarding medication adjustments to optimally manage these symptoms.  Likewise, [name] [name] could also consider engaging in short-term psychotherapy to address symptoms of psychiatric distress. She would benefit from an active and collaborative therapeutic environment, rather than one purely supportive in nature. Recommended treatment modalities include Cognitive Behavioral Therapy (CBT) or Acceptance and Commitment Therapy (ACT).  [name] [name] is encouraged to attend to lifestyle factors for brain health (e.g., regular physical exercise, good nutrition habits, regular participation in cognitively-stimulating activities, and general stress management techniques), which are likely to have benefits for both emotional adjustment and cognition. In fact, in addition to promoting good general health, regular exercise incorporating aerobic activities (e.g., brisk walking, jogging, cycling, etc.) has been demonstrated to be a very effective treatment for depression and stress, with similar efficacy rates to both antidepressant  medication and psychotherapy.  If interested, there are some activities which have therapeutic value and can be useful in keeping her cognitively stimulated. For suggestions, [name] [name] is encouraged to go to the following website: https://www.[facility].org/get-to-know-barrow/centers-programs/neurorehabilitation-center/neuro-rehab-apps-and-games/ which has options, categorized by level of difficulty. It should be noted that these activities should not be viewed as a substitute for therapy.  When learning new information, she would benefit from information being broken up into small, manageable pieces.  She may also find it helpful to articulate the material in her own words and in a context to promote encoding at the onset of a new task. This material may need to be repeated multiple times to promote encoding.  Memory can be improved using internal strategies such as rehearsal, repetition, chunking, mnemonics, association, and imagery. External strategies such as written notes in a consistently used memory journal, visual and nonverbal auditory cues such as a calendar on the refrigerator or appointments with alarm, such as on a cell phone, can also help maximize recall.    To address problems with fluctuating attention, she may wish to consider:   -Avoiding external distractions when needing to concentrate   -Limiting exposure to fast paced environments with multiple sensory demands   -Writing down complicated information and using checklists   -Attempting and completing one task at a time (i.e., no multi-tasking)   -Verbalizing aloud each step of a task to maintain focus   -Reducing the amount of information considered at one time  Review of Records:   [name] [name] was seen by her PCP Roma Schanz, D.O.) on 03/15/2020 for a wellness exam. At that time, [name] [name] reported ongoing short-term memory concerns and expressed a desire to complete formalized cognitive testing. No further details  were provided. Ultimately, [name] [name] was referred for a comprehensive neuropsychological evaluation to characterize her cognitive abilities and to assist with diagnostic clarity and treatment planning.   No neuroimaging was available for review.   Past Medical History:  Diagnosis Date  . Allergy   . Basal cell carcinoma    abdomen  . Celiac disease 2009  . Functional dyspepsia 09/05/2012  . Gastritis   . GERD (gastroesophageal reflux disease) 08/08/2012  . Guillain Barr syndrome 04/01/2007   After influenza vaccine  . Hyperthyroidism 04/01/2007  . Hypoglycemia, unspecified 10/26/2007    Past Surgical History:  Procedure Laterality Date  . CESAREAN SECTION    . COLONOSCOPY    . ESOPHAGOGASTRODUODENOSCOPY    . SKIN CANCER EXCISION     abdomen  . WISDOM TOOTH EXTRACTION      Current Outpatient Medications:  .  clindamycin (CLEOCIN T) 1 % SWAB, Apply 1 each topically 2 (two) times daily., Disp: , Rfl:  .  Ferrous Sulfate (IRON) 325 (65 FE) MG TABS, Take 65 mg by mouth daily., Disp: , Rfl:  .  Multiple Vitamin (MULTIVITAMIN) tablet, Take 1 tablet by mouth daily., Disp: , Rfl:  .  Norethindrone Acetate-Ethinyl Estradiol (LOESTRIN 1.5/30, 21,) 1.5-30 MG-MCG tablet, Take 1 tablet by mouth daily., Disp: 28 tablet, Rfl: 11 .  Probiotic Product (PROBIOTIC ADVANCED PO), Take by mouth daily., Disp: , Rfl:  .  tretinoin (RETIN-A) 0.05 % cream, Apply topically at bedtime., Disp: 45 g, Rfl: 0  Clinical Interview:   The following information was obtained during a clinical interview with Ms. Benninger and her husband prior to cognitive testing.  Cognitive Symptoms: Decreased short-term memory: Endorsed. She reported "never having a great memory" but reported that symptoms have seemed worse during the past 1-1.5 years. She described primary difficulties recalling the details of prior conversations or events. Her husband added that they will sometimes have conversations that include "we already  talked about this." Ms. Oppenheimer was insightful and wondered if memory changes could be age-related or related to ongoing sleep disturbances and symptoms of menopause requiring hormone replacement therapy.  Decreased long-term memory: Denied. Decreased attention/concentration: Endorsed. She reported trouble with sustained attention and increased distractibility. She  reported that some of these deficits had been present since childhood but that she was never formally assessed for ADHD or a specific learning disability. Difficulties have more or less persisted throughout her life. She also added that she likely needs to spend less time being distracted by her phone.  Reduced processing speed: Denied. Difficulties with executive functions: Denied. Overt personality changes were also denied.  Difficulties with emotion regulation: Denied. Difficulties with receptive language: Endorsed. She alluded to some difficulties translating sounds and syllables in her mind. This was said to be somewhat longstanding in nature and has impacted reading endeavors in the past. This was not said to be related to hearing dysfunction.  Difficulties with word finding: Endorsed. However, she wondered if reported instances were normal or age-appropriate.  Decreased visuoperceptual ability: Denied.  Difficulties completing ADLs: Denied.  Additional Medical History: History of traumatic brain injury/concussion: Denied. History of stroke: Denied. History of seizure activity: Denied. History of known exposure to toxins: Denied. Symptoms of chronic pain: Denied outside of potential arthritis surrounding various joints.  Experience of frequent headaches/migraines: Denied. Frequent instances of dizziness/vertigo: Denied. Some symptoms were said to be present and attributed to menopause. These have improved since starting hormone treatment.   Sensory changes: She wears reading glasses with positive effect. Other sensory  changes/difficulties (i.e., hearing, taste, smell) were denied.  Balance/coordination difficulties: Denied. She reported a longstanding history of being mildly clumsy but did not report any functional concerns or recent falls.  Other motor difficulties: Denied.  Other medical conditions: Ms. Weyman has a history of Guillain-Barre Syndrome. Her most recent symptom occurrence/relapse was said to be in 1998-1999 which did require hospitalization. She reported 4% nerve damage stemming from this event. However, her symptoms have been reportedly stable since that time. Currently, she reported mild numbness/tingling in her fingers and toes, worse when in lower temperatures. However, these symptoms were not said to be functionally limiting.   Sleep History: Estimated hours obtained each night: 7-7.5 hours.  Difficulties falling asleep: Denied. Difficulties staying asleep: Endorsed. She reported times where she will wake around 3-4:00am for unknown reasons and then have difficulty falling back sleep. She also described instances where she will be awoken by the dog in the middle of the night and then be unable to return to sleep for another hour or two. Alcohol was said to make symptoms worse; however, they are present during the week when she does not drink as well. Sleep dysfunction was said to represent a particularly salient concern for her.  Feels rested and refreshed upon awakening: Denied.  History of snoring: Denied. History of waking up gasping for air: Denied. Witnessed breath cessation while asleep: Denied.  History of vivid dreaming: Denied. Excessive movement while asleep: Denied. Instances of acting out her dreams: Denied.  Psychiatric/Behavioral Health History: Depression: She described her mood as "always a little bit grumpy." This was noted with a fair bit of humor, noting that her family has purchased her a Grumpy mug from [employer] in the past. Her and her husband denied any changes in  her mood relative to her baseline. She also denied being previously diagnosed with a mental health concern such as depression or anxiety in the past. Current or remote suicidal ideation, intent, or plan was denied.  Anxiety: Denied. Mania: Denied. Trauma History: Denied. Visual/auditory hallucinations: Denied. Delusional thoughts: Denied.  Tobacco: Denied. Alcohol: She reported consuming a couple glasses of wine during the weekends, perhaps more if she is on vacation. She denied a history  of problematic alcohol abuse or dependence.  Recreational drugs: Denied. Caffeine: One cup of coffee and tea in the morning.   Family History: Problem Relation Age of Onset  . Leukemia Mother 55  . Hypertension Mother   . Stroke Mother   . Memory loss Mother   . Prostate cancer Father 22  . Depression Father   . Hypertension Sister   . Bipolar disorder Sister   . Stroke Maternal Grandmother   . Hypertension Other        siblings  . Autoimmune disease Other        numerous family members on her mother's side  . Multiple sclerosis Cousin   . Lupus Cousin   . Colon cancer Neg Hx   . Esophageal cancer Neg Hx   . Stomach cancer Neg Hx   . Rectal cancer Neg Hx    This information was confirmed by Ms. Shenberger.  Academic/Vocational History: Highest level of educational attainment: 16 years. She graduated from high school and earned a Dietitian. She reported struggling academically in earlier education settings, noting a longstanding weakness in reading/reading comprehension. She described that a slow reading rate has persisted to present day. She denied ever being evaluated for a specific learning disability in the past. 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  History of developmental delay: Denied. History of grade repetition: Denied. Enrollment in special education courses: Denied. History of LD/ADHD: Denied.  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 She reported transitioning to a new role in this company within the past year, stating that she has had a little bit of trouble learning new job-related tasks and information.   Evaluation Results:   Behavioral Observations: Ms. Cuthrell was accompanied by her husband, arrived to her appointment on time, and was appropriately dressed and groomed. She appeared alert and oriented. Observed gait and station were within normal limits. Gross motor functioning appeared intact upon informal observation and no abnormal movements (e.g., tremors) were noted. Her affect was generally relaxed and positive, but did range appropriately given the subject being discussed during the clinical interview or the task at hand during testing procedures. Spontaneous speech was fluent and word finding difficulties were not observed during the clinical interview. Thought processes were coherent, organized, and normal in content. Insight into her cognitive difficulties appeared adequate. During testing, sustained attention was appropriate. Task engagement was adequate and she persisted when challenged. Overall, Ms. Suchecki was cooperative with the clinical interview and subsequent testing procedures.   Adequacy of Effort: The validity of neuropsychological testing is limited by the extent to which the individual being tested may be assumed to have exerted adequate effort during testing. Ms. Seufert expressed her intention to perform to the best of her abilities and exhibited adequate task engagement and persistence. Scores across stand-alone and embedded performance validity measures were within expectation. As such, the results of the current evaluation are believed to be a valid representation of Ms. Murty's current cognitive functioning.  Test Results: Ms. Catano was fully oriented at the time of the current evaluation.  Intellectual abilities based upon educational and  vocational attainment were estimated to be in the average range. Premorbid abilities were estimated to be within the above average range based upon a single-word reading test.   Processing speed was average to above average. Basic attention was average. More complex attention (e.g., working memory) was also average. Executive functioning was average to above average.  While not directly assessed, receptive language abilities were believed to be intact. Ms. Strick did not exhibit any difficulties comprehending task instructions and answered all questions asked of her appropriately. Assessed expressive language (e.g., verbal fluency and confrontation naming) was average to above average.     Assessed visuospatial/visuoconstructional abilities were above average.    Learning (i.e., encoding) of novel verbal and visual information was average to above average. Spontaneous delayed recall (i.e., retrieval) of previously learned information was average. Retention rates were 88% across a story learning task, 100% across a list learning task, and 103% across a shape learning task. Performance across recognition tasks was average to above average, suggesting evidence for information consolidation.   Results of emotional screening instruments suggested that recent symptoms of generalized anxiety were in the minimal range, while symptoms of depression were within normal limits. Across a more in-depth personality assessment, she elevated a single clinical subscale suggesting ongoing somatic complaints. A screening instrument assessing recent sleep quality suggested the presence of mild sleep dysfunction.  Tables of Scores:   Note: This summary of test scores accompanies the interpretive report and should not be considered in isolation without reference to the appropriate sections in the text. Descriptors are based on appropriate normative data and may be adjusted based on clinical judgment. The terms "impaired"  and "within normal limits (WNL)" are used when a more specific level of functioning cannot be determined.       Effort Testing:   DESCRIPTOR       ACS Word Choice: --- --- Within Expectation  Dot Counting Test: --- --- Within Expectation  NAB EVI: --- --- Within Expectation  D-KEFS Color Word Effort Index: --- --- Within Expectation       Orientation:      Raw Score Percentile   NAB Orientation, Form 1 29/29 --- ---       Cognitive Screening:           Raw Score Percentile   SLUMS: 27/30 --- ---       Intellectual Functioning:           Standard Score Percentile   Test of Premorbid Functioning: 116 86 Above Average       Memory:          NAB Memory Module, Form 1: Standard Score/ T Score Percentile   Total Memory Index 103 58 Average  List Learning       Total Trials 1-3 29/36 (52) 58 Average    List B 6/12 (50) 50 Average    Short Delay Free Recall 10/12 (54) 66 Average    Long Delay Free Recall 10/12 (54) 66 Average    Retention Percentage 100 (50) 50 Average    Recognition Discriminability 12 (60) 84 Above Average  Shape Learning       Total Trials 1-3 22/27 (58) 79 Above Average    Delayed Recall 7/9 (51) 54 Average    Retention Percentage 88 (45) 31 Average    Recognition Discriminability 8 (53) 62 Average  Story Learning       Immediate Recall 65/80 (45) 31 Average    Delayed Recall 37/40 (51) 54 Average    Retention Percentage 103 (58) 79 Above Average  Daily Living Memory       Immediate Recall 46/51 (48) 42 Average    Delayed Recall 17/17 (55) 69 Average    Retention Percentage 100 (57) 75 Above Average    Recognition Hits 10/10 (57) 75 Above Average  Attention/Executive Function:          Trail Making Test (TMT): Raw Score (T Score) Percentile     Part A 23 secs., 0 errors (50) 50 Average    Part B 55 secs.,  0 errors (50) 50 Average         Scaled Score Percentile   WAIS-IV Coding: 10 50 Average       NAB Attention Module, Form 1: T Score  Percentile     Digits Forward 55 69 Average    Digits Backwards 43 25 Average       D-KEFS Color-Word Interference Test: Raw Score (Scaled Score) Percentile     Color Naming 25 secs. (12) 75 Above Average    Word Reading 21 secs. (11) 63 Average    Inhibition 52 secs. (11) 63 Average      Total Errors 0 errors (12) 75 Above Average    Inhibition/Switching 70 secs. (9) 37 Average      Total Errors 1 errors (11) 63 Average       D-KEFS Verbal Fluency Test: Raw Score (Scaled Score) Percentile     Letter Total Correct 52 (14) 91 Above Average    Category Total Correct 45 (12) 75 Above Average    Category Switching Total Correct 15 (11) 63 Average    Category Switching Accuracy 12 (10) 50 Average      Total Set Loss Errors 1 (11) 63 Average      Total Repetition Errors 1 (11) 63 Average       D-KEFS 20 Questions Test: Scaled Score Percentile     Total Weighted Achievement Score 12 75 Above Average    Initial Abstraction Score 13 84 Above Average       Language:          Verbal Fluency Test: Raw Score (T Score) Percentile     Phonemic Fluency (FAS) 52 (54) 66 Average    Animal Fluency 23 (50) 50 Average        NAB Language Module, Form 1: T Score Percentile     Naming 31/31 (53) 62 Average       Visuospatial/Visuoconstruction:      Raw Score Percentile   Clock Drawing: 10/10 --- Within Normal Limits       NAB Spatial Module, Form 1: T Score Percentile     Figure Drawing Copy 61 86 Above Average        Scaled Score Percentile   WAIS-IV Block Design: 13 84 Above Average       Sensory-Motor:          [place] Grooved Pegboard Test: Raw Score Percentile     Dominant Hand 63 secs.,  0 drops  27 Average    Non-Dominant Hand 61 secs.,  0 drops  79 Above Average       Mood and Personality:      Raw Score Percentile   Beck Depression Inventory - II: 12 --- Within Normal Limits  PROMIS Anxiety Questionnaire: 14 --- None to Slight       Personality Assessment Inventory: T  Score Percentile     Inconsistency 58 --- Within Normal Limits    Infrequency 47 --- Within Normal Limits    Negative Impression 62 --- Within Normal Limits    Positive Impression 31 --- Within Normal Limits    Somatic Complaints 72 --- Elevated    Anxiety 66 --- Within Normal Limits    Anxiety-Related Disorders 66 --- Within Normal Limits  Depression 67 --- Within Normal Limits    Mania 50 --- Within Normal Limits    Paranoia 61 --- Within Normal Limits    Schizophrenia 65 --- Within Normal Limits    Borderline Features 53 --- Within Normal Limits    Antisocial Features 56 --- Within Normal Limits    Alcohol Problems 61 --- Within Normal Limits    Drug Problems 58 --- Within Normal Limits    Aggression 66 --- Within Normal Limits    Suicidal Ideation 54 --- Within Normal Limits    Stress 48 --- Within Normal Limits    Non Support 58 --- Within Normal Limits    Treatment Rejection 57 --- Within Normal Limits    Dominance 53 --- Within Normal Limits    Warmth 23 --- Within Normal Limits       Additional Questionnaires:      Raw Score Percentile   PROMIS Sleep Disturbance Questionnaire: 28 --- Mild   Informed Consent and Coding/Compliance:   The current evaluation represents a clinical evaluation for the purposes previously outlined by the referral source and is in no way reflective of a forensic evaluation.   Ms. Revak was provided with a verbal description of the nature and purpose of the present neuropsychological evaluation. Also reviewed were the foreseeable risks and/or discomforts and benefits of the procedure, limits of confidentiality, and mandatory reporting requirements of this provider. The patient was given the opportunity to ask questions and receive answers about the evaluation. Oral consent to participate was provided by the patient.   This evaluation was conducted by Christia Reading, Ph.D., licensed clinical neuropsychologist. Ms. Streater completed a clinical  interview with Dr. Melvyn Novas, billed as one unit [phone], and 175 minutes of cognitive testing and scoring, billed as one unit [phone] and five additional units [id number]. Psychometrist Milana Kidney, B.S., assisted Dr. Melvyn Novas with test administration and scoring procedures. As a separate and discrete service, Dr. Melvyn Novas spent a total of 110 minutes in interpretation and report writing billed as one unit [phone] and one unit [phone].

## 2020-05-10 ENCOUNTER — Encounter: Payer: BC Managed Care – PPO | Admitting: Psychology

## 2020-05-15 ENCOUNTER — Ambulatory Visit
Admission: RE | Admit: 2020-05-15 | Discharge: 2020-05-15 | Disposition: A | Discharge status: Home / Self Care | Payer: BC Managed Care – PPO | Source: Ambulatory Visit | Attending: Emergency Medicine | Admitting: Emergency Medicine

## 2020-05-15 ENCOUNTER — Ambulatory Visit (INDEPENDENT_AMBULATORY_CARE_PROVIDER_SITE_OTHER): Payer: BC Managed Care – PPO | Admitting: Psychology

## 2020-05-15 ENCOUNTER — Other Ambulatory Visit: Payer: Self-pay

## 2020-05-15 VITALS — BP 164/90 | HR 67 | Temp 98.0°F | Resp 18

## 2020-05-15 DIAGNOSIS — J069 Acute upper respiratory infection, unspecified: Secondary | ICD-10-CM

## 2020-05-15 DIAGNOSIS — R4189 Other symptoms and signs involving cognitive functions and awareness: Secondary | ICD-10-CM

## 2020-05-15 DIAGNOSIS — Z20822 Contact with and (suspected) exposure to covid-19: Secondary | ICD-10-CM | POA: Diagnosis not present

## 2020-05-15 DIAGNOSIS — R059 Cough, unspecified: Secondary | ICD-10-CM

## 2020-05-15 MED ORDER — BENZONATATE 100 MG PO CAPS: 100.0000 mg | ORAL_CAPSULE | Freq: Three times a day (TID) | ORAL | 0 refills | Status: DC | PRN

## 2020-05-15 NOTE — Discharge Instructions (Addendum)
Push fluids to ensure adequate hydration and keep secretions thin.  Tylenol and/or ibuprofen as needed for pain or fevers.  Tessalon as needed for cough.  Self isolate until covid results are back.  We will notify you by phone if it is positive. Your negative results will be sent through your MyChart.    If it is positive you need to isolate from others for a total of 5 days. If no fever for 24 hours without medications, and symptoms improving you may end isolation on day 6, but wear a mask if around any others for an additional 5 days.   If symptoms worsen or do not improve in the next week to return to be seen or to follow up with your PCP.

## 2020-05-15 NOTE — ED Triage Notes (Signed)
Pt is present with c/o of cough, sore throat, nasal congestion, and fever. Pt states that her sx started Monday. Pt states that she took Tylenol at 7am this morning for fever. Pt states that her fever was 101 last night and 100 this morning before the tylenol

## 2020-05-15 NOTE — ED Provider Notes (Signed)
[facility]    CSN: [id number] Arrival date & time: 05/15/20  1155      History   Chief Complaint Chief Complaint  Patient presents with  . Cough  . Sore Throat    HPI [name] [name] is a 45 y.o. female.   [name] [name] presents with complaints of cough, fever, sore throat which started two days ago. Initially feeling like a cold, but noted temp up to 101 starting yesterday. Cough is productive. One loose stool yesterday, which isn't always unusual for her. No known ill contacts. She had recently travelled. No shortness of breath. No wheezing. No asthma history. Negative home covid test yesterday. Has taken tylenol and dayquil which has helped with fever.     ROS per HPI, negative if not otherwise mentioned.      Past Medical History:  Diagnosis Date  . Allergy   . Basal cell carcinoma    abdomen  . Celiac disease 2009  . Functional dyspepsia 09/05/2012  . Gastritis   . GERD (gastroesophageal reflux disease) 08/08/2012  . Guillain Barr syndrome 04/01/2007   After influenza vaccine  . Hyperthyroidism 04/01/2007  . Hypoglycemia, unspecified 10/26/2007    Patient Active Problem List   Diagnosis Date Noted  . Functional dyspepsia 09/05/2012  . GERD (gastroesophageal reflux disease) 08/08/2012  . Acne 08/08/2012  . Hypoglycemia, unspecified 10/26/2007  . Celiac disease 10/26/2007  . Hyperthyroidism 04/01/2007  . Guillain Barr syndrome 04/01/2007    Past Surgical History:  Procedure Laterality Date  . CESAREAN SECTION    . COLONOSCOPY    . ESOPHAGOGASTRODUODENOSCOPY    . SKIN CANCER EXCISION     abdomin  . WISDOM TOOTH EXTRACTION      OB History    Gravida  2   Para  2   Term  2   Preterm      AB      Living  2     SAB      IAB      Ectopic      Multiple      Live Births  2            Home Medications    Prior to Admission medications   Medication Sig Start Date End Date Taking? Authorizing Provider   benzonatate (TESSALON) 100 MG capsule Take 1-2 capsules (100-200 mg total) by mouth 3 (three) times daily as needed for cough. 05/15/20  Yes Ellorie Kindall, Lanelle Bal B, NP  clindamycin (CLEOCIN T) 1 % SWAB Apply 1 each topically 2 (two) times daily. 02/12/20   [provider]  Ferrous Sulfate (IRON) 325 (65 FE) MG TABS Take 65 mg by mouth daily.    [provider]  Multiple Vitamin (MULTIVITAMIN) tablet Take 1 tablet by mouth daily.    [provider]  Norethindrone Acetate-Ethinyl Estradiol (LOESTRIN 1.5/30, 21,) 1.5-30 MG-MCG tablet Take 1 tablet by mouth daily. 09/11/19   Lavonia Drafts, MD  Probiotic Product (PROBIOTIC ADVANCED PO) Take by mouth daily.    [provider]  tretinoin (RETIN-A) 0.05 % cream Apply topically at bedtime. 02/23/19   Ann Held, DO    Family History Family History  Problem Relation Age of Onset  . Leukemia Mother 62  . Hypertension Mother   . Stroke Mother   . Memory loss Mother   . Prostate cancer Father 58  . Depression Father   . Hypertension Sister   . Bipolar disorder Sister   . Stroke Maternal  Grandmother   . Hypertension Other        siblings  . Autoimmune disease Other        numerous family members on her mother's side  . Multiple sclerosis Cousin   . Lupus Cousin   . Colon cancer Neg Hx   . Esophageal cancer Neg Hx   . Stomach cancer Neg Hx   . Rectal cancer Neg Hx     Social History Social History   Tobacco Use  . Smoking status: Former Smoker    Quit date: 01/19/1998    Years since quitting: 22.3  . Smokeless tobacco: Never Used  . Tobacco comment: About age 17   Vaping Use  . Vaping Use: Never used  Substance Use Topics  . Alcohol use: Yes    Alcohol/week: 3.0 - 4.0 standard drinks    Types: 3 - 4 Glasses of wine per week    Comment: few glasses of wine on weekends  . Drug use: No     Allergies   Influenza vac split quad   Review of Systems Review of Systems   Physical  Exam Triage Vital Signs ED Triage Vitals  Enc Vitals Group     BP 05/15/20 1221 (!) 164/90     Pulse Rate 05/15/20 1221 67     Resp 05/15/20 1221 18     Temp 05/15/20 1221 98 F (36.7 C)     Temp Source 05/15/20 1221 Oral     SpO2 05/15/20 1221 98 %     Weight --      Height --      Head Circumference --      Peak Flow --      Pain Score 05/15/20 1220 0     Pain Loc --      Pain Edu? --      Excl. in [place]? --    No data found.  Updated Vital Signs BP (!) 164/90 (BP Location: Left Arm)   Pulse 67   Temp 98 F (36.7 C) (Oral)   Resp 18   SpO2 98%    Physical Exam Constitutional:      General: She is not in acute distress.    Appearance: She is well-developed.  HENT:     Mouth/Throat:     Mouth: Mucous membranes are moist.  Cardiovascular:     Rate and Rhythm: Normal rate and regular rhythm.  Pulmonary:     Effort: Pulmonary effort is normal.     Breath sounds: Normal breath sounds.  Skin:    General: Skin is warm and dry.  Neurological:     Mental Status: She is alert and oriented to person, place, and time.      UC Treatments / Results  Labs (all labs ordered are listed, but only abnormal results are displayed) Labs Reviewed  COVID-19, FLU A+B NAA    EKG   Radiology No results found.  Procedures Procedures (including critical care time)  Medications Ordered in UC Medications - No data to display  Initial Impression / Assessment and Plan / UC Course  I have reviewed the triage vital signs and the nursing notes.  Pertinent labs & imaging results that were available during my care of the patient were reviewed by me and considered in my medical decision making (see chart for details).     Non toxic. Benign physical exam.  History and physical consistent with viral illness.  Supportive cares recommended. Covid testing pending and isolation instructions provided.  Return precautions provided. Patient verbalized understanding and agreeable to plan.    Final Clinical Impressions(s) / UC Diagnoses   Final diagnoses:  Acute upper respiratory infection  Cough     Discharge Instructions     Push fluids to ensure adequate hydration and keep secretions thin.  Tylenol and/or ibuprofen as needed for pain or fevers.  Tessalon as needed for cough.  Self isolate until covid results are back.  We will notify you by phone if it is positive. Your negative results will be sent through your MyChart.    If it is positive you need to isolate from others for a total of 5 days. If no fever for 24 hours without medications, and symptoms improving you may end isolation on day 6, but wear a mask if around any others for an additional 5 days.   If symptoms worsen or do not improve in the next week to return to be seen or to follow up with your PCP.     ED Prescriptions    Medication Sig Dispense Auth. Provider   benzonatate (TESSALON) 100 MG capsule Take 1-2 capsules (100-200 mg total) by mouth 3 (three) times daily as needed for cough. 21 capsule Zigmund Gottron, NP     PDMP not reviewed this encounter.   Zigmund Gottron, NP 05/15/20 2119

## 2020-05-15 NOTE — Progress Notes (Signed)
   Neuropsychology Feedback Session Tillie Rung. [place] Department of Neurology     Ms. Brabant called stating that she had developed symptoms concerning for COVID-19 (i.e., fever with cough) during the night and needed to reschedule her feedback appointment. She was rescheduled for 05/24/2020.

## 2020-05-16 LAB — COVID-19, FLU A+B NAA
Influenza A, NAA: NOT DETECTED
Influenza B, NAA: NOT DETECTED
SARS-CoV-2, NAA: DETECTED — AB

## 2020-05-23 DIAGNOSIS — M9903 Segmental and somatic dysfunction of lumbar region: Secondary | ICD-10-CM | POA: Diagnosis not present

## 2020-05-23 DIAGNOSIS — M9905 Segmental and somatic dysfunction of pelvic region: Secondary | ICD-10-CM | POA: Diagnosis not present

## 2020-05-23 DIAGNOSIS — M5414 Radiculopathy, thoracic region: Secondary | ICD-10-CM | POA: Diagnosis not present

## 2020-05-23 DIAGNOSIS — M9902 Segmental and somatic dysfunction of thoracic region: Secondary | ICD-10-CM | POA: Diagnosis not present

## 2020-05-24 ENCOUNTER — Ambulatory Visit (INDEPENDENT_AMBULATORY_CARE_PROVIDER_SITE_OTHER): Payer: BC Managed Care – PPO | Admitting: Psychology

## 2020-05-24 ENCOUNTER — Other Ambulatory Visit: Payer: Self-pay

## 2020-05-24 DIAGNOSIS — R4189 Other symptoms and signs involving cognitive functions and awareness: Secondary | ICD-10-CM

## 2020-05-24 NOTE — Progress Notes (Signed)
   Neuropsychology Feedback Session [name] [name]. [place] Department of Neurology  Reason for Referral:   [name] [name] a 45 y.o. right-handed Caucasian female referred by Roma Schanz, D.O.,to characterize hercurrent cognitive functioning and assist with diagnostic clarity and treatment planning in the context of subjective cognitive decline.   Feedback:   [name] completed a comprehensive neuropsychological evaluation on 05/02/2020. Please refer to that encounter for the full report and recommendations. Briefly, results suggested neuropsychological functioning within normal limits relative to age-matched peers and premorbid intellectual estimations. Performance was appropriate across all cognitive domains including processing speed, attention/concentration, executive functioning, receptive and expressive language, visuospatial abilities, learning and memory, and fine motor coordination/speed. In fact, not a single task scored below the average normative range across testing. [name] denied difficulties completing instrumental activities of daily living (ADLs) independently.  [name] was unaccompanied during the current feedback session. Content of the current session focused on the results of her neuropsychological evaluation. [name] was given the opportunity to ask questions and her questions were answered. She was encouraged to reach out should additional questions arise. A copy of her report was provided/mailed at the conclusion of the visit.      Less than 16 minutes were spent conducting the current feedback session with [name].

## 2020-05-28 ENCOUNTER — Encounter: Payer: Self-pay | Admitting: Advanced Practice Midwife

## 2020-05-28 ENCOUNTER — Ambulatory Visit (INDEPENDENT_AMBULATORY_CARE_PROVIDER_SITE_OTHER): Payer: BC Managed Care – PPO | Admitting: Advanced Practice Midwife

## 2020-05-28 ENCOUNTER — Other Ambulatory Visit: Payer: Self-pay

## 2020-05-28 ENCOUNTER — Other Ambulatory Visit (HOSPITAL_COMMUNITY)
Admission: RE | Admit: 2020-05-28 | Discharge: 2020-05-28 | Disposition: A | Discharge status: Home / Self Care | Payer: BC Managed Care – PPO | Source: Ambulatory Visit | Attending: Advanced Practice Midwife | Admitting: Advanced Practice Midwife

## 2020-05-28 VITALS — BP 133/88 | HR 67 | Wt 142.0 lb

## 2020-05-28 DIAGNOSIS — B3731 Acute candidiasis of vulva and vagina: Secondary | ICD-10-CM

## 2020-05-28 DIAGNOSIS — B373 Candidiasis of vulva and vagina: Secondary | ICD-10-CM | POA: Insufficient documentation

## 2020-05-28 MED ORDER — FLUCONAZOLE 150 MG PO TABS
150.0000 mg | ORAL_TABLET | ORAL | 1 refills | Status: DC
Start: 2020-05-28 — End: 2021-03-20

## 2020-05-28 NOTE — Patient Instructions (Signed)
Vaginal Yeast Infection, Adult  Vaginal yeast infection is a condition that causes vaginal discharge as well as soreness, swelling, and redness (inflammation) of the vagina. This is a common condition. Some women get this infection frequently. What are the causes? This condition is caused by a change in the normal balance of the yeast (candida) and bacteria that live in the vagina. This change causes an overgrowth of yeast, which causes the inflammation. What increases the risk? The condition is more likely to develop in women who:  Take antibiotic medicines.  Have diabetes.  Take birth control pills.  Are pregnant.  Douche often.  Have a weak body defense system (immune system).  Have been taking steroid medicines for a long time.  Frequently wear tight clothing. What are the signs or symptoms? Symptoms of this condition include:  White, thick, creamy vaginal discharge.  Swelling, itching, redness, and irritation of the vagina. The lips of the vagina (vulva) may be affected as well.  Pain or a burning feeling while urinating.  Pain during sex. How is this diagnosed? This condition is diagnosed based on:  Your medical history.  A physical exam.  A pelvic exam. Your health care provider will examine a sample of your vaginal discharge under a microscope. Your health care provider may send this sample for testing to confirm the diagnosis. How is this treated? This condition is treated with medicine. Medicines may be over-the-counter or prescription. You may be told to use one or more of the following:  Medicine that is taken by mouth (orally).  Medicine that is applied as a cream (topically).  Medicine that is inserted directly into the vagina (suppository). Follow these instructions at home: Lifestyle  Do not have sex until your health care provider approves. Tell your sex partner that you have a yeast infection. That person should go to his or her health care  provider and ask if they should also be treated.  Do not wear tight clothes, such as pantyhose or tight pants.  Wear breathable cotton underwear. General instructions  Take or apply over-the-counter and prescription medicines only as told by your health care provider.  Eat more yogurt. This may help to keep your yeast infection from returning.  Do not use tampons until your health care provider approves.  Try taking a sitz bath to help with discomfort. This is a warm water bath that is taken while you are sitting down. The water should only come up to your hips and should cover your buttocks. Do this 3-4 times per day or as told by your health care provider.  Do not douche.  If you have diabetes, keep your blood sugar levels under control.  Keep all follow-up visits as told by your health care provider. This is important.   Contact a health care provider if:  You have a fever.  Your symptoms go away and then return.  Your symptoms do not get better with treatment.  Your symptoms get worse.  You have new symptoms.  You develop blisters in or around your vagina.  You have blood coming from your vagina and it is not your menstrual period.  You develop pain in your abdomen. Summary  Vaginal yeast infection is a condition that causes discharge as well as soreness, swelling, and redness (inflammation) of the vagina.  This condition is treated with medicine. Medicines may be over-the-counter or prescription.  Take or apply over-the-counter and prescription medicines only as told by your health care provider.  Do not  douche. Do not have sex or use tampons until your health care provider approves.  Contact a health care provider if your symptoms do not get better with treatment or your symptoms go away and then return. This information is not intended to replace advice given to you by your health care provider. Make sure you discuss any questions you have with your health care  provider. Document Revised: 09/02/2018 Document Reviewed: 06/21/2017 Elsevier Patient Education  [place].

## 2020-05-28 NOTE — Progress Notes (Signed)
GYNECOLOGY ENCOUNTER NOTE  Subjective:   [name] [name] is a 45 y.o. [phone] female here for a routine annual gynecologic exam.  Current complaints: vaginal itching since Friday.  Used generic Monistat Sunday which made burning worse. Then developed watery discharge.  Just started new pill pack, doing well on those.  Denies abnormal vaginal bleeding, discharge, pelvic pain, problems with intercourse or other gynecologic concerns.    Gynecologic History No LMP recorded. (Menstrual status: Oral contraceptives). Contraception: OCP (estrogen/progesterone)   Obstetric History OB History  Gravida Para Term Preterm AB Living  2 2 2     2   SAB IAB Ectopic Multiple Live Births          2    # Outcome Date GA Lbr Len/2nd Weight Sex Delivery Anes PTL Lv  2 Term 2008 [redacted]w[redacted]d  F Vag-Spont EPI N LIV  1 Term 2006 373w0d F CS-LTranv EPI N LIV    Past Medical History:  Diagnosis Date  . Allergy   . Basal cell carcinoma    abdomen  . Celiac disease 2009  . Functional dyspepsia 09/05/2012  . Gastritis   . GERD (gastroesophageal reflux disease) 08/08/2012  . Guillain Barr syndrome 04/01/2007   After influenza vaccine  . Hyperthyroidism 04/01/2007  . Hypoglycemia, unspecified 10/26/2007    Past Surgical History:  Procedure Laterality Date  . CESAREAN SECTION    . COLONOSCOPY    . ESOPHAGOGASTRODUODENOSCOPY    . SKIN CANCER EXCISION     abdomin  . WISDOM TOOTH EXTRACTION      Current Outpatient Medications on File Prior to Visit  Medication Sig Dispense Refill  . clindamycin (CLEOCIN T) 1 % SWAB Apply 1 each topically 2 (two) times daily.    . Ferrous Sulfate (IRON) 325 (65 FE) MG TABS Take 65 mg by mouth daily.    . Marland Kitchenlucosamine-chondroitin 500-400 MG tablet Take 1 tablet by mouth 3 (three) times daily.    . Multiple Vitamin (MULTIVITAMIN) tablet Take 1 tablet by mouth daily.    . Norethindrone Acetate-Ethinyl Estradiol (LOESTRIN 1.5/30, 21,) 1.5-30 MG-MCG tablet Take 1 tablet by  mouth daily. 28 tablet 11  . Probiotic Product (PROBIOTIC ADVANCED PO) Take by mouth daily.    . Marland Kitchenretinoin (RETIN-A) 0.05 % cream Apply topically at bedtime. 45 g 0  . benzonatate (TESSALON) 100 MG capsule Take 1-2 capsules (100-200 mg total) by mouth 3 (three) times daily as needed for cough. (Patient not taking: Reported on 05/28/2020) 21 capsule 0   No current facility-administered medications on file prior to visit.    Allergies  Allergen Reactions  . Influenza Vac Split Quad     REACTION: pt had GuJoellyn Quails- can not have flu shot    Social History   Socioeconomic History  . Marital status: Married    Spouse name: Not on file  . Number of children: 2  . Years of education: 16  . Highest education level: Bachelor's degree (e.g., BA, AB, BS)  Occupational History  . Occupation: [employer]  Tobacco Use  . Smoking status: Former Smoker    Quit date: 01/19/1998    Years since quitting: 22.3  . Smokeless tobacco: Never Used  . Tobacco comment: About age 45 Vaping Use  . Vaping Use: Never used  Substance and Sexual Activity  . Alcohol use: Yes    Alcohol/week: 3.0 - 4.0 standard drinks    Types: 3 - 4 Glasses of  wine per week    Comment: few glasses of wine on weekends  . Drug use: No  . Sexual activity: Yes    Partners: Male  Other Topics Concern  . 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 Leukemia Mother 7  . Hypertension Mother   .  Stroke Mother   . Memory loss Mother   . Prostate cancer Father 56  . Depression Father   . Hypertension Sister   . Bipolar disorder Sister   . Stroke Maternal Grandmother   . Hypertension Other        siblings  . Autoimmune disease Other        numerous family members on her mother's side  . Multiple sclerosis Cousin   . Lupus Cousin   . Colon cancer Neg Hx   . Esophageal cancer Neg Hx   . Stomach cancer Neg Hx   . Rectal cancer Neg Hx     The following portions of the patient's history were reviewed and updated as appropriate: allergies, current medications, past family history, past medical history, past social history, past surgical history and problem list.  Review of Systems Pertinent items are noted in HPI. Pertinent items noted in HPI and remainder of comprehensive ROS otherwise negative.   Objective:  BP 133/88   Pulse 67   Wt 142 lb (64.4 kg)   BMI 22.92 kg/m  CONSTITUTIONAL: Well-developed, well-nourished female in no acute distress.  SKIN: Skin is warm and dry. No rash noted. Not diaphoretic. CARDIOVASCULAR: Normal heart rate noted RESPIRATORY:  Effort normal, no problems with respiration noted. ABDOMEN: No tenderness PELVIC: Normal appearing external genitalia except area of erethema right labia; normal appearing vaginal mucosa and cervix with scattered cottage cheese-like discharge noted. Cervix normal, no lesions.  No thin discharge noted   Assessment:  Probable vaginal yeast infection, resistant to miconazole Vulvar and vaginal itching   Plan:  Will follow up results of pap smear and manage accordingly. Discussed miconazole causes burning in some women Discussed options for treatment, but recommend Diflucan for thorough coverage for all types of yeast.  May repeat in 5 days if not better Mammogram Normal in January (03/18/20) Please refer to After Visit Summary for other counseling recommendations.

## 2020-05-29 LAB — CERVICOVAGINAL ANCILLARY ONLY
Bacterial Vaginitis (gardnerella): NEGATIVE
Candida Glabrata: NEGATIVE
Candida Vaginitis: POSITIVE — AB
Comment: NEGATIVE
Comment: NEGATIVE
Comment: NEGATIVE

## 2020-06-13 DIAGNOSIS — M9905 Segmental and somatic dysfunction of pelvic region: Secondary | ICD-10-CM | POA: Diagnosis not present

## 2020-06-13 DIAGNOSIS — M9902 Segmental and somatic dysfunction of thoracic region: Secondary | ICD-10-CM | POA: Diagnosis not present

## 2020-06-13 DIAGNOSIS — M9903 Segmental and somatic dysfunction of lumbar region: Secondary | ICD-10-CM | POA: Diagnosis not present

## 2020-06-13 DIAGNOSIS — M5414 Radiculopathy, thoracic region: Secondary | ICD-10-CM | POA: Diagnosis not present

## 2020-06-18 DIAGNOSIS — M5414 Radiculopathy, thoracic region: Secondary | ICD-10-CM | POA: Diagnosis not present

## 2020-06-18 DIAGNOSIS — M9902 Segmental and somatic dysfunction of thoracic region: Secondary | ICD-10-CM | POA: Diagnosis not present

## 2020-06-18 DIAGNOSIS — M9903 Segmental and somatic dysfunction of lumbar region: Secondary | ICD-10-CM | POA: Diagnosis not present

## 2020-06-18 DIAGNOSIS — M9905 Segmental and somatic dysfunction of pelvic region: Secondary | ICD-10-CM | POA: Diagnosis not present

## 2020-07-12 DIAGNOSIS — M9905 Segmental and somatic dysfunction of pelvic region: Secondary | ICD-10-CM | POA: Diagnosis not present

## 2020-07-12 DIAGNOSIS — M5414 Radiculopathy, thoracic region: Secondary | ICD-10-CM | POA: Diagnosis not present

## 2020-07-12 DIAGNOSIS — M9902 Segmental and somatic dysfunction of thoracic region: Secondary | ICD-10-CM | POA: Diagnosis not present

## 2020-07-12 DIAGNOSIS — M9903 Segmental and somatic dysfunction of lumbar region: Secondary | ICD-10-CM | POA: Diagnosis not present

## 2020-08-09 DIAGNOSIS — M9905 Segmental and somatic dysfunction of pelvic region: Secondary | ICD-10-CM | POA: Diagnosis not present

## 2020-08-09 DIAGNOSIS — M9903 Segmental and somatic dysfunction of lumbar region: Secondary | ICD-10-CM | POA: Diagnosis not present

## 2020-08-09 DIAGNOSIS — M5414 Radiculopathy, thoracic region: Secondary | ICD-10-CM | POA: Diagnosis not present

## 2020-08-09 DIAGNOSIS — M9902 Segmental and somatic dysfunction of thoracic region: Secondary | ICD-10-CM | POA: Diagnosis not present

## 2020-09-13 DIAGNOSIS — M5414 Radiculopathy, thoracic region: Secondary | ICD-10-CM | POA: Diagnosis not present

## 2020-09-13 DIAGNOSIS — M9905 Segmental and somatic dysfunction of pelvic region: Secondary | ICD-10-CM | POA: Diagnosis not present

## 2020-09-13 DIAGNOSIS — M9903 Segmental and somatic dysfunction of lumbar region: Secondary | ICD-10-CM | POA: Diagnosis not present

## 2020-09-13 DIAGNOSIS — M9902 Segmental and somatic dysfunction of thoracic region: Secondary | ICD-10-CM | POA: Diagnosis not present

## 2020-09-16 ENCOUNTER — Other Ambulatory Visit: Payer: Self-pay

## 2020-09-16 DIAGNOSIS — N939 Abnormal uterine and vaginal bleeding, unspecified: Secondary | ICD-10-CM

## 2020-09-16 MED ORDER — NORETHINDRONE ACET-ETHINYL EST 1.5-30 MG-MCG PO TABS: 1.0000 | ORAL_TABLET | Freq: Every day | ORAL | 3 refills | Status: DC

## 2020-10-09 DIAGNOSIS — D225 Melanocytic nevi of trunk: Secondary | ICD-10-CM | POA: Diagnosis not present

## 2020-10-09 DIAGNOSIS — L700 Acne vulgaris: Secondary | ICD-10-CM | POA: Diagnosis not present

## 2020-10-09 DIAGNOSIS — L814 Other melanin hyperpigmentation: Secondary | ICD-10-CM | POA: Diagnosis not present

## 2020-10-09 DIAGNOSIS — D1801 Hemangioma of skin and subcutaneous tissue: Secondary | ICD-10-CM | POA: Diagnosis not present

## 2020-10-11 DIAGNOSIS — M9905 Segmental and somatic dysfunction of pelvic region: Secondary | ICD-10-CM | POA: Diagnosis not present

## 2020-10-11 DIAGNOSIS — M9903 Segmental and somatic dysfunction of lumbar region: Secondary | ICD-10-CM | POA: Diagnosis not present

## 2020-10-11 DIAGNOSIS — M9902 Segmental and somatic dysfunction of thoracic region: Secondary | ICD-10-CM | POA: Diagnosis not present

## 2020-10-11 DIAGNOSIS — M5414 Radiculopathy, thoracic region: Secondary | ICD-10-CM | POA: Diagnosis not present

## 2020-11-08 DIAGNOSIS — M9902 Segmental and somatic dysfunction of thoracic region: Secondary | ICD-10-CM | POA: Diagnosis not present

## 2020-11-08 DIAGNOSIS — M9905 Segmental and somatic dysfunction of pelvic region: Secondary | ICD-10-CM | POA: Diagnosis not present

## 2020-11-08 DIAGNOSIS — M9903 Segmental and somatic dysfunction of lumbar region: Secondary | ICD-10-CM | POA: Diagnosis not present

## 2020-11-08 DIAGNOSIS — M5414 Radiculopathy, thoracic region: Secondary | ICD-10-CM | POA: Diagnosis not present

## 2020-11-20 DIAGNOSIS — H04123 Dry eye syndrome of bilateral lacrimal glands: Secondary | ICD-10-CM | POA: Diagnosis not present

## 2020-11-20 DIAGNOSIS — G43B0 Ophthalmoplegic migraine, not intractable: Secondary | ICD-10-CM | POA: Diagnosis not present

## 2020-11-20 DIAGNOSIS — H53143 Visual discomfort, bilateral: Secondary | ICD-10-CM | POA: Diagnosis not present

## 2020-11-20 DIAGNOSIS — H10413 Chronic giant papillary conjunctivitis, bilateral: Secondary | ICD-10-CM | POA: Diagnosis not present

## 2020-12-02 DIAGNOSIS — M9905 Segmental and somatic dysfunction of pelvic region: Secondary | ICD-10-CM | POA: Diagnosis not present

## 2020-12-02 DIAGNOSIS — M9903 Segmental and somatic dysfunction of lumbar region: Secondary | ICD-10-CM | POA: Diagnosis not present

## 2020-12-02 DIAGNOSIS — M5414 Radiculopathy, thoracic region: Secondary | ICD-10-CM | POA: Diagnosis not present

## 2020-12-02 DIAGNOSIS — M9902 Segmental and somatic dysfunction of thoracic region: Secondary | ICD-10-CM | POA: Diagnosis not present

## 2020-12-05 DIAGNOSIS — M9905 Segmental and somatic dysfunction of pelvic region: Secondary | ICD-10-CM | POA: Diagnosis not present

## 2020-12-05 DIAGNOSIS — M9902 Segmental and somatic dysfunction of thoracic region: Secondary | ICD-10-CM | POA: Diagnosis not present

## 2020-12-05 DIAGNOSIS — M9903 Segmental and somatic dysfunction of lumbar region: Secondary | ICD-10-CM | POA: Diagnosis not present

## 2020-12-05 DIAGNOSIS — M5414 Radiculopathy, thoracic region: Secondary | ICD-10-CM | POA: Diagnosis not present

## 2020-12-09 DIAGNOSIS — M5414 Radiculopathy, thoracic region: Secondary | ICD-10-CM | POA: Diagnosis not present

## 2020-12-09 DIAGNOSIS — M9902 Segmental and somatic dysfunction of thoracic region: Secondary | ICD-10-CM | POA: Diagnosis not present

## 2020-12-09 DIAGNOSIS — M9905 Segmental and somatic dysfunction of pelvic region: Secondary | ICD-10-CM | POA: Diagnosis not present

## 2020-12-09 DIAGNOSIS — M9903 Segmental and somatic dysfunction of lumbar region: Secondary | ICD-10-CM | POA: Diagnosis not present

## 2020-12-12 DIAGNOSIS — M5414 Radiculopathy, thoracic region: Secondary | ICD-10-CM | POA: Diagnosis not present

## 2020-12-12 DIAGNOSIS — M9903 Segmental and somatic dysfunction of lumbar region: Secondary | ICD-10-CM | POA: Diagnosis not present

## 2020-12-12 DIAGNOSIS — M9902 Segmental and somatic dysfunction of thoracic region: Secondary | ICD-10-CM | POA: Diagnosis not present

## 2020-12-12 DIAGNOSIS — M9905 Segmental and somatic dysfunction of pelvic region: Secondary | ICD-10-CM | POA: Diagnosis not present

## 2020-12-19 DIAGNOSIS — M9903 Segmental and somatic dysfunction of lumbar region: Secondary | ICD-10-CM | POA: Diagnosis not present

## 2020-12-19 DIAGNOSIS — M9905 Segmental and somatic dysfunction of pelvic region: Secondary | ICD-10-CM | POA: Diagnosis not present

## 2020-12-19 DIAGNOSIS — M5414 Radiculopathy, thoracic region: Secondary | ICD-10-CM | POA: Diagnosis not present

## 2020-12-19 DIAGNOSIS — M9902 Segmental and somatic dysfunction of thoracic region: Secondary | ICD-10-CM | POA: Diagnosis not present

## 2021-01-13 ENCOUNTER — Other Ambulatory Visit: Payer: Self-pay

## 2021-01-13 DIAGNOSIS — N939 Abnormal uterine and vaginal bleeding, unspecified: Secondary | ICD-10-CM

## 2021-01-13 MED ORDER — NORETHINDRONE ACET-ETHINYL EST 1.5-30 MG-MCG PO TABS: 1.0000 | ORAL_TABLET | Freq: Every day | ORAL | 3 refills | Status: DC

## 2021-01-15 DIAGNOSIS — M9902 Segmental and somatic dysfunction of thoracic region: Secondary | ICD-10-CM | POA: Diagnosis not present

## 2021-01-15 DIAGNOSIS — M5414 Radiculopathy, thoracic region: Secondary | ICD-10-CM | POA: Diagnosis not present

## 2021-01-15 DIAGNOSIS — M9905 Segmental and somatic dysfunction of pelvic region: Secondary | ICD-10-CM | POA: Diagnosis not present

## 2021-01-15 DIAGNOSIS — M9903 Segmental and somatic dysfunction of lumbar region: Secondary | ICD-10-CM | POA: Diagnosis not present

## 2021-02-13 DIAGNOSIS — M5414 Radiculopathy, thoracic region: Secondary | ICD-10-CM | POA: Diagnosis not present

## 2021-02-13 DIAGNOSIS — M9902 Segmental and somatic dysfunction of thoracic region: Secondary | ICD-10-CM | POA: Diagnosis not present

## 2021-02-13 DIAGNOSIS — M9905 Segmental and somatic dysfunction of pelvic region: Secondary | ICD-10-CM | POA: Diagnosis not present

## 2021-02-13 DIAGNOSIS — M9903 Segmental and somatic dysfunction of lumbar region: Secondary | ICD-10-CM | POA: Diagnosis not present

## 2021-02-24 ENCOUNTER — Other Ambulatory Visit: Payer: Self-pay | Admitting: Family Medicine

## 2021-02-24 ENCOUNTER — Telehealth: Payer: Self-pay | Admitting: Family Medicine

## 2021-02-24 DIAGNOSIS — Z1231 Encounter for screening mammogram for malignant neoplasm of breast: Secondary | ICD-10-CM

## 2021-02-24 NOTE — Telephone Encounter (Signed)
Patient called stating she would like get a mammogram done this year. She called imaging but they need orders first. Please advice.

## 2021-02-25 NOTE — Telephone Encounter (Signed)
Pt made aware. Pt will call to set up appointment.

## 2021-03-01 ENCOUNTER — Telehealth (HOSPITAL_BASED_OUTPATIENT_CLINIC_OR_DEPARTMENT_OTHER): Payer: Self-pay

## 2021-03-13 DIAGNOSIS — M5414 Radiculopathy, thoracic region: Secondary | ICD-10-CM | POA: Diagnosis not present

## 2021-03-13 DIAGNOSIS — M9905 Segmental and somatic dysfunction of pelvic region: Secondary | ICD-10-CM | POA: Diagnosis not present

## 2021-03-13 DIAGNOSIS — M9903 Segmental and somatic dysfunction of lumbar region: Secondary | ICD-10-CM | POA: Diagnosis not present

## 2021-03-13 DIAGNOSIS — M9902 Segmental and somatic dysfunction of thoracic region: Secondary | ICD-10-CM | POA: Diagnosis not present

## 2021-03-19 ENCOUNTER — Encounter: Payer: Self-pay | Admitting: Obstetrics & Gynecology

## 2021-03-19 ENCOUNTER — Other Ambulatory Visit: Payer: Self-pay

## 2021-03-19 ENCOUNTER — Ambulatory Visit (INDEPENDENT_AMBULATORY_CARE_PROVIDER_SITE_OTHER): Payer: BC Managed Care – PPO | Admitting: Obstetrics & Gynecology

## 2021-03-19 ENCOUNTER — Other Ambulatory Visit (HOSPITAL_COMMUNITY)
Admission: RE | Admit: 2021-03-19 | Discharge: 2021-03-19 | Disposition: A | Discharge status: Home / Self Care | Payer: BC Managed Care – PPO | Source: Ambulatory Visit | Attending: Obstetrics & Gynecology | Admitting: Obstetrics & Gynecology

## 2021-03-19 VITALS — BP 145/83 | HR 64 | Ht 66.0 in | Wt 145.0 lb

## 2021-03-19 DIAGNOSIS — Z01419 Encounter for gynecological examination (general) (routine) without abnormal findings: Secondary | ICD-10-CM | POA: Diagnosis not present

## 2021-03-19 DIAGNOSIS — G4700 Insomnia, unspecified: Secondary | ICD-10-CM

## 2021-03-19 NOTE — Progress Notes (Addendum)
Subjective:     [name] [name] is a 46 y.o. female here for a routine exam.  Current complaints: Pt reports issues with insomnia. Her issue is related more to staying asleep. She often sleeps only a few hours her night. She does have some caffeine use. She has been taking Melatonin with no relief.  S   Gynecologic History No LMP recorded. (Menstrual status: Oral contraceptives). Contraception: vasectomy Last Pap: 03/22/2016. Results were: normal Last mammogram: within the last 2-3 years. Outside facility. Normal.   Obstetric History OB History  Gravida Para Term Preterm AB Living  2 2 2     2   SAB IAB Ectopic Multiple Live Births          2    # Outcome Date GA Lbr Len/2nd Weight Sex Delivery Anes PTL Lv  2 Term 2008 [redacted]w[redacted]d  F Vag-Spont EPI N LIV  1 Term 2006 364w0d F CS-LTranv EPI N LIV     The following portions of the patient's history were reviewed and updated as appropriate: allergies, current medications, past family history, past medical history, past social history, past surgical history, and problem list.  Review of Systems Pertinent items are noted in HPI.    Objective:  BP (!) 145/83   Pulse 64   Ht 5' 6"  (1.676 m)   Wt 145 lb (65.8 kg)   BMI 23.40 kg/m   General Appearance:    Alert, cooperative, no distress, appears stated age  Head:    Normocephalic, without obvious abnormality, atraumatic  Eyes:    conjunctiva/corneas clear, EOM's intact, both eyes  Ears:    Normal external ear canals, both ears  Nose:   Nares normal, septum midline, mucosa normal, no drainage    or sinus tenderness  Throat:   Lips, mucosa, and tongue normal; teeth and gums normal  Neck:   Supple, symmetrical, trachea midline, no adenopathy;    thyroid:  no enlargement/tenderness/nodules  Back:     Symmetric, no curvature, ROM normal, no CVA tenderness  Lungs:     respirations unlabored  Chest Wall:    No tenderness or deformity   Heart:    Regular rate and rhythm  Breast Exam:    No  tenderness, masses, or nipple abnormality  Abdomen:     Soft, non-tender, bowel sounds active all four quadrants,    no masses, no organomegaly  Genitalia:    Normal female without lesion, discharge or tenderness     Extremities:   Extremities normal, atraumatic, no cyanosis or edema  Pulses:   2+ and symmetric all extremities  Skin:   Skin color, texture, turgor normal, no rashes or lesions     Assessment:    Healthy female exam.  Insomnia   Plan:   Diagnoses and all orders for this visit:  Encounter for well woman exam with routine gynecological exam -     Cytology - PAP( [facility])  Insomnia, unspecified type  D/w pt sleep hygeine. Also recommneded cessation of all caffeine.   If sx cont will rec sleep study.  F/u in 1 year or sooner prn   [name] [name] L. Harraway-Smith, M.D., FACherlynn June

## 2021-03-20 ENCOUNTER — Encounter: Payer: BC Managed Care – PPO | Admitting: Family Medicine

## 2021-03-20 ENCOUNTER — Encounter: Payer: Self-pay | Admitting: Family Medicine

## 2021-03-20 ENCOUNTER — Encounter (HOSPITAL_BASED_OUTPATIENT_CLINIC_OR_DEPARTMENT_OTHER): Payer: Self-pay

## 2021-03-20 ENCOUNTER — Ambulatory Visit (HOSPITAL_BASED_OUTPATIENT_CLINIC_OR_DEPARTMENT_OTHER)
Admission: RE | Admit: 2021-03-20 | Discharge: 2021-03-20 | Disposition: A | Discharge status: Home / Self Care | Payer: BC Managed Care – PPO | Source: Ambulatory Visit | Attending: Family Medicine | Admitting: Family Medicine

## 2021-03-20 ENCOUNTER — Ambulatory Visit (INDEPENDENT_AMBULATORY_CARE_PROVIDER_SITE_OTHER): Payer: BC Managed Care – PPO | Admitting: Family Medicine

## 2021-03-20 VITALS — BP 134/70 | HR 67 | Temp 98.7°F | Resp 16 | Ht 66.0 in | Wt 147.6 lb

## 2021-03-20 DIAGNOSIS — Z1231 Encounter for screening mammogram for malignant neoplasm of breast: Secondary | ICD-10-CM | POA: Diagnosis not present

## 2021-03-20 DIAGNOSIS — Z0000 Encounter for general adult medical examination without abnormal findings: Secondary | ICD-10-CM | POA: Diagnosis not present

## 2021-03-20 NOTE — Progress Notes (Signed)
Subjective:     [name] [name] is a 46 y.o. female and is here for a comprehensive physical exam. The patient reports problems - hot flashes -- she sees her ob/gyn .  Social History   Socioeconomic History   Marital status: Married    Spouse name: Not on file   Number of children: 2   Years of education: 16   Highest education level: Bachelor's degree (e.g., BA, AB, BS)  Occupational History   Occupation: Pension scheme manager: [employer]  Tobacco Use   Smoking status: Former    Types: Cigarettes    Quit date: 01/19/1998    Years since quitting: 23.1   Smokeless tobacco: Never   Tobacco comments:    About age 33   Vaping Use   Vaping Use: Never used  Substance and Sexual Activity   Alcohol use: Yes    Alcohol/week: 3.0 - 4.0 standard drinks    Types: 3 - 4 Glasses of wine per week    Comment: few glasses of wine on weekends   Drug use: No   Sexual activity: Yes    Partners: Male  Other Topics Concern   Not on file  Social History Narrative   Married with 2 children   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 [place]   Occasional alcohol no drugs or tobacco use at this time   Exercise-- 2 x a week    Social Determinants of Health   Financial Resource Strain: Not on file  Food Insecurity: Not on file  Transportation Needs: Not on file  Physical Activity: Not on file  Stress: Not on file  Social Connections: Not on file  Intimate Partner Violence: Not on file   Health Maintenance  Topic Date Due   COVID-19 Vaccine (4 - Booster for [place] series) 03/04/2020   PAP SMEAR-Modifier  02/22/2022   MAMMOGRAM  03/20/2022   TETANUS/TDAP  03/15/2030   Hepatitis C Screening  Completed   HIV Screening  Completed   HPV VACCINES  Aged Out    The following portions of the patient's history were reviewed and updated as appropriate: She  has a past medical history of Allergy, Basal cell carcinoma, Celiac disease (2009), Functional dyspepsia  (09/05/2012), Gastritis, GERD (gastroesophageal reflux disease) (08/08/2012), Guillain Barr syndrome (04/01/2007), Hyperthyroidism (04/01/2007), and Hypoglycemia, unspecified (10/26/2007). She does not have any pertinent problems on file. She  has a past surgical history that includes Cesarean section; Wisdom tooth extraction; Colonoscopy; Esophagogastroduodenoscopy; and Skin cancer excision. Her family history includes Autoimmune disease in an other family member; Bipolar disorder in her sister; Depression in her father; Diabetes in her father; Hyperlipidemia in her father; Hypertension in her father, mother, sister, and another family member; Leukemia (age of onset: 93) in her mother; Lupus in her cousin; Memory loss in her mother; Multiple sclerosis in her cousin; Prostate cancer (age of onset: 38) in her father; Stroke in her maternal grandmother and mother. She  reports that she quit smoking about 23 years ago. Her smoking use included cigarettes. She has never used smokeless tobacco. She reports current alcohol use of about 3.0 - 4.0 standard drinks per week. She reports that she does not use drugs. She has a current medication list which includes the following prescription(s): clindamycin, iron, glucosamine-chondroitin, multivitamin, norethindrone acetate-ethinyl estradiol, probiotic product, and tretinoin. Current Outpatient Medications on File Prior to Visit  Medication Sig Dispense Refill   clindamycin (CLEOCIN T) 1 % SWAB Apply 1 each topically  2 (two) times daily.     Ferrous Sulfate (IRON) 325 (65 FE) MG TABS Take 65 mg by mouth daily.     glucosamine-chondroitin 500-400 MG tablet Take 1 tablet by mouth 3 (three) times daily.     Multiple Vitamin (MULTIVITAMIN) tablet Take 1 tablet by mouth daily.     Norethindrone Acetate-Ethinyl Estradiol (LOESTRIN 1.5/30, 21,) 1.5-30 MG-MCG tablet Take 1 tablet by mouth daily. 28 tablet 3   Probiotic Product (PROBIOTIC ADVANCED PO) Take by mouth daily.      tretinoin (RETIN-A) 0.05 % cream Apply topically at bedtime. 45 g 0   No current facility-administered medications on file prior to visit.   She is allergic to influenza vac split quad..  Review of Systems Review of Systems  Constitutional: Negative for activity change, appetite change and fatigue.  HENT: Negative for hearing loss, congestion, tinnitus and ear discharge.  dentist [id number]: Negative for visual disturbance (see optho q1y -- vision corrected to 20/20 with glasses).  Respiratory: Negative for cough, chest tightness and shortness of breath.   Cardiovascular: Negative for chest pain, palpitations and leg swelling.  Gastrointestinal: Negative for abdominal pain, diarrhea, constipation and abdominal distention.  Genitourinary: Negative for urgency, frequency, decreased urine volume and difficulty urinating.  Musculoskeletal: Negative for back pain, arthralgias and gait problem.  Skin: Negative for color change, pallor and rash.  Neurological: Negative for dizziness, light-headedness, numbness and headaches.  Hematological: Negative for adenopathy. Does not bruise/bleed easily.  Psychiatric/Behavioral: Negative for suicidal ideas, confusion, sleep disturbance, self-injury, dysphoric mood, decreased concentration and agitation.      Objective:    BP 134/70 (BP Location: Left Arm, Patient Position: Sitting, Cuff Size: Normal)    Pulse 67    Temp 98.7 F (37.1 C) (Oral)    Resp 16    Ht 5' 6"  (1.676 m)    Wt 147 lb 9.6 oz (67 kg)    SpO2 98%    BMI 23.82 kg/m  General appearance: alert, cooperative, appears stated age, and no distress Head: Normocephalic, without obvious abnormality, atraumatic Eyes: conjunctivae/corneas clear. PERRL, EOM's intact. Fundi benign. Ears: normal TM's and external ear canals both ears Nose: Nares normal. Septum midline. Mucosa normal. No drainage or sinus tenderness. Throat: lips, mucosa, and tongue normal; teeth and gums normal Neck: no  adenopathy, no carotid bruit, no JVD, supple, symmetrical, trachea midline, and thyroid not enlarged, symmetric, no tenderness/mass/nodules Back: symmetric, no curvature. ROM normal. No CVA tenderness. Lungs: clear to auscultation bilaterally Heart: regular rate and rhythm, S1, S2 normal, no murmur, click, rub or gallop Abdomen: soft, non-tender; bowel sounds normal; no masses,  no organomegaly Extremities: extremities normal, atraumatic, no cyanosis or edema Pulses: 2+ and symmetric Skin: Skin color, texture, turgor normal. No rashes or lesions Lymph nodes: Cervical, supraclavicular, and axillary nodes normal. Neurologic: Alert and oriented X 3, normal strength and tone. Normal symmetric reflexes. Normal coordination and gait    Assessment:    Healthy female exam.      Plan:    Ghm utd Check labs  See After Visit Summary for Counseling Recommendations   1. Preventative health care See above  - TSH - CBC with Differential/Platelet - Comprehensive metabolic panel - Lipid panel

## 2021-03-20 NOTE — Patient Instructions (Signed)

## 2021-03-21 LAB — CYTOLOGY - PAP
Comment: NEGATIVE
Diagnosis: NEGATIVE
High risk HPV: NEGATIVE

## 2021-03-21 LAB — CBC WITH DIFFERENTIAL/PLATELET
Basophils Absolute: 0 10*3/uL (ref 0.0–0.1)
Basophils Relative: 0.8 % (ref 0.0–3.0)
Eosinophils Absolute: 0.1 10*3/uL (ref 0.0–0.7)
Eosinophils Relative: 1.3 % (ref 0.0–5.0)
HCT: 41.2 % (ref 36.0–46.0)
Hemoglobin: 13.8 g/dL (ref 12.0–15.0)
Lymphocytes Relative: 37.1 % (ref 12.0–46.0)
Lymphs Abs: 2 10*3/uL (ref 0.7–4.0)
MCHC: 33.4 g/dL (ref 30.0–36.0)
MCV: 92.8 fl (ref 78.0–100.0)
Monocytes Absolute: 0.4 10*3/uL (ref 0.1–1.0)
Monocytes Relative: 6.8 % (ref 3.0–12.0)
Neutro Abs: 2.9 10*3/uL (ref 1.4–7.7)
Neutrophils Relative %: 54 % (ref 43.0–77.0)
Platelets: 351 10*3/uL (ref 150.0–400.0)
RBC: 4.44 Mil/uL (ref 3.87–5.11)
RDW: 13.7 % (ref 11.5–15.5)
WBC: 5.3 10*3/uL (ref 4.0–10.5)

## 2021-03-21 LAB — COMPREHENSIVE METABOLIC PANEL
ALT: 26 U/L (ref 0–35)
AST: 20 U/L (ref 0–37)
Albumin: 4.4 g/dL (ref 3.5–5.2)
Alkaline Phosphatase: 27 U/L — ABNORMAL LOW (ref 39–117)
BUN: 12 mg/dL (ref 6–23)
CO2: 30 mEq/L (ref 19–32)
Calcium: 10 mg/dL (ref 8.4–10.5)
Chloride: 102 mEq/L (ref 96–112)
Creatinine, Ser: 1.27 mg/dL — ABNORMAL HIGH (ref 0.40–1.20)
GFR: 51.35 mL/min — ABNORMAL LOW (ref >60.00)
Glucose, Bld: 82 mg/dL (ref 70–99)
Potassium: 4.2 mEq/L (ref 3.5–5.1)
Sodium: 138 mEq/L (ref 135–145)
Total Bilirubin: 0.5 mg/dL (ref 0.2–1.2)
Total Protein: 6.9 g/dL (ref 6.0–8.3)

## 2021-03-21 LAB — LIPID PANEL
Cholesterol: 182 mg/dL (ref 0–200)
HDL: 64.2 mg/dL (ref >39.00)
LDL Cholesterol: 102 mg/dL — ABNORMAL HIGH (ref 0–99)
NonHDL: 118.06
Total CHOL/HDL Ratio: 3
Triglycerides: 82 mg/dL (ref 0.0–149.0)
VLDL: 16.4 mg/dL (ref 0.0–40.0)

## 2021-03-21 LAB — TSH: TSH: 2.03 u[IU]/mL (ref 0.35–5.50)

## 2021-03-23 ENCOUNTER — Other Ambulatory Visit: Payer: Self-pay | Admitting: Family Medicine

## 2021-03-23 DIAGNOSIS — N289 Disorder of kidney and ureter, unspecified: Secondary | ICD-10-CM

## 2021-03-25 ENCOUNTER — Other Ambulatory Visit: Payer: Self-pay

## 2021-03-25 DIAGNOSIS — N939 Abnormal uterine and vaginal bleeding, unspecified: Secondary | ICD-10-CM

## 2021-03-25 MED ORDER — NORETHINDRONE ACET-ETHINYL EST 1.5-30 MG-MCG PO TABS: 1.0000 | ORAL_TABLET | Freq: Every day | ORAL | 4 refills | Status: DC

## 2021-03-26 ENCOUNTER — Ambulatory Visit: Payer: BC Managed Care – PPO | Admitting: Family Medicine

## 2021-03-26 ENCOUNTER — Encounter: Payer: Self-pay | Admitting: Family Medicine

## 2021-03-26 VITALS — BP 140/85 | HR 75 | Temp 98.2°F | Ht 66.0 in | Wt 144.6 lb

## 2021-03-26 DIAGNOSIS — J029 Acute pharyngitis, unspecified: Secondary | ICD-10-CM | POA: Diagnosis not present

## 2021-03-26 LAB — POCT INFLUENZA A/B
Influenza A, POC: NEGATIVE
Influenza B, POC: NEGATIVE

## 2021-03-26 LAB — POCT RAPID STREP A (OFFICE): Rapid Strep A Screen: NEGATIVE

## 2021-03-26 NOTE — Progress Notes (Signed)
Acute Office Visit  Subjective:    Patient ID: [name] [name], female    DOB: 1975/10/17, 46 y.o.   MRN: [id number]  CC: sore throat  HPI Patient is in today for sore throat.  Patient reports she started with a mild sore throat last night, but it woke her up around 2am with severe pain and her lymph nodes felt swollen, making her neck feel stiff. She also noticed a lot of post nasal drainage this morning and felt slightly clammy which improved after taking ibuprofen. She denies any sick contacts, fevers, chills, GI/GU symptoms, headache, cough, chest pain, dyspnea, wheezing. She had a negative COVID test this morning.      Past Medical History:  Diagnosis Date   Allergy    Basal cell carcinoma    abdomen   Celiac disease 2009   Functional dyspepsia 09/05/2012   Gastritis    GERD (gastroesophageal reflux disease) 08/08/2012   Guillain Barr syndrome 04/01/2007   After influenza vaccine   Hyperthyroidism 04/01/2007   Hypoglycemia, unspecified 10/26/2007    Past Surgical History:  Procedure Laterality Date   CESAREAN SECTION     COLONOSCOPY     ESOPHAGOGASTRODUODENOSCOPY     SKIN CANCER EXCISION     abdomin   WISDOM TOOTH EXTRACTION      Family History  Problem Relation Age of Onset   Leukemia Mother 25   Hypertension Mother    Stroke Mother    Memory loss Mother    Hypertension Father    Hyperlipidemia Father    Diabetes Father    Prostate cancer Father 41   Depression Father    Hypertension Sister    Bipolar disorder Sister    Stroke Maternal Grandmother    Multiple sclerosis Cousin    Lupus Cousin    Hypertension Other        siblings   Autoimmune disease Other        numerous family members on her mother's side   Colon cancer Neg Hx    Esophageal cancer Neg Hx    Stomach cancer Neg Hx    Rectal cancer Neg Hx     Social History   Socioeconomic History   Marital status: Married    Spouse name: Not on file   Number of children: 2   Years of  education: 16   Highest education level: Bachelor's degree (e.g., BA, AB, BS)  Occupational History   Occupation: Pension scheme manager: [employer]  Tobacco Use   Smoking status: Former    Types: Cigarettes    Quit date: 01/19/1998    Years since quitting: 23.1   Smokeless tobacco: Never   Tobacco comments:    About age 31   Vaping Use   Vaping Use: Never used  Substance and Sexual Activity   Alcohol use: Yes    Alcohol/week: 3.0 - 4.0 standard drinks    Types: 3 - 4 Glasses of wine per week    Comment: few glasses of wine on weekends   Drug use: No   Sexual activity: Yes    Partners: Male  Other Topics Concern   Not on file  Social History Narrative   Married with 2 children   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     Ferrous Sulfate (IRON) 325 (65 FE) MG TABS Take 65 mg by mouth daily.     glucosamine-chondroitin 500-400 MG tablet Take 1 tablet by mouth 3 (three) times daily.     melatonin 5 MG TABS Take 5 mg by mouth.     Multiple Vitamin (MULTIVITAMIN) tablet Take 1 tablet by mouth daily.     Norethindrone Acetate-Ethinyl Estradiol (LOESTRIN 1.5/30, 21,) 1.5-30 MG-MCG tablet Take 1 tablet by mouth daily. 84 tablet 4   Probiotic Product (PROBIOTIC ADVANCED PO) Take by mouth daily.     tretinoin (RETIN-A) 0.05 % cream Apply topically at bedtime. 45 g 0   No facility-administered medications prior to visit.    Allergies  Allergen  Reactions   Influenza Vac Split Quad     REACTION: pt had Joellyn Quails--- can not have flu shot    Review of Systems All review of systems negative except what is listed in the HPI     Objective:    Physical Exam Vitals reviewed.  Constitutional:      Appearance: Normal appearance. She is normal weight.  HENT:     Head: Normocephalic and atraumatic.     Nose: Nose normal.     Mouth/Throat:     Mouth: Mucous membranes are moist.     Pharynx: Posterior oropharyngeal erythema present.     Tonsils: No tonsillar exudate or tonsillar abscesses.  Eyes:     Conjunctiva/sclera: Conjunctivae normal.  Cardiovascular:     Rate and Rhythm: Normal rate and regular rhythm.  Pulmonary:     Effort: Pulmonary effort is normal.     Breath sounds: Normal breath sounds.  Musculoskeletal:     Cervical back: Tenderness present.  Lymphadenopathy:     Cervical: Cervical adenopathy present.  Skin:    General: Skin is warm and dry.  Neurological:     General: No focal deficit present.     Mental Status: She is alert and oriented to person, place, and time. Mental status is at baseline.  Psychiatric:        Mood and Affect: Mood normal.        Behavior: Behavior normal.        Thought Content: Thought content normal.        Judgment: Judgment normal.    BP 140/85    Pulse 75    Temp 98.2 F (36.8 C)    Ht 5' 6"  (1.676 m)    Wt 144 lb 9.6 oz (65.6 kg)    SpO2 98%    BMI 23.34 kg/m  Wt Readings from Last 3 Encounters:  03/26/21 144 lb 9.6 oz (65.6 kg)  03/20/21 147 lb 9.6 oz (67 kg)  03/19/21 145 lb (65.8 kg)    Health Maintenance Due  Topic Date Due   COVID-19 Vaccine (4 - Booster for Pfizer series) 03/04/2020    There are no preventive care reminders to display for this patient.   Lab Results  Component Value Date   TSH 2.03 03/20/2021   Lab Results  Component Value Date   WBC 5.3 03/20/2021   HGB 13.8 03/20/2021  HCT 41.2 03/20/2021   MCV 92.8 03/20/2021   PLT 351.0  03/20/2021   Lab Results  Component Value Date   NA 138 03/20/2021   K 4.2 03/20/2021   CO2 30 03/20/2021   GLUCOSE 82 03/20/2021   BUN 12 03/20/2021   CREATININE 1.27 (H) 03/20/2021   BILITOT 0.5 03/20/2021   ALKPHOS 27 (L) 03/20/2021   AST 20 03/20/2021   ALT 26 03/20/2021   PROT 6.9 03/20/2021   ALBUMIN 4.4 03/20/2021   CALCIUM 10.0 03/20/2021   GFR 51.35 (L) 03/20/2021   Lab Results  Component Value Date   CHOL 182 03/20/2021   Lab Results  Component Value Date   HDL 64.20 03/20/2021   Lab Results  Component Value Date   LDLCALC 102 (H) 03/20/2021   Lab Results  Component Value Date   TRIG 82.0 03/20/2021   Lab Results  Component Value Date   CHOLHDL 3 03/20/2021   No results found for: HGBA1C     Assessment & Plan:   1. Sore throat Flu and Strep negative.  Likely viral pharyngitis. Continue supportive measures including rest, hydration, humidifier use, steam showers, warm compresses to sinuses, warm liquids with lemon and honey, and over-the-counter cough, cold, and analgesics as needed.  See the attachment provided.  Take another home COVID test tomorrow.  If symptoms worsen while out of town, please schedule a virtual visit.  - POCT Influenza A/B - POCT rapid strep A  Follow-up if symptoms worsen or fail to improve.  Terrilyn Saver, NP

## 2021-03-26 NOTE — Patient Instructions (Addendum)
Flu and Strep negative.  Likely viral pharyngitis. Continue supportive measures including rest, hydration, humidifier use, steam showers, warm compresses to sinuses, warm liquids with lemon and honey, and over-the-counter cough, cold, and analgesics as needed.  See the attachment provided.  Take another home COVID test tomorrow.

## 2021-03-31 ENCOUNTER — Encounter: Payer: Self-pay | Admitting: Obstetrics & Gynecology

## 2021-04-08 DIAGNOSIS — M9905 Segmental and somatic dysfunction of pelvic region: Secondary | ICD-10-CM | POA: Diagnosis not present

## 2021-04-08 DIAGNOSIS — M9903 Segmental and somatic dysfunction of lumbar region: Secondary | ICD-10-CM | POA: Diagnosis not present

## 2021-04-08 DIAGNOSIS — M5414 Radiculopathy, thoracic region: Secondary | ICD-10-CM | POA: Diagnosis not present

## 2021-04-08 DIAGNOSIS — M9902 Segmental and somatic dysfunction of thoracic region: Secondary | ICD-10-CM | POA: Diagnosis not present

## 2021-04-10 DIAGNOSIS — M5414 Radiculopathy, thoracic region: Secondary | ICD-10-CM | POA: Diagnosis not present

## 2021-04-10 DIAGNOSIS — M9902 Segmental and somatic dysfunction of thoracic region: Secondary | ICD-10-CM | POA: Diagnosis not present

## 2021-04-10 DIAGNOSIS — M9905 Segmental and somatic dysfunction of pelvic region: Secondary | ICD-10-CM | POA: Diagnosis not present

## 2021-04-10 DIAGNOSIS — M9903 Segmental and somatic dysfunction of lumbar region: Secondary | ICD-10-CM | POA: Diagnosis not present

## 2021-04-14 DIAGNOSIS — M9905 Segmental and somatic dysfunction of pelvic region: Secondary | ICD-10-CM | POA: Diagnosis not present

## 2021-04-14 DIAGNOSIS — M9902 Segmental and somatic dysfunction of thoracic region: Secondary | ICD-10-CM | POA: Diagnosis not present

## 2021-04-14 DIAGNOSIS — M5414 Radiculopathy, thoracic region: Secondary | ICD-10-CM | POA: Diagnosis not present

## 2021-04-14 DIAGNOSIS — M9903 Segmental and somatic dysfunction of lumbar region: Secondary | ICD-10-CM | POA: Diagnosis not present

## 2021-04-17 DIAGNOSIS — M5414 Radiculopathy, thoracic region: Secondary | ICD-10-CM | POA: Diagnosis not present

## 2021-04-17 DIAGNOSIS — M9903 Segmental and somatic dysfunction of lumbar region: Secondary | ICD-10-CM | POA: Diagnosis not present

## 2021-04-17 DIAGNOSIS — M9902 Segmental and somatic dysfunction of thoracic region: Secondary | ICD-10-CM | POA: Diagnosis not present

## 2021-04-17 DIAGNOSIS — M9905 Segmental and somatic dysfunction of pelvic region: Secondary | ICD-10-CM | POA: Diagnosis not present

## 2021-04-21 DIAGNOSIS — M9905 Segmental and somatic dysfunction of pelvic region: Secondary | ICD-10-CM | POA: Diagnosis not present

## 2021-04-21 DIAGNOSIS — M5414 Radiculopathy, thoracic region: Secondary | ICD-10-CM | POA: Diagnosis not present

## 2021-04-21 DIAGNOSIS — M9902 Segmental and somatic dysfunction of thoracic region: Secondary | ICD-10-CM | POA: Diagnosis not present

## 2021-04-21 DIAGNOSIS — M9903 Segmental and somatic dysfunction of lumbar region: Secondary | ICD-10-CM | POA: Diagnosis not present

## 2021-04-28 DIAGNOSIS — M9902 Segmental and somatic dysfunction of thoracic region: Secondary | ICD-10-CM | POA: Diagnosis not present

## 2021-04-28 DIAGNOSIS — M5414 Radiculopathy, thoracic region: Secondary | ICD-10-CM | POA: Diagnosis not present

## 2021-04-28 DIAGNOSIS — M9905 Segmental and somatic dysfunction of pelvic region: Secondary | ICD-10-CM | POA: Diagnosis not present

## 2021-04-28 DIAGNOSIS — M9903 Segmental and somatic dysfunction of lumbar region: Secondary | ICD-10-CM | POA: Diagnosis not present

## 2021-05-12 DIAGNOSIS — M9905 Segmental and somatic dysfunction of pelvic region: Secondary | ICD-10-CM | POA: Diagnosis not present

## 2021-05-12 DIAGNOSIS — M5414 Radiculopathy, thoracic region: Secondary | ICD-10-CM | POA: Diagnosis not present

## 2021-05-12 DIAGNOSIS — M9903 Segmental and somatic dysfunction of lumbar region: Secondary | ICD-10-CM | POA: Diagnosis not present

## 2021-05-12 DIAGNOSIS — M9902 Segmental and somatic dysfunction of thoracic region: Secondary | ICD-10-CM | POA: Diagnosis not present

## 2021-05-15 ENCOUNTER — Encounter: Payer: Self-pay | Admitting: Obstetrics & Gynecology

## 2021-05-27 ENCOUNTER — Other Ambulatory Visit: Payer: Self-pay

## 2021-05-27 DIAGNOSIS — G479 Sleep disorder, unspecified: Secondary | ICD-10-CM

## 2021-06-02 DIAGNOSIS — M9902 Segmental and somatic dysfunction of thoracic region: Secondary | ICD-10-CM | POA: Diagnosis not present

## 2021-06-02 DIAGNOSIS — M9903 Segmental and somatic dysfunction of lumbar region: Secondary | ICD-10-CM | POA: Diagnosis not present

## 2021-06-02 DIAGNOSIS — M9905 Segmental and somatic dysfunction of pelvic region: Secondary | ICD-10-CM | POA: Diagnosis not present

## 2021-06-02 DIAGNOSIS — M5414 Radiculopathy, thoracic region: Secondary | ICD-10-CM | POA: Diagnosis not present

## 2021-06-30 DIAGNOSIS — M9905 Segmental and somatic dysfunction of pelvic region: Secondary | ICD-10-CM | POA: Diagnosis not present

## 2021-06-30 DIAGNOSIS — M9903 Segmental and somatic dysfunction of lumbar region: Secondary | ICD-10-CM | POA: Diagnosis not present

## 2021-06-30 DIAGNOSIS — M5414 Radiculopathy, thoracic region: Secondary | ICD-10-CM | POA: Diagnosis not present

## 2021-06-30 DIAGNOSIS — M9902 Segmental and somatic dysfunction of thoracic region: Secondary | ICD-10-CM | POA: Diagnosis not present

## 2021-07-17 ENCOUNTER — Telehealth: Payer: Self-pay

## 2021-07-17 NOTE — Telephone Encounter (Signed)
Patient has referral for sleep consultation to [facility]. Multiple attempts have been made from myself to obtain the correct documentation needed to get this patient scheduled. Pt even had a call with myself and someone from the referring office regarding what was needed. I still have not received office visit notes explaining the patient's sleep concerns and can not get in touch with anyone from the referring office to have this corrected. Rightfully so, the patient is upset that this is continuing to delay her getting scheduled with our office. Will reach back out to patient and let her know what is going on.

## 2021-07-18 ENCOUNTER — Encounter: Payer: Self-pay | Admitting: Family Medicine

## 2021-07-18 ENCOUNTER — Telehealth (INDEPENDENT_AMBULATORY_CARE_PROVIDER_SITE_OTHER): Payer: BC Managed Care – PPO | Admitting: Family Medicine

## 2021-07-18 DIAGNOSIS — G4700 Insomnia, unspecified: Secondary | ICD-10-CM

## 2021-07-18 MED ORDER — TRAZODONE HCL 50 MG PO TABS: 25.0000 mg | ORAL_TABLET | Freq: Every evening | ORAL | 3 refills | Status: DC | PRN

## 2021-07-18 NOTE — Progress Notes (Signed)
MyChart Video Visit    Virtual Visit via Video Note   This visit type was conducted due to national recommendations for restrictions regarding the COVID-19 Pandemic (e.g. social distancing) in an effort to limit this patient's exposure and mitigate transmission in our community. This patient is at least at moderate risk for complications without adequate follow up. This format is felt to be most appropriate for this patient at this time. Physical exam was limited by quality of the video and audio technology used for the visit. [name] [name] was able to get the patient set up on a video visit.  Patient location: Home Patient and provider in visit Provider location: Office  I discussed the limitations of evaluation and management by telemedicine and the availability of in person appointments. The patient expressed understanding and agreed to proceed.  Visit Date: 07/18/2021  Today's healthcare provider: Ann Held, DO     Subjective:    Patient ID: [name] [name], female    DOB: 30-Aug-1975, 46 y.o.   MRN: [id number]  No chief complaint on file.  HPI Patient is in today for a virtual office visit.   Patient complains of insomnia and difficulty getting quality sleep after getting up. She can sleep for 3-4 hours before having to use the restroom. Afterwards, she states that she cannot fall into deep sleep. She wakes up from her bed because she is too hot or too cold during the sleep. She is taking 10 mg of melatonin but symptoms still persist. She reports consulting with Dr. Lady Saucier. She reports Dr.smith recommended caffeine reduction and reducing melatonin pills from 10 mg to 5 mg. She states that her symptoms are unchanged. She states Dr. Tamala Julian referred her to complete a sleep study but had a difficulty coordinating it with the referral coordinator . She is interested in getting referred today for the sleep study. She denies hot flashes. She reports snoring duirng  the night.   She reports imporvment on menopause concerns  Past Medical History:  Diagnosis Date   Allergy    Basal cell carcinoma    abdomen   Celiac disease 2009   Functional dyspepsia 09/05/2012   Gastritis    GERD (gastroesophageal reflux disease) 08/08/2012   Guillain Barr syndrome 04/01/2007   After influenza vaccine   Hyperthyroidism 04/01/2007   Hypoglycemia, unspecified 10/26/2007    Past Surgical History:  Procedure Laterality Date   CESAREAN SECTION     COLONOSCOPY     ESOPHAGOGASTRODUODENOSCOPY     SKIN CANCER EXCISION     abdomin   WISDOM TOOTH EXTRACTION      Family History  Problem Relation Age of Onset   Leukemia Mother 47   Hypertension Mother    Stroke Mother    Memory loss Mother    Hypertension Father    Hyperlipidemia Father    Diabetes Father    Prostate cancer Father 47   Depression Father    Hypertension Sister    Bipolar disorder Sister    Stroke Maternal Grandmother    Multiple sclerosis Cousin    Lupus Cousin    Hypertension Other        siblings   Autoimmune disease Other        numerous family members on her mother's side   Colon cancer Neg Hx    Esophageal cancer Neg Hx    Stomach cancer Neg Hx    Rectal cancer Neg Hx     Social History  Socioeconomic History   Marital status: Married    Spouse name: Not on file   Number of children: 2   Years of education: 16   Highest education level: Bachelor's degree (e.g., BA, AB, BS)  Occupational History   Occupation: Pension scheme manager: [employer]  Tobacco Use   Smoking status: Former    Types: Cigarettes    Quit date: 01/19/1998    Years since quitting: 23.5   Smokeless tobacco: Never   Tobacco comments:    About age 74   Vaping Use   Vaping Use: Never used  Substance and Sexual Activity   Alcohol use: Yes    Alcohol/week: 3.0 - 4.0 standard drinks    Types: 3 - 4 Glasses of wine per week    Comment: few glasses of wine on weekends   Drug use: No    Sexual activity: Yes    Partners: Male  Other Topics Concern   Not on file  Social History Narrative   Married with 2 children   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     Ferrous Sulfate (IRON) 325 (65 FE) MG TABS Take 65 mg by mouth daily.     glucosamine-chondroitin 500-400 MG tablet Take 1 tablet by mouth 3 (three) times daily.     melatonin 5 MG TABS Take 5 mg by mouth.     Multiple Vitamin (MULTIVITAMIN) tablet Take 1 tablet by mouth daily.     Norethindrone Acetate-Ethinyl Estradiol (LOESTRIN 1.5/30, 21,) 1.5-30 MG-MCG tablet Take 1 tablet by mouth daily. 84 tablet 4   Probiotic Product (PROBIOTIC ADVANCED PO) Take by mouth daily.     tretinoin (RETIN-A) 0.05 % cream Apply topically at bedtime. 45 g 0   No facility-administered medications prior to visit.    Allergies  Allergen Reactions   Influenza Vac Split Quad     REACTION: pt had Joellyn Quails--- can not have flu shot    Review of Systems  Constitutional:  Negative for fever and malaise/fatigue.       (-) hot flashes  HENT:  Negative for congestion.   Eyes:  Negative for blurred vision.  Respiratory:  Negative for shortness of breath.   Cardiovascular:  Negative for chest pain, palpitations and leg swelling.  Gastrointestinal:  Negative for abdominal pain, blood in stool and nausea.  Genitourinary:  Negative for dysuria and frequency.  Musculoskeletal:  Negative for falls.  Skin:   Negative for rash.  Neurological:  Negative for dizziness, loss of consciousness and headaches.  Endo/Heme/Allergies:  Negative for environmental allergies.  Psychiatric/Behavioral:  Negative for depression. The patient has insomnia. The patient is not nervous/anxious.       Objective:    Physical Exam Vitals and nursing note reviewed.  Neurological:     General: No focal deficit present.     Mental Status: She is oriented to person, place, and time.  Mental status is at baseline.  Psychiatric:        Mood and Affect: Mood normal.        Thought Content: Thought content normal.    There were no vitals taken for this visit. Wt Readings from Last 3 Encounters:  03/26/21 144 lb 9.6 oz (65.6 kg)  03/20/21 147 lb 9.6 oz (67 kg)  03/19/21 145 lb (65.8 kg)    Diabetic Foot Exam - Simple   No data filed    Lab Results  Component Value Date   WBC 5.3 03/20/2021   HGB 13.8 03/20/2021   HCT 41.2 03/20/2021   PLT 351.0 03/20/2021   GLUCOSE 82 03/20/2021   CHOL 182 03/20/2021   TRIG 82.0 03/20/2021   HDL 64.20 03/20/2021   LDLCALC 102 (H) 03/20/2021   ALT 26 03/20/2021   AST 20 03/20/2021   NA 138 03/20/2021   K 4.2 03/20/2021   CL 102 03/20/2021   CREATININE 1.27 (H) 03/20/2021   BUN 12 03/20/2021   CO2 30 03/20/2021   TSH 2.03 03/20/2021    Lab Results  Component Value Date   TSH 2.03 03/20/2021   Lab Results  Component Value Date   WBC 5.3 03/20/2021   HGB 13.8 03/20/2021   HCT 41.2 03/20/2021   MCV 92.8 03/20/2021   PLT 351.0 03/20/2021   Lab Results  Component Value Date   NA 138 03/20/2021   K 4.2 03/20/2021   CO2 30 03/20/2021   GLUCOSE 82 03/20/2021   BUN 12 03/20/2021   CREATININE 1.27 (H) 03/20/2021   BILITOT 0.5 03/20/2021   ALKPHOS 27 (L) 03/20/2021   AST 20 03/20/2021   ALT 26 03/20/2021   PROT 6.9 03/20/2021   ALBUMIN 4.4 03/20/2021   CALCIUM 10.0 03/20/2021   GFR 51.35 (L) 03/20/2021   Lab Results  Component Value Date   CHOL 182  03/20/2021   Lab Results  Component Value Date   HDL 64.20 03/20/2021   Lab Results  Component Value Date   LDLCALC 102 (H) 03/20/2021   Lab Results  Component Value Date   TRIG 82.0 03/20/2021   Lab Results  Component Value Date   CHOLHDL 3 03/20/2021   No results found for: HGBA1C     Assessment & Plan:   Problem List Items Addressed This Visit   None Visit Diagnoses     Insomnia, unspecified type    -  Primary   Relevant Medications   traZODone (DESYREL) 50 MG tablet   Other Relevant Orders   Ambulatory referral to Neurology       Meds ordered this encounter  Medications   traZODone (DESYREL) 50 MG tablet    Sig: Take 0.5-1 tablets (25-50 mg total) by mouth at bedtime as needed for sleep.    Dispense:  30 tablet    Refill:  3    I discussed the assessment and treatment plan with the patient. The patient was provided an opportunity to ask questions and all were answered. The patient agreed with the plan and demonstrated an understanding of the instructions.   The patient was advised to call back or seek an in-person evaluation if the symptoms worsen or if the condition fails to improve as anticipated.  I provided 20 minutes of face-to-face time during this encounter.   I,Amber Collins,acting as a Education administrator for [employer], DO.,have documented all relevant documentation on the behalf of Ann Held, DO,as directed by  Rosalita Chessman  Chase, DO while in the presence of Ann Held, DO.   Ann Held, DO [place] [facility] [employer] [facility] [phone] (phone) [phone] (fax)  [place]

## 2021-08-04 DIAGNOSIS — M9903 Segmental and somatic dysfunction of lumbar region: Secondary | ICD-10-CM | POA: Diagnosis not present

## 2021-08-04 DIAGNOSIS — M9902 Segmental and somatic dysfunction of thoracic region: Secondary | ICD-10-CM | POA: Diagnosis not present

## 2021-08-04 DIAGNOSIS — M5414 Radiculopathy, thoracic region: Secondary | ICD-10-CM | POA: Diagnosis not present

## 2021-08-04 DIAGNOSIS — M9905 Segmental and somatic dysfunction of pelvic region: Secondary | ICD-10-CM | POA: Diagnosis not present

## 2021-08-06 ENCOUNTER — Telehealth: Payer: Self-pay | Admitting: Obstetrics & Gynecology

## 2021-08-06 NOTE — Telephone Encounter (Signed)
TC to pt. No answer. Sent MyChart message.   [name] [name], M.D., Cherlynn June

## 2021-08-11 ENCOUNTER — Other Ambulatory Visit: Payer: Self-pay | Admitting: Family Medicine

## 2021-08-11 DIAGNOSIS — G4700 Insomnia, unspecified: Secondary | ICD-10-CM

## 2021-08-20 ENCOUNTER — Ambulatory Visit (INDEPENDENT_AMBULATORY_CARE_PROVIDER_SITE_OTHER): Payer: BC Managed Care – PPO | Admitting: Neurology

## 2021-08-20 ENCOUNTER — Encounter: Payer: Self-pay | Admitting: Neurology

## 2021-08-20 VITALS — BP 158/90 | HR 66 | Ht 66.0 in | Wt 139.5 lb

## 2021-08-20 DIAGNOSIS — R5383 Other fatigue: Secondary | ICD-10-CM | POA: Insufficient documentation

## 2021-08-20 DIAGNOSIS — G4701 Insomnia due to medical condition: Secondary | ICD-10-CM

## 2021-08-20 DIAGNOSIS — G478 Other sleep disorders: Secondary | ICD-10-CM

## 2021-08-20 HISTORY — DX: Other sleep disorders: G47.8

## 2021-08-20 HISTORY — DX: Other fatigue: R53.83

## 2021-08-20 NOTE — Patient Instructions (Signed)
Quality Sleep Information, Adult Quality sleep is important for your mental and physical health. It also improves your quality of life. Quality sleep means you: Are asleep for most of the time you are in bed. Fall asleep within 30 minutes. Wake up no more than once a night.  Are awake for no longer than 20 minutes if you do wake up during the night. Most adults need 7-8 hours of quality sleep each night. How can poor sleep affect me? If you do not get enough quality sleep, you may have: Mood swings. Daytime sleepiness. Confusion. Decreased reaction time. Sleep disorders, such as insomnia and sleep apnea. Difficulty with: Solving problems. Coping with stress. Paying attention. These issues may affect your performance and productivity at work, school, and at home. Lack of sleep may also put you at higher risk for accidents, suicide, and risky behaviors. If you do not get quality sleep you may also be at higher risk for several health problems, including: Infections. Type 2 diabetes. Heart disease. High blood pressure. Obesity. Worsening of long-term conditions, like arthritis, kidney disease, depression, Parkinson's disease, and epilepsy. What actions can I take to get more quality sleep?     Stick to a sleep schedule. Go to sleep and wake up at about the same time each day. Do not try to sleep less on weekdays and make up for lost sleep on weekends. This does not work. Try to get about 30 minutes of exercise on most days. Do not exercise 2-3 hours before going to bed. Limit naps during the day to 30 minutes or less. Do not use any products that contain nicotine or tobacco, such as cigarettes or e-cigarettes. If you need help quitting, ask your health care provider. Do not drink caffeinated beverages for at least 8 hours before going to bed. Coffee, tea, and some sodas contain caffeine. Do not drink alcohol close to bedtime. Do not eat large meals close to bedtime. Do not take naps  in the late afternoon. Try to get at least 30 minutes of sunlight every day. Morning sunlight is best. Make time to relax before bed. Reading, listening to music, or taking a hot bath promotes quality sleep. Make your bedroom a place that promotes quality sleep. Keep your bedroom dark, quiet, and at a comfortable room temperature. Make sure your bed is comfortable. Take out sleep distractions like TV, a computer, smartphone, and bright lights. If you are lying awake in bed for longer than 20 minutes, get up and do a relaxing activity until you feel sleepy. Work with your health care provider to treat medical conditions that may affect sleeping, such as: Nasal obstruction. Snoring. Sleep apnea and other sleep disorders. Talk to your health care provider if you think any of your prescription medicines may cause you to have difficulty falling or staying asleep. If you have sleep problems, talk with a sleep consultant. If you think you have a sleep disorder, talk with your health care provider about getting evaluated by a specialist. Where to find more information [place] website: https://sleepfoundation.org National Heart, Lung, and [place]): http://www.saunders.info/.pdf Centers for Disease Control and Prevention (CDC): LearningDermatology.pl Contact a health care provider if you: Have trouble getting to sleep or staying asleep. Often wake up very early in the morning and cannot get back to sleep. Have daytime sleepiness. Have daytime sleep attacks of suddenly falling asleep and sudden muscle weakness (narcolepsy). Have a tingling sensation in your legs with a strong urge to move your  legs (restless legs syndrome). Stop breathing briefly during sleep (sleep apnea). Think you have a sleep disorder or are taking a medicine that is affecting your quality of sleep. Summary Most adults need 7-8 hours of quality sleep each  night. Getting enough quality sleep is an important part of health and well-being. Make your bedroom a place that promotes quality sleep and avoid things that may cause you to have poor sleep, such as alcohol, caffeine, smoking, and large meals. Talk to your health care provider if you have trouble falling asleep or staying asleep. This information is not intended to replace advice given to you by your health care provider. Make sure you discuss any questions you have with your health care provider. Document Revised: 03/10/2021 Document Reviewed: 12/02/2020 Elsevier Patient Education  [place].

## 2021-08-20 NOTE — Progress Notes (Signed)
SLEEP MEDICINE CLINIC    Provider:  Larey Seat, MD  Primary Care Physician:  Ann Held, DO [address] [place] [address]     Referring Provider: Carollee Herter, [address] [place],  [place] [address]          Chief Complaint according to patient   Patient presents with:     New Patient (Initial Visit)           HISTORY OF PRESENT ILLNESS:  [name] [name] is a 46 y.o.  female patient and seen on 08/20/2021  for a Sleep Consultation.  Chief concern according to patient :  " I often cannot go back to sleep after restroom break, usually after 4 hours of sleep"  "Feeling too cold, or too hot at times , but this improved on HRT- and I haven't had a period since I started. "  I used to be able to sleep again 9 hours and longer before parenthood, deep sleep ended. [name] [name] is now 61.     DAWSON HOLLMAN   has a past medical history of Allergy, Basal cell carcinoma, Celiac disease (2009), Functional dyspepsia (09/05/2012), Gastritis, GERD (gastroesophageal reflux disease) (08/08/2012), Central line catheter jugularis,  Guillain Barr syndrome (04/01/2007), Hyperthyroidism (04/01/2007), and heavy menstrual bleeding, Hypoglycemia, unspecified (10/26/2007).     Sleep relevant medical history: Nocturia once, no recent hot flushes.  allergic congestion of nose, mouth breather .Marland Kitchen Pt referred for sleep consult. Pt having insomnia, non-restorative sleep, waking in the night, and snoring. Onset of pre-menapuase, currently taking hormones to help w quality of sleep. Sleeps good for the first 3-4 hrs and usually gets up to use the restroom and is unable to sleep well the rest of the night. was taking 76m of melatonin nightly. Has cut back on caffeine and melatonin.  Drowsiness mainly after lunch. Trazadone made her sleepy but didn't help her get back to sleep. Currently trying CBD and has worked better then trazodone. Has  noticed some memory concerns. No significant morning HA. No prior SS.   Family medical /sleep history: no other family member on CPAP with OSA, insomnia, sleep walkers.    Social history:  Patient is working as RElectronics engineerfor a mCounselling psychologist   and lives in a household with spouse and 2 children, both teenagers . One dog indoors, 2 dogs outdoor.  The patient currently works from home.  She can sleep later, but she still wakes up earlier than desired. Considers herself a night owl.   Tobacco use; quit after college, 2 years only.   ETOH use : affecting sleep, she has cut back on ETOH, due to sleep disturbances. Caffeine intake in form of Coffee( 1 cup before lunch) Soda( /) Tea ( 1 cup hot tea in AM ) or energy drinks. Regular exercise in form of walking, body pump, cycling,  cardiovascular- running.     Sleep habits are as follows: The patient's dinner time is between 7-8 PM. The patient goes to bed at 9-10 PM and continues to sleep for 3-4  hours, she is not a snorer, but her husband does snore. He watches TV in the bedroom- TV turned off after 15 minutes  The bedroom is otherwise quiet , cool and dark. She sometimes goes to sleep in the guestroom. The preferred sleep position is right sided - with the support of 2-4 pillows. 2 for body,  2 for head.  Dreams are reportedly frequent.  6  AM is the usual rise time. The patient wakes up spontaneously around 3 and may doze off for another 1-2 hours. .   She reports not feeling refreshed or restored in AM, with symptoms such as dry mouth, rare morning headaches, and residual fatigue. Naps are taken infrequently, lasting from 30 to 40 minutes and are more refreshing than nocturnal sleep.    Review of Systems: Out of a complete 14 system review, the patient complains of only the following symptoms, and all other reviewed systems are negative.:  Fatigue, sleepiness , snoring, fragmented sleep, Insomnia    How likely are you to doze  in the following situations: 0 = not likely, 1 = slight chance, 2 = moderate chance, 3 = high chance   Sitting and Reading? Watching Television? Sitting inactive in a public place (theater or meeting)? As a passenger in a car for an hour without a break? Lying down in the afternoon when circumstances permit? Sitting and talking to someone? Sitting quietly after lunch without alcohol? In a car, while stopped for a few minutes in traffic?   Total = 6/ 24 points   FSS endorsed at 31/ 63 points.   Social History   Socioeconomic History   Marital status: Married    Spouse name: [name]   Number of children: 2   Years of education: 16   Highest education level: Bachelor's degree (e.g., BA, AB, BS)  Occupational History   Occupation: Pension scheme manager: [employer]  Tobacco Use   Smoking status: Former    Types: Cigarettes    Quit date: 01/19/1998    Years since quitting: 23.6   Smokeless tobacco: Never   Tobacco comments:    About age 37   Vaping Use   Vaping Use: Never used  Substance and Sexual Activity   Alcohol use: Yes    Alcohol/week: 3.0 - 4.0 standard drinks of alcohol    Types: 3 - 4 Glasses of wine per week    Comment: few glasses of wine on weekends   Drug use: Yes    Comment: CBD   Sexual activity: Yes    Partners: Male  Other Topics Concern   Not on file  Social History Narrative   Married with 2 children   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 [place]   Occasional alcohol no drugs or tobacco use at this time   Exercise-- 2 x a week    R handed   Caffeine: a C of tea and Coffee a day   Social Determinants of Radio broadcast assistant Strain: Not on file  Food Insecurity: Not on file  Transportation Needs: Not on file  Physical Activity: Not on file  Stress: Not on file  Social Connections: Not on file    Family History  Problem Relation Age of Onset   Leukemia Mother 31   Hypertension Mother    Stroke Mother     Memory loss Mother    Hypertension Father    Hyperlipidemia Father    Diabetes Father    Prostate cancer Father 9   Depression Father    Hypertension Sister    Bipolar disorder Sister    Stroke Maternal Grandmother    Multiple sclerosis Cousin    Lupus Cousin    Hypertension Other        siblings   Autoimmune disease Other  numerous family members on her mother's side   Colon cancer Neg Hx    Esophageal cancer Neg Hx    Stomach cancer Neg Hx    Rectal cancer Neg Hx     Past Medical History:  Diagnosis Date   Allergy    Basal cell carcinoma    abdomen   Celiac disease 2009   Functional dyspepsia 09/05/2012   Gastritis    GERD (gastroesophageal reflux disease) 08/08/2012   Guillain Barr syndrome 04/01/2007   After influenza vaccine   Hyperthyroidism 04/01/2007   Hypoglycemia, unspecified 10/26/2007    Past Surgical History:  Procedure Laterality Date   CESAREAN SECTION     COLONOSCOPY     ESOPHAGOGASTRODUODENOSCOPY     SKIN CANCER EXCISION     abdomin   WISDOM TOOTH EXTRACTION       Current Outpatient Medications on File Prior to Visit  Medication Sig Dispense Refill   clindamycin (CLEOCIN T) 1 % SWAB Apply 1 each topically 2 (two) times daily.     Ferrous Sulfate (IRON) 325 (65 FE) MG TABS Take 65 mg by mouth daily.     glucosamine-chondroitin 500-400 MG tablet Take 1 tablet by mouth 3 (three) times daily.     melatonin 5 MG TABS Take 5 mg by mouth.     Multiple Vitamin (MULTIVITAMIN) tablet Take 1 tablet by mouth daily.     NON FORMULARY at bedtime. CBD     Norethindrone Acetate-Ethinyl Estradiol (LOESTRIN 1.5/30, 21,) 1.5-30 MG-MCG tablet Take 1 tablet by mouth daily. 84 tablet 4   Probiotic Product (PROBIOTIC ADVANCED PO) Take by mouth daily.     tretinoin (RETIN-A) 0.05 % cream Apply topically at bedtime. 45 g 0   No current facility-administered medications on file prior to visit.    Allergies  Allergen Reactions   Influenza Vac Split Quad      REACTION: pt had Joellyn Quails--- can not have flu shot    Physical exam:  Today's Vitals   08/20/21 1001  BP: (!) 158/90  Pulse: 66  Weight: 139 lb 8 oz (63.3 kg)  Height: 5' 6"  (1.676 m)   Body mass index is 22.52 kg/m.   Wt Readings from Last 3 Encounters:  08/20/21 139 lb 8 oz (63.3 kg)  03/26/21 144 lb 9.6 oz (65.6 kg)  03/20/21 147 lb 9.6 oz (67 kg)     Ht Readings from Last 3 Encounters:  08/20/21 5' 6"  (1.676 m)  03/26/21 5' 6"  (1.676 m)  03/20/21 5' 6"  (1.676 m)      General: The patient is awake, alert and appears not in acute distress. The patient is well groomed. Head: Normocephalic, atraumatic. Neck is supple. Mallampati  ,  neck circumference:13 inches . Nasal airflow barely patent.  Retrognathia is  seen.  Dental status:  Cardiovascular:  Regular rate and cardiac rhythm by pulse,  without distended neck veins. Respiratory: Lungs are clear to auscultation.  Skin:  Without evidence of ankle edema, or rash. Trunk: The patient's posture is erect.   Neurologic exam : The patient is awake and alert, oriented to place and time.   Memory subjective described as intact.  Attention span & concentration ability appears normal.  Speech is fluent,  without  dysarthria, dysphonia or aphasia.  Mood and affect are appropriate.   Cranial nerves: no loss of smell or taste reported  Pupils are equal and briskly reactive to light. Funduscopic exam deferred..  Extraocular movements in vertical and horizontal planes were intact  and without nystagmus. No Diplopia. Visual fields by finger perimetry are intact. Hearing was intact to finger rubbing.  Tuning fork testing revealed better hearing on the right.    Facial sensation intact to fine touch.  Facial motor strength is symmetric and tongue and uvula move midline.  Neck ROM : rotation, tilt and flexion extension were normal for age and shoulder shrug was symmetrical.    Motor exam:  Symmetric bulk, tone and ROM.    Normal tone without cog wheeling, symmetric grip strength .   Sensory:  Fine touch, pinprick and vibration were tested  and  normal.  Proprioception tested in the upper extremities was normal.   Coordination: Rapid alternating movements in the fingers/hands were of normal speed.  The Finger-to-nose maneuver was intact without evidence of ataxia, dysmetria or tremor.   Gait and station: Patient could rise unassisted from a seated position, walked without assistive device.  Stance is of normal width/ base and the patient turned with 3 steps.  Toe and heel walk were deferred.  Deep tendon reflexes: in the  upper and lower extremities are symmetric and intact.  Babinski response was deferred.      After spending a total time of  35  minutes face to face and additional time for physical and neurologic examination, review of laboratory studies,  personal review of imaging studies, reports and results of other testing and review of referral information / records as far as provided in visit, I have established the following assessments:    1) at the pleasure of meeting today today with [name] [name], a 46 year old woman who first noticed changes to her sleep restorative qualities about 18 years ago during pregnancy and then continued for a couple of years after childbirth. She then recovered to a good and restorative and refreshing sleep quality had no problem going back to sleep after a single bathroom visit but this changed with the onset of perimenopausal hormone changes.  Hormone replacement therapy has helped to limit the impact of hot flashes or temperature sensitivity, yet the patient does feel that she is in need of a sleep aid following her single bathroom break most nights.  This takes place between 1 and 3 AM and often leads to the rest of the night being fragmented, broken sleep -sometimes some dozing off will happen after an hour or 2 of wakefulness.   2) she only is able to  resume sleeping after taking sleep aids and she has tried over-the-counter medications and supplements including melatonin she was prescribed trazodone but by trazodone helps her to go to sleep even easier it is not working well to support and sustain her sleep after the bathroom break. There has been a subjective concern about memory and cognitive function and for this reason I would be hesitant to use tricyclic's which could be an effective sleep aid for patient of her age.  Trazodone also is very dose-dependent and for some patients 100 mg will be just enough for others 25 already leading to some hangover effects.  It may be worth playing with the dose a little bit.  3) I am not opposed to him supplements or cannabinol as a sleep aid and the key will be to find a reliable dose made by the same manufacture.  If possible, she could go to a hemp store or a "hemp pharmacy".     My Plan is to proceed with:  1) No TV in the bedroom, stay on HRT, and evaluate  different doses of trazodone.  2) HST to rule out organic causes. 3) celiac disease may cause inflammation that in turn does cause fatigue.   I would like to thank Ann Held, Do [address] [place],  [place] [address] for allowing me to meet with and to take care of this pleasant patient.    I plan to follow up either personally or through our NP within 2-3 months.    Electronically signed by: Larey Seat, MD 08/20/2021 10:11 AM  [facility] Board certified by The [employer] of Sleep Medicine and Diplomate of the [employer] of Sleep Medicine. Board certified In Neurology through the [place], Fellow of the [employer] of Neurology. Medical Director of [facility].

## 2021-09-01 DIAGNOSIS — M9903 Segmental and somatic dysfunction of lumbar region: Secondary | ICD-10-CM | POA: Diagnosis not present

## 2021-09-01 DIAGNOSIS — M9902 Segmental and somatic dysfunction of thoracic region: Secondary | ICD-10-CM | POA: Diagnosis not present

## 2021-09-01 DIAGNOSIS — M9905 Segmental and somatic dysfunction of pelvic region: Secondary | ICD-10-CM | POA: Diagnosis not present

## 2021-09-01 DIAGNOSIS — M5414 Radiculopathy, thoracic region: Secondary | ICD-10-CM | POA: Diagnosis not present

## 2021-09-22 ENCOUNTER — Telehealth: Payer: Self-pay | Admitting: Neurology

## 2021-09-22 NOTE — Telephone Encounter (Signed)
HST- BCBS no auth req spoke to [place] ref # [id number].   Patient is scheduled at [facility] for 09/29/21 at 7:30 AM.  Sent patient a mychart message about her appoint.

## 2021-09-29 ENCOUNTER — Ambulatory Visit: Payer: BC Managed Care – PPO | Admitting: Neurology

## 2021-09-29 DIAGNOSIS — G4710 Hypersomnia, unspecified: Secondary | ICD-10-CM | POA: Diagnosis not present

## 2021-09-29 DIAGNOSIS — R5383 Other fatigue: Secondary | ICD-10-CM

## 2021-09-29 DIAGNOSIS — G478 Other sleep disorders: Secondary | ICD-10-CM

## 2021-09-29 DIAGNOSIS — G4701 Insomnia due to medical condition: Secondary | ICD-10-CM

## 2021-09-29 DIAGNOSIS — G4700 Insomnia, unspecified: Secondary | ICD-10-CM

## 2021-10-01 ENCOUNTER — Telehealth: Payer: Self-pay | Admitting: Internal Medicine

## 2021-10-01 NOTE — Telephone Encounter (Signed)
Good evening Dr Carlean Purl  Inbound call from patient inquiring about colon procedure. Patient does not have a recall. Last colon procedure 10 years ago. Patient also had a upper EGD in 2014. Patient last OV was in 2019. Please review and advise if the patient needs to be seen for an ov prior to scheduling procedure.  Thank you

## 2021-10-01 NOTE — Progress Notes (Signed)
[facility] [place] [place] TEST REPORT ( by Watch PAT)   STUDY DATE:  10-01-2021 DOB:  1975-09-12 MRN:    ORDERING CLINICIAN: Larey Seat, MD  REFERRING CLINICIAN: Dr Etter Sjogren   CLINICAL INFORMATION/HISTORY:  seen on 08/20/2021  for a Sleep Consultation.  Chief concern according to patient :  " I often cannot go back to sleep after a restroom break, usually after 4 hours of sleep" and :"Feeling too cold, or too hot at times , but this improved on HRT- and I haven't had a period since I started. "   I used to be able to sleep again 9 hours and longer before parenthood,  [name] [name]   has a past medical history of Allergy, Basal cell carcinoma, Celiac disease (2009), Functional dyspepsia (09/05/2012), Gastritis, GERD (gastroesophageal reflux disease) (08/08/2012), Central line catheter jugularis,  Guillain Barr syndrome (04/01/2007), Hyperthyroidism (04/01/2007), and heavy menstrual bleeding, Hypoglycemia, unspecified (10/26/2007).     Epworth sleepiness score: 6/24.   BMI:22.3  kg/m   Neck Circumference: 13"   FINDINGS:   Sleep Summary:   Total Recording Time (hours, min):   7 hours and 53 minutes of which the total sleep time amounted to 6 hours 47 minutes.  REM sleep was seen and 25.6% of total sleep time.                              Respiratory Indices:   Calculated pAHI (per hour):   1.4/h                          REM pAHI:     2.9/h                                            NREM pAHI:     0.8/h                         Positional AHI:    The longest sleep time was seen in the left lateral sleep position, associated with an AHI of 1.4/h.  This was followed by supine sleep with an AHI of 2.6/h and right-sided sleep with an AHI of 0/h.  Snoring level reached a mean volume of 40 dB which is the threshold of the device microphone.  Audible breathing may have been present for 10% of total sleep time                                                Oxygen Saturation Statistics:   Oxygen Saturation (%) Mean:       Between a nadir of 91% and a maximum of 99%, the mean oxygen saturation was 96%.        O2 Saturation (minutes) <89%: 0 minutes         Pulse Rate Statistics:             Pulse Range:    Between 45 and 114 bpm with a mean heart rate of 67 bpm             IMPRESSION:  This HST indicates that no sleep apnea of clinical significance is present, neither is there a sign of hypoxia, unusual heart rate variability, positional dependent sleep disordered breathing.   RECOMMENDATION:I noticed the fragmentation of sleep was 11 waking periods- and the very short sleep latency of only 5 minutes was also noted. In order to further differentiate these arousals I would need to invite the patient for an attended sleep study.    INTERPRETING PHYSICIAN:   Larey Seat, MD   Medical Director of [facility] at [facility].

## 2021-10-01 NOTE — Telephone Encounter (Signed)
She may be scheduled for screening colonoscopy w/o office visit

## 2021-10-02 ENCOUNTER — Encounter: Payer: Self-pay | Admitting: Internal Medicine

## 2021-10-02 NOTE — Telephone Encounter (Signed)
Called patient and left voicemail to call back and schedule procedure.

## 2021-10-03 DIAGNOSIS — G4701 Insomnia due to medical condition: Secondary | ICD-10-CM | POA: Insufficient documentation

## 2021-10-03 HISTORY — DX: Insomnia due to medical condition: G47.01

## 2021-10-03 NOTE — Progress Notes (Signed)
IMPRESSION:  This HST indicates that no sleep apnea of clinical significance is present, neither is there a sign of hypoxia, unusual heart rate variability, positional dependent sleep disordered breathing.   RECOMMENDATION:I noticed the fragmentation of sleep was 11 waking periods- and the very short sleep latency of only 5 minutes was also noted. In order to further differentiate these arousals, I would need to invite the patient for an attended sleep study.

## 2021-10-03 NOTE — Addendum Note (Signed)
Addended by: Larey Seat on: 10/03/2021 02:55 PM   Modules accepted: Orders

## 2021-10-03 NOTE — Procedures (Signed)
[facility] TEST REPORT ( by Watch PAT)   STUDY DATE:  10-01-2021 DOB:  10-27-1975 MRN:    ORDERING CLINICIAN: Larey Seat, MD  REFERRING CLINICIAN: Dr Etter Sjogren   CLINICAL INFORMATION/HISTORY:  seen on 08/20/2021  for a Sleep Consultation.  Chief concern according to patient :  " I often cannot go back to sleep after a restroom break, usually after 4 hours of sleep" and :"Feeling too cold, or too hot at times , but this improved on HRT- and I haven't had a period since I started. "   I used to be able to sleep again 9 hours and longer before parenthood,  [name]   has a past medical history of Allergy, Basal cell carcinoma, Celiac disease (2009), Functional dyspepsia (09/05/2012), Gastritis, GERD (gastroesophageal reflux disease) (08/08/2012), Central line catheter jugularis,  Guillain Barr syndrome (04/01/2007), Hyperthyroidism (04/01/2007), and heavy menstrual bleeding, Hypoglycemia, unspecified (10/26/2007).     Epworth sleepiness score: 6/24.   BMI:22.3  kg/m   Neck Circumference: 13"   FINDINGS:   Sleep Summary:   Total Recording Time (hours, min):   7 hours and 53 minutes of which the total sleep time amounted to 6 hours 47 minutes.  REM sleep was seen and 25.6% of total sleep time.                              Respiratory Indices:   Calculated pAHI (per hour):   1.4/h                          REM pAHI:     2.9/h                                            NREM pAHI:     0.8/h                         Positional AHI:    The longest sleep time was seen in the left lateral sleep position, associated with an AHI of 1.4/h.  This was followed by supine sleep with an AHI of 2.6/h and right-sided sleep with an AHI of 0/h.  Snoring level reached a mean volume of 40 dB which is the threshold of the device microphone.  Audible breathing may have been present for 10% of total sleep time                                                Oxygen Saturation Statistics:   Oxygen Saturation (%) Mean:       Between a nadir of 91% and a maximum of 99%, the mean oxygen saturation was 96%.        O2 Saturation (minutes) <89%: 0 minutes         Pulse Rate Statistics:             Pulse Range:    Between 45 and 114 bpm with a mean heart rate of 67 bpm             IMPRESSION:  This HST indicates that no sleep apnea of clinical significance is present, neither is there a sign of hypoxia, unusual heart rate variability, positional dependent sleep disordered breathing.   RECOMMENDATION:I noticed the fragmentation of sleep was 11 waking periods- and the very short sleep latency of only 5 minutes was also noted. In order to further differentiate these arousals I would need to invite the patient for an attended sleep study.    INTERPRETING PHYSICIAN:   Larey Seat, MD   Medical Director of [facility] at [facility].

## 2021-10-06 ENCOUNTER — Telehealth: Payer: Self-pay

## 2021-10-06 NOTE — Telephone Encounter (Signed)
-----   Message from Larey Seat, MD sent at 10/03/2021  2:55 PM EDT ----- IMPRESSION:  This HST indicates that no sleep apnea of clinical significance is present, neither is there a sign of hypoxia, unusual heart rate variability, positional dependent sleep disordered breathing.   RECOMMENDATION:I noticed the fragmentation of sleep was 11 waking periods- and the very short sleep latency of only 5 minutes was also noted. In order to further differentiate these arousals, I would need to invite the patient for an attended sleep study.

## 2021-10-06 NOTE — Telephone Encounter (Signed)
I called patient. I discussed her sleep study results and recommendations. She is agreeable to trying for an attended sleep study if insurance covers it. She will wait for a call from our sleep lab to discuss further. She asked that the 10/22/21 appointment be pushed out a little further. A new appointment was made for 12/09/21 at 7:45am with Janett Billow, NP. Pt verbalized understanding of results. Pt had no questions at this time but was encouraged to call back if questions arise.

## 2021-10-07 DIAGNOSIS — M9902 Segmental and somatic dysfunction of thoracic region: Secondary | ICD-10-CM | POA: Diagnosis not present

## 2021-10-07 DIAGNOSIS — M9903 Segmental and somatic dysfunction of lumbar region: Secondary | ICD-10-CM | POA: Diagnosis not present

## 2021-10-07 DIAGNOSIS — M9905 Segmental and somatic dysfunction of pelvic region: Secondary | ICD-10-CM | POA: Diagnosis not present

## 2021-10-07 DIAGNOSIS — M7711 Lateral epicondylitis, right elbow: Secondary | ICD-10-CM | POA: Diagnosis not present

## 2021-10-08 DIAGNOSIS — L814 Other melanin hyperpigmentation: Secondary | ICD-10-CM | POA: Diagnosis not present

## 2021-10-08 DIAGNOSIS — D235 Other benign neoplasm of skin of trunk: Secondary | ICD-10-CM | POA: Diagnosis not present

## 2021-10-08 DIAGNOSIS — D225 Melanocytic nevi of trunk: Secondary | ICD-10-CM | POA: Diagnosis not present

## 2021-10-08 DIAGNOSIS — L700 Acne vulgaris: Secondary | ICD-10-CM | POA: Diagnosis not present

## 2021-10-09 ENCOUNTER — Telehealth: Payer: Self-pay | Admitting: Neurology

## 2021-10-09 NOTE — Telephone Encounter (Signed)
[employer]- [employer] no auth req ref # [name] [name] on 10/07/21.  Patient is scheduled at [facility] for 10/31/21 at 9 pm.  Mailed packet and sent [employer].

## 2021-10-22 ENCOUNTER — Ambulatory Visit: Payer: BC Managed Care – PPO | Admitting: Adult Health

## 2021-10-31 ENCOUNTER — Ambulatory Visit (INDEPENDENT_AMBULATORY_CARE_PROVIDER_SITE_OTHER): Payer: BC Managed Care – PPO | Admitting: Neurology

## 2021-10-31 DIAGNOSIS — G478 Other sleep disorders: Secondary | ICD-10-CM | POA: Diagnosis not present

## 2021-10-31 DIAGNOSIS — R5383 Other fatigue: Secondary | ICD-10-CM

## 2021-10-31 DIAGNOSIS — G4700 Insomnia, unspecified: Secondary | ICD-10-CM

## 2021-10-31 DIAGNOSIS — G4701 Insomnia due to medical condition: Secondary | ICD-10-CM

## 2021-11-04 DIAGNOSIS — M9903 Segmental and somatic dysfunction of lumbar region: Secondary | ICD-10-CM | POA: Diagnosis not present

## 2021-11-04 DIAGNOSIS — M7711 Lateral epicondylitis, right elbow: Secondary | ICD-10-CM | POA: Diagnosis not present

## 2021-11-04 DIAGNOSIS — M9902 Segmental and somatic dysfunction of thoracic region: Secondary | ICD-10-CM | POA: Diagnosis not present

## 2021-11-04 DIAGNOSIS — M9907 Segmental and somatic dysfunction of upper extremity: Secondary | ICD-10-CM | POA: Diagnosis not present

## 2021-11-04 DIAGNOSIS — M9905 Segmental and somatic dysfunction of pelvic region: Secondary | ICD-10-CM | POA: Diagnosis not present

## 2021-11-05 ENCOUNTER — Ambulatory Visit (AMBULATORY_SURGERY_CENTER): Payer: BC Managed Care – PPO | Admitting: *Deleted

## 2021-11-05 VITALS — Ht 66.0 in | Wt 137.0 lb

## 2021-11-05 DIAGNOSIS — Z1211 Encounter for screening for malignant neoplasm of colon: Secondary | ICD-10-CM

## 2021-11-05 NOTE — Progress Notes (Signed)
Patient is here in-person for PV. Patient denies any allergies to eggs or soy. Patient denies any problems with anesthesia/sedation. Patient is not on any oxygen at home. Patient is not taking any diet/weight loss medications or blood thinners. Went over procedure prep instructions with the patient. Patient is aware of our care-partner policy. Patient denies constipation.

## 2021-11-11 DIAGNOSIS — G4700 Insomnia, unspecified: Secondary | ICD-10-CM

## 2021-11-11 HISTORY — DX: Insomnia, unspecified: G47.00

## 2021-11-11 NOTE — Progress Notes (Signed)
Frequent micro arousals but overall very good sleep efficiency. The cause for these seconds of sleep interruption remains unknown.  In total , there was sufficient sleep time and good sleep architecture noted.   PS: I wonder if Ms. Zemanek could benefit from low dose progesterone at night, or changing to a Combi -Patch HRT. There is anecdotal evidence of sleep improvement under progesterone.

## 2021-11-11 NOTE — Procedures (Signed)
[facility] POLYSOMNOGRAPHY  INTERPRETATION REPORT   STUDY DATE:  10/31/2021     PATIENT NAME:  Wayna Chalet. "Scottie" Katherene Ponto         DATE OF BIRTH:  November 05, 1975  PATIENT ID:  [id number]    TYPE OF STUDY:  PSG  READING PHYSICIAN: Larey Seat, MD REFERRED BY: Roma Schanz, DO SCORING TECHNICIAN: Gaylyn Cheers, RPSGT   HISTORY:  KIKI BIVENS is a 46 y.o. female patient who was seen in Consultation on 08/20/21. Chief concern:  " I often cannot go back to sleep after restroom break, usually after 4 hours of sleep" and: "Feeling too cold, or too hot at times at night, but this improved on HRT- and I haven't had a period since I started. "   "I used to be able to sleep for 9 hours and longer before parenthood", deep sleep ended with birth of her eldest child, now 94. ALEKSANDRA RABEN has a past medical history of Allergies, Basal cell carcinoma, Celiac disease (2009), Functional dyspepsia (09/05/2012), Gastritis, GERD (gastroesophageal reflux disease) (08/08/2012), Central line catheter jugularis, Guillain Barr syndrome (04/01/2007), Hyperthyroidism (04/01/2007), and heavy menstrual bleeding, Hypoglycemia, unspecified (10/26/2007).  Sleep relevant medical history: Nocturia once, no recent hot flushes. Has had problems with allergic congestion of nose, mouth breather. insomnia, non-restorative sleep, waking in the night, and snoring. Onset of pre- menopause with decline in quality of sleep. Sleeps good and deep for the first 3-4 hours and usually gets up to use the restroom , then finds herself unable to sleep through the rest of the night. She was taking 43m of melatonin nightly but  cut back on caffeine and melatonin. Drowsiness reported mainly after lunch. Trazodone made her sleepy but didn't help her go back to sleep. Currently on CBD which has worked better then Trazodone ADDITIONAL INFORMATION:  The Epworth Sleepiness Scale endorsed at at  6 /24  points (scores above or equal to 10 are suggestive of hypersomnolence). FSS endorsed at  31 /63 points.  Height: 66 in Weight: 139 lb (BMI 22) Neck Size: 13 in  MEDICATIONS: Iron, Glucosamine-Chondroitin, Melatonin, Multivitamins, Loestrin HRT, Probiotic Advanced. TECHNICAL DESCRIPTION: A registered sleep technologist ( RPSGT)  was in attendance for the duration of the recording.  Data collection, scoring, video monitoring, and reporting were performed in compliance with the AASM Manual for the Scoring of Sleep and Associated Events; (Hypopnea is scored based on the criteria listed in Section VIII D. 1b in the AASM Manual V2.6 using a 4% oxygen desaturation rule or Hypopnea is scored based on the criteria listed in Section VIII D. 1a in the AASM Manual V2.6 using 3% oxygen desaturation and /or arousal rule).    SLEEP CONTINUITY AND SLEEP ARCHITECTURE:  Lights-out was at 22:00: and lights-on at  04:50:, with  6 hours 49 minutes of total recording time ( 409 minutes) .  Total sleep time ( TST) was 371.5 minutes with a normal sleep efficiency at 90.7%. Sleep latency was brief at 8.0 minutes. Wake after sleep onset (WASO) time accounted for 29 minutes.   REM sleep latency was normal at 69.0 minutes. Of the total sleep time, the percentage of stage N1 sleep was 1.6%, stage N2 sleep was 70%, stage N3 sleep was 7.5%, and REM sleep was 20.7%. BODY POSITION:  TST was divided between the following sleep positions: supine 181 minutes (49%), non-supine 190 minutes (51%); right 144 minutes (39%), left 46 minutes (12%), and prone 00 minutes (0%).  Total  supine REM sleep time was 74 minutes (96% of total REM sleep). There were 5 Stage R periods observed on this study night, 31 awakenings (i.e. transitions to Stage W from any sleep stage), and 93 total stage transitions.   RESPIRATORY MONITORING:  Based on AASM criteria (using a 3% oxygen desaturation and /or arousal rule for scoring hypopneas), there were 0 apneas (0  obstructive; 0 central; 0 mixed), and 0 hypopneas.  The AHI (apnea-hypopnea index) was 0.0/h overall  OXIMETRY: Oxyhemoglobin Saturation Nadir during sleep was at  93%,  from a mean 02 saturation of 97%.  Of the Total sleep time (TST), hypoxemia (<90 % 02 sat) was present for only 0.2 minutes, or 0.0% of total sleep time.  LIMB MOVEMENTS: There were 0 periodic limb movements of sleep (0.0/hr). AROUSALS: There were 104 arousals in total, for an arousal index of 14 /hour. There were 0 respiratory effort-related arousals (RERAs).  Of these, 0 were identified as respiratory-related arousals (0 /h), 0 were PLM-related arousals (0 /h), and 104 were non-specific arousals (17 /h). EEG:  PSG EEG was of normal amplitude and frequency, with symmetric manifestation of sleep stages. EKG: The electrocardiogram documented NSR.  The average heart rate during sleep was 69 bpm.  The heart rate during sleep varied between a minimum of 55 bpm and maximum of  95 bpm. AUDIO and VIDEO:  The patient did move frequently during sleep, resuming all sleep positions but prone sleep. There were no complex movements in sleep recorded , no vocalizations captured.  IMPRESSION: 1) Sleep disordered breathing ( apnea, hypopnea, snoring) was not present.  2) All stages of sleep were recorded, none was associated with hypoxia, or abnormal EKG / EEG/ EMG  findings.    3) Total sleep time was normal at 371.5 minutes.  Sleep efficiency was normal at 90.7%.  Slow wave N3 sleep was present between 23.45 and 1 AM, as physiologically normal  and REM sleep increased in duration with each cycle.    RECOMMENDATIONS: This PSG shows proportionally normal sleep architecture with fragmentation by non-physiological arousals, also called "spontaneous'  or 'non-specific' . No nocturia event was present.   Larey Seat,  MD           .

## 2021-11-12 ENCOUNTER — Telehealth: Payer: Self-pay | Admitting: *Deleted

## 2021-11-12 NOTE — Telephone Encounter (Signed)
-----   Message from Larey Seat, MD sent at 11/11/2021  4:38 PM EDT ----- Frequent micro arousals but overall very good sleep efficiency. The cause for these seconds of sleep interruption remains unknown.  In total , there was sufficient sleep time and good sleep architecture noted.   PS: I wonder if Ms. [name] [name] could benefit from low dose progesterone at night, or changing to a Combi -Patch HRT. There is anecdotal evidence of sleep improvement under progesterone.

## 2021-11-20 ENCOUNTER — Encounter: Payer: Self-pay | Admitting: Internal Medicine

## 2021-11-26 DIAGNOSIS — H10413 Chronic giant papillary conjunctivitis, bilateral: Secondary | ICD-10-CM | POA: Diagnosis not present

## 2021-11-26 DIAGNOSIS — H04123 Dry eye syndrome of bilateral lacrimal glands: Secondary | ICD-10-CM | POA: Diagnosis not present

## 2021-11-26 DIAGNOSIS — H53143 Visual discomfort, bilateral: Secondary | ICD-10-CM | POA: Diagnosis not present

## 2021-11-26 DIAGNOSIS — G43B0 Ophthalmoplegic migraine, not intractable: Secondary | ICD-10-CM | POA: Diagnosis not present

## 2021-12-01 ENCOUNTER — Telehealth: Payer: Self-pay | Admitting: Internal Medicine

## 2021-12-01 NOTE — Telephone Encounter (Signed)
Inbound call from patient stating that she is schedule to have a colonoscopy tomorrow with Dr. Carlean Purl at 8:30. Patient stated that last night she took an Aleve and forgot she was not supposed to take the medication. Patient is requesting a call back to discuss. Please advise.

## 2021-12-01 NOTE — Telephone Encounter (Signed)
Returned the patient call.  No answer left message on machine letting her know it is ok she took an aleve to hold off taking anymore until after her procedure.

## 2021-12-02 ENCOUNTER — Ambulatory Visit (AMBULATORY_SURGERY_CENTER): Payer: BC Managed Care – PPO | Admitting: Internal Medicine

## 2021-12-02 ENCOUNTER — Encounter: Payer: Self-pay | Admitting: Internal Medicine

## 2021-12-02 VITALS — BP 147/84 | HR 62 | Temp 98.9°F | Resp 12 | Ht 66.0 in | Wt 137.0 lb

## 2021-12-02 DIAGNOSIS — Z1211 Encounter for screening for malignant neoplasm of colon: Secondary | ICD-10-CM | POA: Diagnosis not present

## 2021-12-02 MED ORDER — SODIUM CHLORIDE 0.9 % IV SOLN: 500.0000 mL | INTRAVENOUS | Status: DC

## 2021-12-02 NOTE — Progress Notes (Signed)
Pt's states no medical or surgical changes since previsit or office visit. 

## 2021-12-02 NOTE — Progress Notes (Signed)
[facility] History and Physical   Primary Care Physician:  Carollee Herter, Alferd Apa, DO   Reason for Procedure:   CRCA screening  Plan:    colonoscopy     HPI: [name] [name] is a 46 y.o. female here for screening exam   Past Medical History:  Diagnosis Date   Allergy    Basal cell carcinoma    abdomen   Celiac disease 2009   Functional dyspepsia 09/05/2012   Gastritis    GERD (gastroesophageal reflux disease) 08/08/2012   Guillain Barr syndrome 04/01/2007   After influenza vaccine   Hyperthyroidism 04/01/2007   Hypoglycemia, unspecified 10/26/2007    Past Surgical History:  Procedure Laterality Date   CESAREAN SECTION     COLONOSCOPY  05/12/2007   Carlean Purl   ESOPHAGOGASTRODUODENOSCOPY     SKIN CANCER EXCISION     abdomin   WISDOM TOOTH EXTRACTION      Prior to Admission medications   Medication Sig Start Date End Date Taking? Authorizing Provider  NON FORMULARY at bedtime. CBD   Yes [provider]  Norethindrone Acetate-Ethinyl Estradiol (LOESTRIN 1.5/30, 21,) 1.5-30 MG-MCG tablet Take 1 tablet by mouth daily. 03/25/21  Yes Harraway-Smith, Hoyle Sauer, MD  tretinoin (RETIN-A) 0.05 % cream Apply topically at bedtime. 02/23/19  Yes Roma Schanz R, DO  clindamycin (CLINDAGEL) 1 % gel Apply 1 Application topically 2 (two) times daily. 10/08/21   [provider]  Ferrous Sulfate (IRON) 325 (65 FE) MG TABS Take 65 mg by mouth daily.    [provider]  glucosamine-chondroitin 500-400 MG tablet Take 1 tablet by mouth 3 (three) times daily.    [provider]  imiquimod (ALDARA) 5 % cream Apply topically. 10/08/21   [provider]  Multiple Vitamin (MULTIVITAMIN) tablet Take 1 tablet by mouth daily.    [provider]  Probiotic Product (PROBIOTIC ADVANCED PO) Take by mouth daily.    [provider]    Current Outpatient Medications  Medication Sig Dispense Refill   NON FORMULARY at bedtime.  CBD     Norethindrone Acetate-Ethinyl Estradiol (LOESTRIN 1.5/30, 21,) 1.5-30 MG-MCG tablet Take 1 tablet by mouth daily. 84 tablet 4   tretinoin (RETIN-A) 0.05 % cream Apply topically at bedtime. 45 g 0   clindamycin (CLINDAGEL) 1 % gel Apply 1 Application topically 2 (two) times daily.     Ferrous Sulfate (IRON) 325 (65 FE) MG TABS Take 65 mg by mouth daily.     glucosamine-chondroitin 500-400 MG tablet Take 1 tablet by mouth 3 (three) times daily.     imiquimod (ALDARA) 5 % cream Apply topically.     Multiple Vitamin (MULTIVITAMIN) tablet Take 1 tablet by mouth daily.     Probiotic Product (PROBIOTIC ADVANCED PO) Take by mouth daily.     Current Facility-Administered Medications  Medication Dose Route Frequency Provider Last Rate Last Admin   0.9 %  sodium chloride infusion  500 mL Intravenous Continuous Gatha Mayer, MD        Allergies as of 12/02/2021 - Review Complete 12/02/2021  Allergen Reaction Noted   Gluten meal Other (See Comments) 11/05/2021   Influenza vac split quad  05/22/2009    Family History  Problem Relation Age of Onset   Leukemia Mother 47   Hypertension Mother    Stroke Mother    Memory loss Mother    Hypertension Father    Hyperlipidemia Father    Diabetes Father    Prostate cancer Father 84  Depression Father    Hypertension Sister    Bipolar disorder Sister    Stroke Maternal Grandmother    Multiple sclerosis Cousin    Lupus Cousin    Hypertension Other        siblings   Autoimmune disease Other        numerous family members on her mother's side   Colon cancer Neg Hx    Esophageal cancer Neg Hx    Stomach cancer Neg Hx    Rectal cancer Neg Hx    Colon polyps Neg Hx     Social History   Socioeconomic History   Marital status: Married    Spouse name: [name] [name]   Number of children: 2   Years of education: 16   Highest education level: Bachelor's degree (e.g., BA, AB, BS)  Occupational History   Occupation: Financial risk analyst: [employer]  Tobacco Use   Smoking status: Former    Types: Cigarettes    Quit date: 01/19/1998    Years since quitting: 23.8   Smokeless tobacco: Never   Tobacco comments:    About age 66   Vaping Use   Vaping Use: Never used  Substance and Sexual Activity   Alcohol use: Yes    Alcohol/week: 4.0 - 8.0 standard drinks of alcohol    Types: 4 - 8 Glasses of wine per week   Drug use: Yes    Comment: CBD   Sexual activity: Yes    Partners: Male  Other Topics Concern   Not on file  Social History Narrative   Married with 2 children   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  Physical Exam: Vital signs BP 120/76   Pulse 64   Temp 98.9 F (37.2 C)   Ht 5' 6"  (1.676 m)   Wt 137 lb (62.1 kg)   SpO2 100%   BMI 22.11 kg/m   General:   Alert,  Well-developed, well-nourished, pleasant and cooperative in NAD Lungs:  Clear throughout to auscultation.   Heart:  Regular rate and rhythm; no murmurs, clicks, rubs,  or gallops. Abdomen:  Soft, nontender and nondistended. Normal bowel sounds.   Neuro/Psych:  Alert and cooperative. Normal mood and affect. A and O x 3   @[name] [name]  [name] Maffucci, MD, [facility] [phone] (pager) 12/02/2021 7:59 AM@

## 2021-12-02 NOTE — Progress Notes (Signed)
Pt resting comfortably. VSS. Airway intact. SBAR complete to RN. All questions answered.

## 2021-12-02 NOTE — Op Note (Signed)
[place] Patient Name: [name] [name] Procedure Date: 12/02/2021 8:30 AM MRN: [id number] Endoscopist: Gatha Mayer , MD Age: 46 Referring MD:  Date of Birth: 08-12-1975 Gender: Female Account #: [id number] Procedure:                Colonoscopy Indications:              Screening for colorectal malignant neoplasm, Last                            colonoscopy: 2009 Medicines:                Monitored Anesthesia Care Procedure:                Pre-Anesthesia Assessment:                           - Prior to the procedure, a History and Physical                            was performed, and patient medications and                            allergies were reviewed. The patient's tolerance of                            previous anesthesia was also reviewed. The risks                            and benefits of the procedure and the sedation                            options and risks were discussed with the patient.                            All questions were answered, and informed consent                            was obtained. Prior Anticoagulants: The patient has                            taken no previous anticoagulant or antiplatelet                            agents. ASA Grade Assessment: II - A patient with                            mild systemic disease. After reviewing the risks                            and benefits, the patient was deemed in                            satisfactory condition to undergo the procedure.  After obtaining informed consent, the colonoscope                            was passed under direct vision. Throughout the                            procedure, the patient's blood pressure, pulse, and                            oxygen saturations were monitored continuously. The                            Olympus PCF-H190DL [id number]) Colonoscope was                            introduced through the anus and advanced  to the the                            cecum, identified by appendiceal orifice and                            ileocecal valve. The colonoscopy was performed                            without difficulty. The patient tolerated the                            procedure well. The quality of the bowel                            preparation was excellent. The bowel preparation                            used was Miralax via split dose instruction. The                            ileocecal valve, appendiceal orifice, and rectum                            were photographed. Scope In: 8:40:32 AM Scope Out: 8:52:24 AM Scope Withdrawal Time: 0 hours 8 minutes 43 seconds  Total Procedure Duration: 0 hours 11 minutes 52 seconds  Findings:                 The perianal and digital rectal examinations were                            normal.                           Multiple diverticula were found in the sigmoid                            colon.  The exam was otherwise without abnormality on                            direct and retroflexion views. Complications:            No immediate complications. Estimated Blood Loss:     Estimated blood loss: none. Impression:               - Diverticulosis in the sigmoid colon.                           - The examination was otherwise normal on direct                            and retroflexion views.                           - No specimens collected. Recommendation:           - Patient has a contact number available for                            emergencies. The signs and symptoms of potential                            delayed complications were discussed with the                            patient. Return to normal activities tomorrow.                            Written discharge instructions were provided to the                            patient.                           - Resume previous diet.                           -  Continue present medications.                           - Repeat colonoscopy in 10 years for screening                            purposes. Gatha Mayer, MD 12/02/2021 8:59:02 AM This report has been signed electronically.

## 2021-12-02 NOTE — Patient Instructions (Addendum)
No polyps or cancer were seen.  You do have diverticulosis - thickened muscle rings and pouches in the colon wall. Please read the handout about this condition.  I appreciate the opportunity to care for you. Gatha Mayer, MD, FA   HANDOUT ON DIVERTICULOSIS GIVEN TO YOU TODAY    YOU HAD AN ENDOSCOPIC PROCEDURE TODAY AT [facility]:   Refer to the procedure report that was given to you for any specific questions about what was found during the examination.  If the procedure report does not answer your questions, please call your gastroenterologist to clarify.  If you requested that your care partner not be given the details of your procedure findings, then the procedure report has been included in a sealed envelope for you to review at your convenience later.  YOU SHOULD EXPECT: Some feelings of bloating in the abdomen. Passage of more gas than usual.  Walking can help get rid of the air that was put into your GI tract during the procedure and reduce the bloating. If you had a lower endoscopy (such as a colonoscopy or flexible sigmoidoscopy) you may notice spotting of blood in your stool or on the toilet paper. If you underwent a bowel prep for your procedure, you may not have a normal bowel movement for a few days.  Please Note:  You might notice some irritation and congestion in your nose or some drainage.  This is from the oxygen used during your procedure.  There is no need for concern and it should clear up in a day or so.  SYMPTOMS TO REPORT IMMEDIATELY:  Following lower endoscopy (colonoscopy or flexible sigmoidoscopy):  Excessive amounts of blood in the stool  Significant tenderness or worsening of abdominal pains  Swelling of the abdomen that is new, acute  Fever of 100F or higher  For urgent or emergent issues, a gastroenterologist can be reached at any hour by calling [phone]. Do not use MyChart messaging for urgent concerns.    DIET:  We do  recommend a small meal at first, but then you may proceed to your regular diet.  Drink plenty of fluids but you should avoid alcoholic beverages for 24 hours.  ACTIVITY:  You should plan to take it easy for the rest of today and you should NOT DRIVE or use heavy machinery until tomorrow (because of the sedation medicines used during the test).    FOLLOW UP: Our staff will call the number listed on your records the next business day following your procedure.  We will call around 7:15- 8:00 am to check on you and address any questions or concerns that you may have regarding the information given to you following your procedure. If we do not reach you, we will leave a message.     If any biopsies were taken you will be contacted by phone or by letter within the next 1-3 weeks.  Please call us at [phone] if you have not heard about the biopsies in 3 weeks.    SIGNATURES/CONFIDENTIALITY: You and/or your care partner have signed paperwork which will be entered into your electronic medical record.  These signatures attest to the fact that that the information above on your After Visit Summary has been reviewed and is understood.  Full responsibility of the confidentiality of this discharge information lies with you and/or your care-partner.

## 2021-12-03 ENCOUNTER — Other Ambulatory Visit: Payer: Self-pay

## 2021-12-03 ENCOUNTER — Telehealth: Payer: Self-pay

## 2021-12-03 NOTE — Telephone Encounter (Signed)
  Follow up Call-     12/02/2021    7:51 AM  Call back number  Post procedure Call Back phone  # [phone]  Permission to leave phone message Yes     Patient questions:  Do you have a fever, pain , or abdominal swelling? No. Pain Score  0 *  Have you tolerated food without any problems? Yes.    Have you been able to return to your normal activities? Yes.    Do you have any questions about your discharge instructions: Diet   No. Medications  No. Follow up visit  No.  Do you have questions or concerns about your Care? No.  Actions: * If pain score is 4 or above: No action needed, pain <4.

## 2021-12-04 DIAGNOSIS — M7711 Lateral epicondylitis, right elbow: Secondary | ICD-10-CM | POA: Diagnosis not present

## 2021-12-04 DIAGNOSIS — M9907 Segmental and somatic dysfunction of upper extremity: Secondary | ICD-10-CM | POA: Diagnosis not present

## 2021-12-04 DIAGNOSIS — M9905 Segmental and somatic dysfunction of pelvic region: Secondary | ICD-10-CM | POA: Diagnosis not present

## 2021-12-04 DIAGNOSIS — M9902 Segmental and somatic dysfunction of thoracic region: Secondary | ICD-10-CM | POA: Diagnosis not present

## 2021-12-04 DIAGNOSIS — M9903 Segmental and somatic dysfunction of lumbar region: Secondary | ICD-10-CM | POA: Diagnosis not present

## 2021-12-09 ENCOUNTER — Ambulatory Visit: Payer: BC Managed Care – PPO | Admitting: Adult Health

## 2021-12-11 DIAGNOSIS — M9903 Segmental and somatic dysfunction of lumbar region: Secondary | ICD-10-CM | POA: Diagnosis not present

## 2021-12-11 DIAGNOSIS — M9902 Segmental and somatic dysfunction of thoracic region: Secondary | ICD-10-CM | POA: Diagnosis not present

## 2021-12-11 DIAGNOSIS — M7711 Lateral epicondylitis, right elbow: Secondary | ICD-10-CM | POA: Diagnosis not present

## 2021-12-11 DIAGNOSIS — M9907 Segmental and somatic dysfunction of upper extremity: Secondary | ICD-10-CM | POA: Diagnosis not present

## 2021-12-11 DIAGNOSIS — M9905 Segmental and somatic dysfunction of pelvic region: Secondary | ICD-10-CM | POA: Diagnosis not present

## 2021-12-18 DIAGNOSIS — M9907 Segmental and somatic dysfunction of upper extremity: Secondary | ICD-10-CM | POA: Diagnosis not present

## 2021-12-18 DIAGNOSIS — M7711 Lateral epicondylitis, right elbow: Secondary | ICD-10-CM | POA: Diagnosis not present

## 2021-12-18 DIAGNOSIS — M9905 Segmental and somatic dysfunction of pelvic region: Secondary | ICD-10-CM | POA: Diagnosis not present

## 2021-12-18 DIAGNOSIS — M9902 Segmental and somatic dysfunction of thoracic region: Secondary | ICD-10-CM | POA: Diagnosis not present

## 2021-12-18 DIAGNOSIS — M9903 Segmental and somatic dysfunction of lumbar region: Secondary | ICD-10-CM | POA: Diagnosis not present

## 2022-01-05 DIAGNOSIS — M7711 Lateral epicondylitis, right elbow: Secondary | ICD-10-CM | POA: Diagnosis not present

## 2022-01-05 DIAGNOSIS — M9905 Segmental and somatic dysfunction of pelvic region: Secondary | ICD-10-CM | POA: Diagnosis not present

## 2022-01-05 DIAGNOSIS — M9903 Segmental and somatic dysfunction of lumbar region: Secondary | ICD-10-CM | POA: Diagnosis not present

## 2022-01-05 DIAGNOSIS — M9902 Segmental and somatic dysfunction of thoracic region: Secondary | ICD-10-CM | POA: Diagnosis not present

## 2022-01-05 DIAGNOSIS — M9907 Segmental and somatic dysfunction of upper extremity: Secondary | ICD-10-CM | POA: Diagnosis not present

## 2022-01-22 DIAGNOSIS — M9902 Segmental and somatic dysfunction of thoracic region: Secondary | ICD-10-CM | POA: Diagnosis not present

## 2022-01-22 DIAGNOSIS — M9905 Segmental and somatic dysfunction of pelvic region: Secondary | ICD-10-CM | POA: Diagnosis not present

## 2022-01-22 DIAGNOSIS — M7711 Lateral epicondylitis, right elbow: Secondary | ICD-10-CM | POA: Diagnosis not present

## 2022-01-22 DIAGNOSIS — M9903 Segmental and somatic dysfunction of lumbar region: Secondary | ICD-10-CM | POA: Diagnosis not present

## 2022-02-02 DIAGNOSIS — M9902 Segmental and somatic dysfunction of thoracic region: Secondary | ICD-10-CM | POA: Diagnosis not present

## 2022-02-02 DIAGNOSIS — M9907 Segmental and somatic dysfunction of upper extremity: Secondary | ICD-10-CM | POA: Diagnosis not present

## 2022-02-02 DIAGNOSIS — M7711 Lateral epicondylitis, right elbow: Secondary | ICD-10-CM | POA: Diagnosis not present

## 2022-02-02 DIAGNOSIS — M9903 Segmental and somatic dysfunction of lumbar region: Secondary | ICD-10-CM | POA: Diagnosis not present

## 2022-02-02 DIAGNOSIS — M9905 Segmental and somatic dysfunction of pelvic region: Secondary | ICD-10-CM | POA: Diagnosis not present

## 2022-02-24 DIAGNOSIS — M7711 Lateral epicondylitis, right elbow: Secondary | ICD-10-CM | POA: Diagnosis not present

## 2022-02-24 DIAGNOSIS — M9903 Segmental and somatic dysfunction of lumbar region: Secondary | ICD-10-CM | POA: Diagnosis not present

## 2022-02-24 DIAGNOSIS — M9905 Segmental and somatic dysfunction of pelvic region: Secondary | ICD-10-CM | POA: Diagnosis not present

## 2022-02-24 DIAGNOSIS — M9907 Segmental and somatic dysfunction of upper extremity: Secondary | ICD-10-CM | POA: Diagnosis not present

## 2022-02-24 DIAGNOSIS — M9902 Segmental and somatic dysfunction of thoracic region: Secondary | ICD-10-CM | POA: Diagnosis not present

## 2022-03-23 ENCOUNTER — Encounter: Payer: BC Managed Care – PPO | Admitting: Family Medicine

## 2022-03-24 DIAGNOSIS — M7711 Lateral epicondylitis, right elbow: Secondary | ICD-10-CM | POA: Diagnosis not present

## 2022-03-24 DIAGNOSIS — M9902 Segmental and somatic dysfunction of thoracic region: Secondary | ICD-10-CM | POA: Diagnosis not present

## 2022-03-24 DIAGNOSIS — M9905 Segmental and somatic dysfunction of pelvic region: Secondary | ICD-10-CM | POA: Diagnosis not present

## 2022-03-24 DIAGNOSIS — M9903 Segmental and somatic dysfunction of lumbar region: Secondary | ICD-10-CM | POA: Diagnosis not present

## 2022-03-30 ENCOUNTER — Encounter: Payer: BC Managed Care – PPO | Admitting: Family Medicine

## 2022-04-02 IMAGING — MG MM DIGITAL SCREENING BILAT W/ TOMO AND CAD
6 of 12 series · 6 of 36 positions shown · non-contrast
Comparison: Previous exam(s).

CLINICAL DATA: Screening.

EXAM:
DIGITAL SCREENING BILATERAL MAMMOGRAM WITH TOMOSYNTHESIS AND CAD
TECHNIQUE: Bilateral screening digital craniocaudal and mediolateral oblique
mammograms were obtained. Bilateral screening digital breast
tomosynthesis was performed. The images were evaluated with
computer-aided detection.

[R MLO synth-2D]
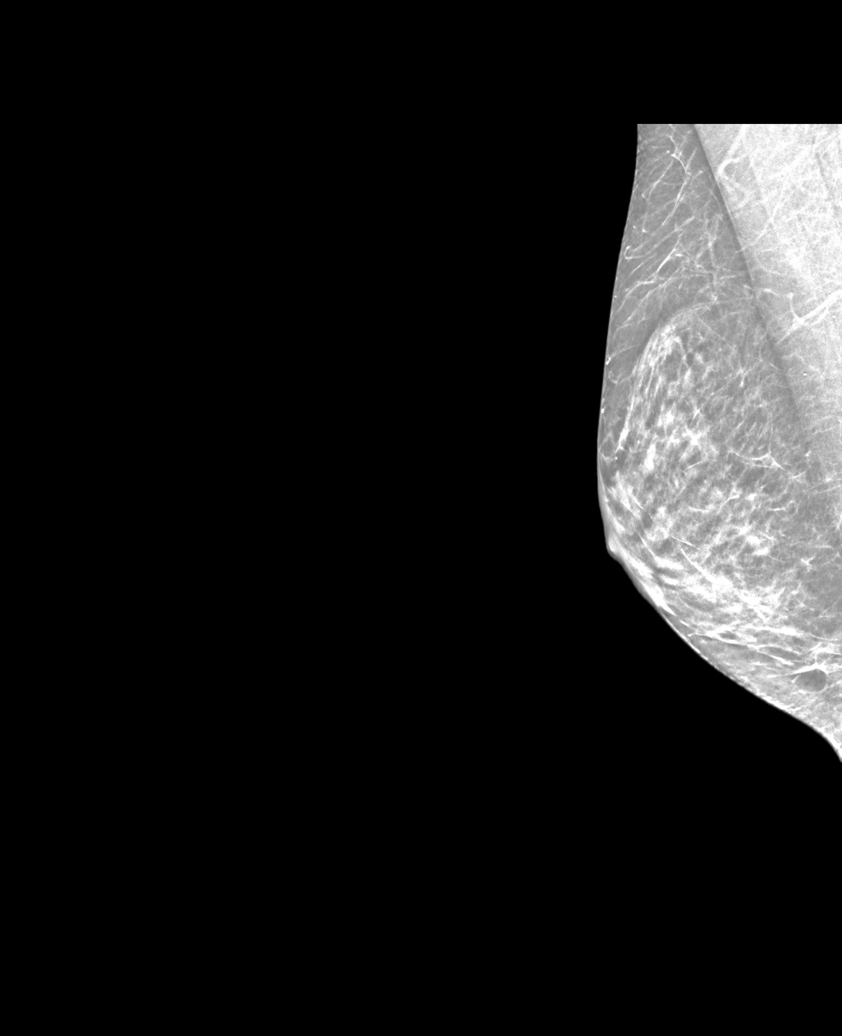

[R XCCL synth-2D]
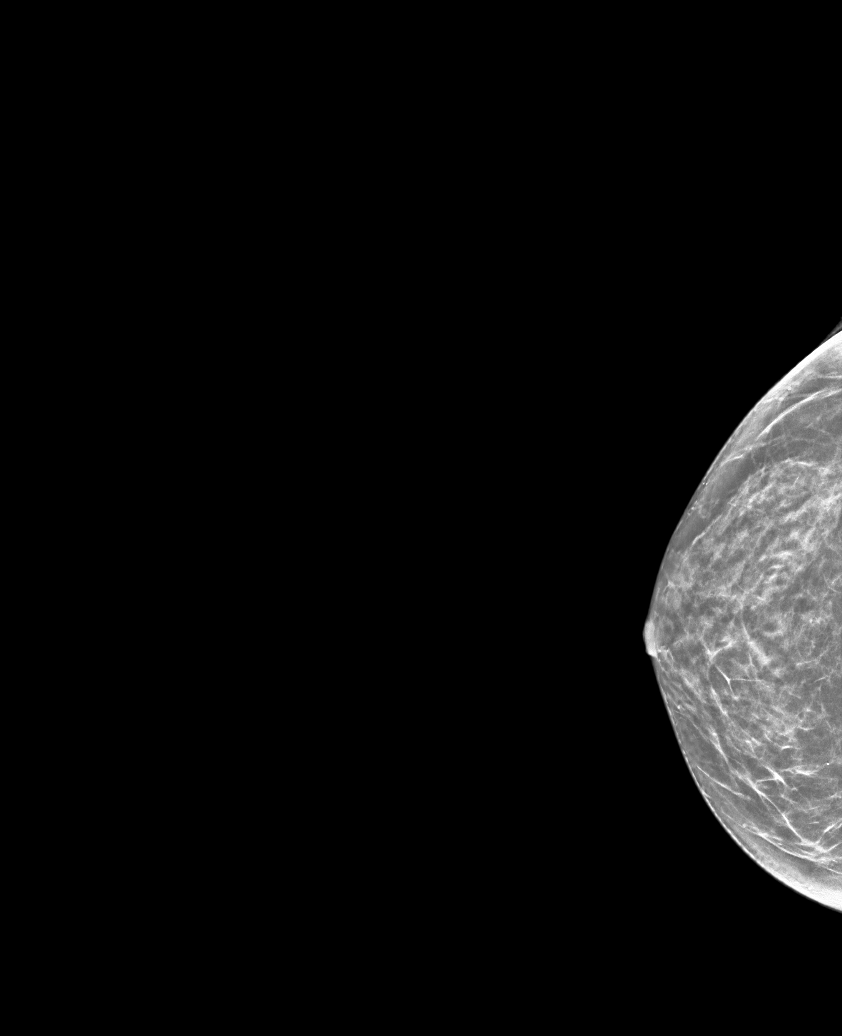

[L XCCL synth-2D]
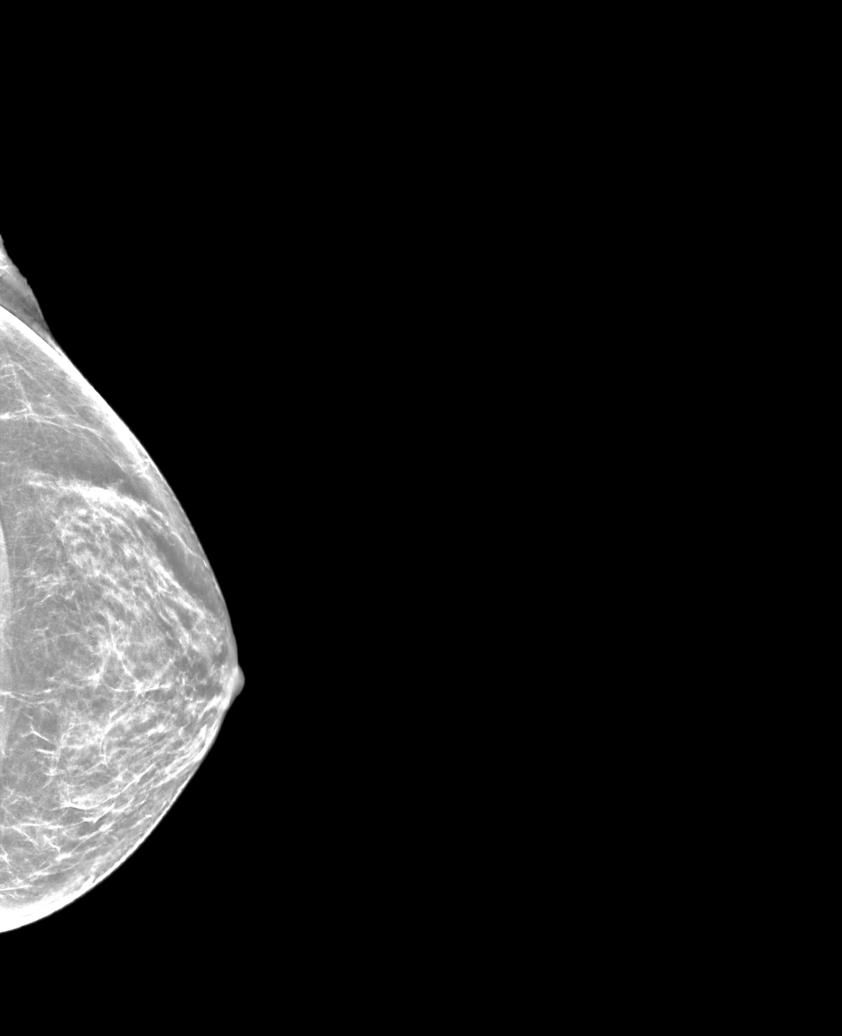

[L MLO synth-2D]
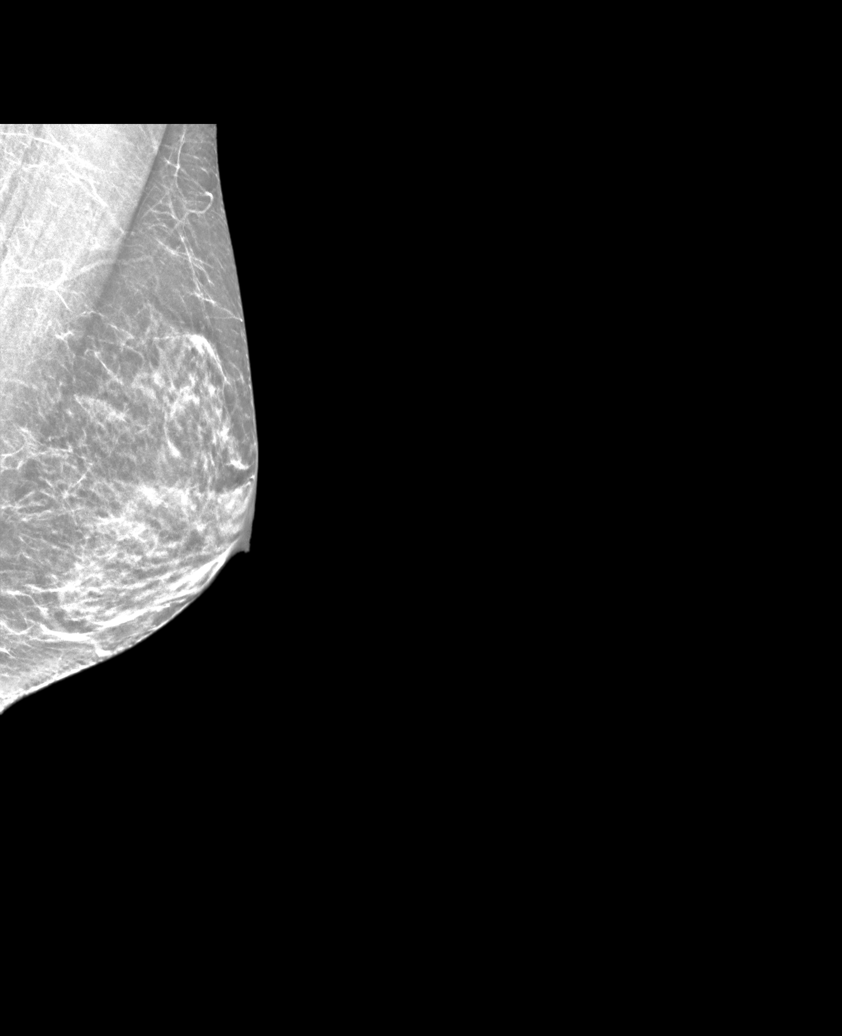

[L CC synth-2D]
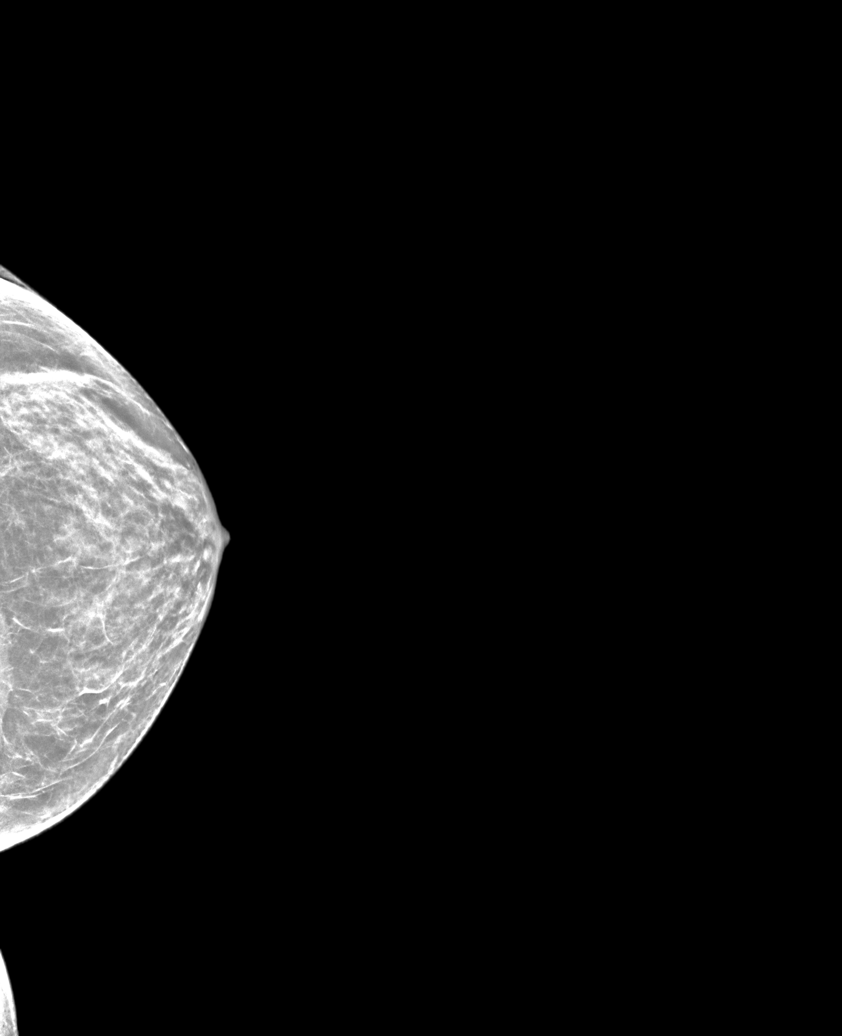

[R CC synth-2D]
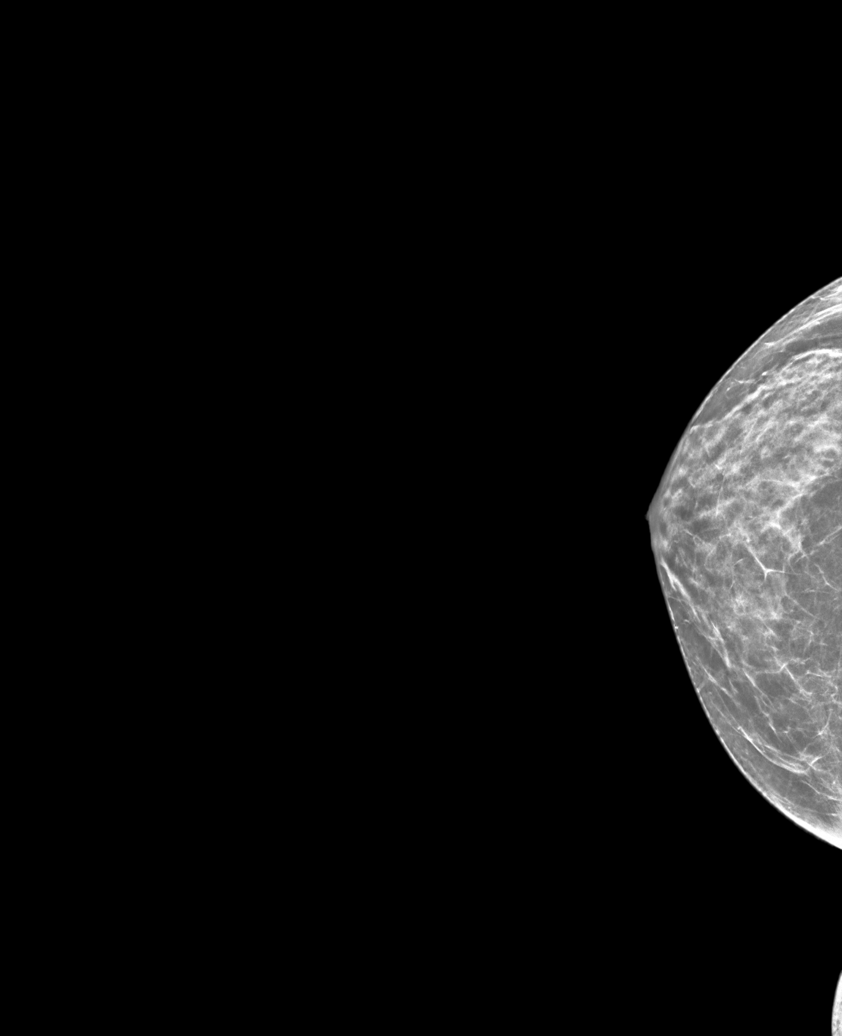

[6 of 36 positions shown; findings below may reference images not displayed]

ACR Breast Density Category c: The breast tissue is heterogeneously
dense, which may obscure small masses.
FINDINGS: There are no findings suspicious for malignancy.
IMPRESSION: No mammographic evidence of malignancy. A result letter of this
screening mammogram will be mailed directly to the patient.

RECOMMENDATION:
Screening mammogram in one year. (Code:Q3-[id number])

BI-RADS CATEGORY  1: Negative.

## 2022-04-03 ENCOUNTER — Encounter: Payer: Self-pay | Admitting: General Practice

## 2022-04-20 ENCOUNTER — Other Ambulatory Visit (HOSPITAL_BASED_OUTPATIENT_CLINIC_OR_DEPARTMENT_OTHER): Payer: Self-pay | Admitting: Family Medicine

## 2022-04-20 DIAGNOSIS — Z1231 Encounter for screening mammogram for malignant neoplasm of breast: Secondary | ICD-10-CM

## 2022-04-28 DIAGNOSIS — M7711 Lateral epicondylitis, right elbow: Secondary | ICD-10-CM | POA: Diagnosis not present

## 2022-04-28 DIAGNOSIS — M9905 Segmental and somatic dysfunction of pelvic region: Secondary | ICD-10-CM | POA: Diagnosis not present

## 2022-04-28 DIAGNOSIS — M9903 Segmental and somatic dysfunction of lumbar region: Secondary | ICD-10-CM | POA: Diagnosis not present

## 2022-04-28 DIAGNOSIS — M9902 Segmental and somatic dysfunction of thoracic region: Secondary | ICD-10-CM | POA: Diagnosis not present

## 2022-05-04 ENCOUNTER — Encounter (HOSPITAL_BASED_OUTPATIENT_CLINIC_OR_DEPARTMENT_OTHER): Payer: Self-pay

## 2022-05-04 ENCOUNTER — Ambulatory Visit (HOSPITAL_BASED_OUTPATIENT_CLINIC_OR_DEPARTMENT_OTHER)
Admission: RE | Admit: 2022-05-04 | Discharge: 2022-05-04 | Disposition: A | Discharge status: Home / Self Care | Payer: BC Managed Care – PPO | Source: Ambulatory Visit | Attending: Family Medicine | Admitting: Family Medicine

## 2022-05-04 DIAGNOSIS — Z1231 Encounter for screening mammogram for malignant neoplasm of breast: Secondary | ICD-10-CM | POA: Diagnosis not present

## 2022-05-07 ENCOUNTER — Encounter: Payer: Self-pay | Admitting: Family Medicine

## 2022-05-07 ENCOUNTER — Ambulatory Visit (INDEPENDENT_AMBULATORY_CARE_PROVIDER_SITE_OTHER): Payer: BC Managed Care – PPO | Admitting: Family Medicine

## 2022-05-07 VITALS — BP 110/80 | HR 60 | Temp 98.9°F | Resp 18 | Ht 66.0 in | Wt 141.2 lb

## 2022-05-07 DIAGNOSIS — Z0000 Encounter for general adult medical examination without abnormal findings: Secondary | ICD-10-CM | POA: Insufficient documentation

## 2022-05-07 HISTORY — DX: Encounter for general adult medical examination without abnormal findings: Z00.00

## 2022-05-07 LAB — CBC WITH DIFFERENTIAL/PLATELET
Basophils Absolute: 0.1 10*3/uL (ref 0.0–0.1)
Basophils Relative: 0.9 % (ref 0.0–3.0)
Eosinophils Absolute: 0 10*3/uL (ref 0.0–0.7)
Eosinophils Relative: 0.6 % (ref 0.0–5.0)
HCT: 42.2 % (ref 36.0–46.0)
Hemoglobin: 14.3 g/dL (ref 12.0–15.0)
Lymphocytes Relative: 29.2 % (ref 12.0–46.0)
Lymphs Abs: 1.8 10*3/uL (ref 0.7–4.0)
MCHC: 33.9 g/dL (ref 30.0–36.0)
MCV: 91.7 fl (ref 78.0–100.0)
Monocytes Absolute: 0.3 10*3/uL (ref 0.1–1.0)
Monocytes Relative: 5.8 % (ref 3.0–12.0)
Neutro Abs: 3.8 10*3/uL (ref 1.4–7.7)
Neutrophils Relative %: 63.5 % (ref 43.0–77.0)
Platelets: 387 10*3/uL (ref 150.0–400.0)
RBC: 4.6 Mil/uL (ref 3.87–5.11)
RDW: 13.3 % (ref 11.5–15.5)
WBC: 6 10*3/uL (ref 4.0–10.5)

## 2022-05-07 LAB — LIPID PANEL
Cholesterol: 176 mg/dL (ref 0–200)
HDL: 74.3 mg/dL (ref >39.00)
LDL Cholesterol: 93 mg/dL (ref 0–99)
NonHDL: 101.61
Total CHOL/HDL Ratio: 2
Triglycerides: 44 mg/dL (ref 0.0–149.0)
VLDL: 8.8 mg/dL (ref 0.0–40.0)

## 2022-05-07 LAB — COMPREHENSIVE METABOLIC PANEL
ALT: 11 U/L (ref 0–35)
AST: 13 U/L (ref 0–37)
Albumin: 4.3 g/dL (ref 3.5–5.2)
Alkaline Phosphatase: 31 U/L — ABNORMAL LOW (ref 39–117)
BUN: 10 mg/dL (ref 6–23)
CO2: 25 mEq/L (ref 19–32)
Calcium: 9.2 mg/dL (ref 8.4–10.5)
Chloride: 105 mEq/L (ref 96–112)
Creatinine, Ser: 0.88 mg/dL (ref 0.40–1.20)
GFR: 79.12 mL/min (ref >60.00)
Glucose, Bld: 77 mg/dL (ref 70–99)
Potassium: 4.3 mEq/L (ref 3.5–5.1)
Sodium: 138 mEq/L (ref 135–145)
Total Bilirubin: 0.5 mg/dL (ref 0.2–1.2)
Total Protein: 7 g/dL (ref 6.0–8.3)

## 2022-05-07 LAB — TSH: TSH: 1.65 u[IU]/mL (ref 0.35–5.50)

## 2022-05-07 NOTE — Assessment & Plan Note (Signed)
Ghm utd Check labs  See avsl

## 2022-05-07 NOTE — Progress Notes (Signed)
Subjective:   By signing my name below, I, [name], attest that this documentation has been prepared under the direction and in the presence of Ann Held, DO. 05/07/2022   Patient ID: [name] [name], female    DOB: [birth year], 47 y.o.   MRN: [id number]  Chief Complaint  Patient presents with   Annual Exam    Pt states fasting     HPI Patient is in today for a comprehensive physical exam.   She continues following up with her GYN specialist and chiropractor regularly. She denies fever, new moles, congestion, sinus pain, sore throat, chest pain, palpitations, cough, shortness of breath, wheezing, nausea, vomiting, abdominal pain, diarrhea, constipation, dysuria, frequency, hematuria, new muscle pain, new joint pain, or headaches at this time.  She has no changes to her family medical history. She had a colonoscopy since her last visit, otherwise she has no changes to her family medical history.  She has 3 Covid-19 vaccines at this time.  Mammogram was last completed 05/04/2022. Results are normal. Repeat in 1 year.  Colonoscopy was last completed 12/02/2021. Results showed: - Diverticulosis in the sigmoid colon. - The examination was otherwise normal on direct and retroflexion views. - No specimens collected. - Repeat in 10 years.  Pap smear was last completed 03/19/2021. Results are normal. Repeat in 3 years.    Past Medical History:  Diagnosis Date   Allergy    Basal cell carcinoma    abdomen   Celiac disease 2009   Functional dyspepsia 09/05/2012   Gastritis    GERD (gastroesophageal reflux disease) 08/08/2012   Guillain Barr syndrome 04/01/2007   After influenza vaccine   Hyperthyroidism 04/01/2007   Hypoglycemia, unspecified 10/26/2007    Past Surgical History:  Procedure Laterality Date   CESAREAN SECTION     COLONOSCOPY  05/12/2007   Carlean Purl   ESOPHAGOGASTRODUODENOSCOPY     SKIN CANCER EXCISION     abdomin   WISDOM TOOTH EXTRACTION       Family History  Problem Relation Age of Onset   Leukemia Mother 23   Hypertension Mother    Stroke Mother    Memory loss Mother    Hypertension Father    Hyperlipidemia Father    Diabetes Father    Prostate cancer Father 10   Depression Father    Hypertension Sister    Bipolar disorder Sister    Stroke Maternal Grandmother    Multiple sclerosis Cousin    Lupus Cousin    Hypertension Other        siblings   Autoimmune disease Other        numerous family members on her mother's side   Colon cancer Neg Hx    Esophageal cancer Neg Hx    Stomach cancer Neg Hx    Rectal cancer Neg Hx    Colon polyps Neg Hx     Social History   Socioeconomic History   Marital status: Married    Spouse name: [name]   Number of children: 2   Years of education: 16   Highest education level: Bachelor's degree (e.g., BA, AB, BS)  Occupational History   Occupation: Pension scheme manager: [employer]  Tobacco Use   Smoking status: Former    Types: Cigarettes    Quit date: 01/19/1998    Years since quitting: 24.3   Smokeless tobacco: Never   Tobacco comments:    About age 43   Vaping Use   Vaping Use:  Never used  Substance and Sexual Activity   Alcohol use: Yes    Alcohol/week: 4.0 - 8.0 standard drinks of alcohol    Types: 4 - 8 Glasses of wine per week   Drug use: Yes    Comment: CBD   Sexual activity: Yes    Partners: Male  Other Topics Concern   Not on file  Social History Narrative   Married with 2 children   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     Ferrous Sulfate (IRON) 325 (65 FE) MG TABS Take 65 mg by mouth daily.     glucosamine-chondroitin 500-400 MG tablet Take 1 tablet by mouth 3 (three) times daily.     imiquimod (ALDARA) 5 % cream Apply topically.     NON FORMULARY at bedtime. CBD     Norethindrone Acetate-Ethinyl Estradiol (LOESTRIN 1.5/30, 21,) 1.5-30 MG-MCG tablet Take 1 tablet by mouth daily. 84 tablet 4   Probiotic Product (PROBIOTIC ADVANCED PO) Take by mouth daily.     tretinoin (RETIN-A) 0.05 % cream Apply topically at bedtime. 45 g 0   Multiple Vitamin (MULTIVITAMIN) tablet Take 1 tablet by mouth daily.     No facility-administered medications prior to visit.    Allergies  Allergen Reactions   Gluten Meal Other (See Comments)   Influenza Vac Split Quad     REACTION: pt had Joellyn Quails--- can not have flu shot    Review of Systems  Constitutional:  Negative for fever.  HENT:  Negative for congestion, sinus pain and sore throat.   Respiratory:  Negative for cough, shortness of breath and wheezing.   Cardiovascular:  Negative for chest pain and palpitations.  Gastrointestinal:  Negative for abdominal pain, constipation, diarrhea, nausea and vomiting.  Genitourinary:  Negative for dysuria, frequency and hematuria.  Musculoskeletal:        (-)new muscle pain (-)new joint pain  Skin:        (-)New moles  Neurological:  Negative for headaches.       Objective:    Physical Exam Vitals and nursing note reviewed.  Constitutional:      General: She is not in acute distress.    Appearance: Normal appearance. She is not ill-appearing.  HENT:     Head: Normocephalic and atraumatic.     Right Ear: Tympanic membrane, ear canal and external ear normal.     Left Ear: Tympanic membrane, ear canal and external ear normal.  Eyes:      Extraocular Movements: Extraocular movements intact.     Pupils: Pupils are equal, round, and reactive to light.  Cardiovascular:     Rate and Rhythm: Normal rate and regular rhythm.     Heart sounds: Normal heart sounds. No murmur heard.    No gallop.  Pulmonary:     Effort: Pulmonary  effort is normal. No respiratory distress.     Breath sounds: Normal breath sounds. No wheezing or rales.  Abdominal:     General: Bowel sounds are normal. There is no distension.     Palpations: Abdomen is soft.     Tenderness: There is no abdominal tenderness. There is no guarding.  Skin:    General: Skin is warm and dry.  Neurological:     Mental Status: She is alert and oriented to person, place, and time.  Psychiatric:        Judgment: Judgment normal.     BP 110/80 (BP Location: Left Arm, Patient Position: Sitting, Cuff Size: Normal)   Pulse 60   Temp 98.9 F (37.2 C) (Oral)   Resp 18   Ht 5\' 6"  (1.676 m)   Wt 141 lb 3.2 oz (64 kg)   SpO2 96%   BMI 22.79 kg/m  Wt Readings from Last 3 Encounters:  05/07/22 141 lb 3.2 oz (64 kg)  12/02/21 137 lb (62.1 kg)  11/05/21 137 lb (62.1 kg)       Assessment & Plan:  Preventative health care Assessment & Plan: Ghm utd Check labs  See avsl  Orders: -     CBC with Differential/Platelet -     Comprehensive metabolic panel -     Lipid panel -     TSH    I, Ann Held, DO, personally preformed the services described in this documentation.  All medical record entries made by the scribe were at my direction and in my presence.  I have reviewed the chart and discharge instructions (if applicable) and agree that the record reflects my personal performance and is accurate and complete. 05/07/2022   I,[name],acting as a scribe for Ann Held, DO.,have documented all relevant documentation on the behalf of Ann Held, DO,as directed by  Ann Held, DO while in the presence of Ann Held,  DO.   Ann Held, DO

## 2022-05-07 NOTE — Patient Instructions (Signed)
Preventive Care 40-46 Years Old, Female Preventive care refers to lifestyle choices and visits with your health care provider that can promote health and wellness. Preventive care visits are also called wellness exams. What can I expect for my preventive care visit? Counseling Your health care provider may ask you questions about your: Medical history, including: Past medical problems. Family medical history. Pregnancy history. Current health, including: Menstrual cycle. Method of birth control. Emotional well-being. Home life and relationship well-being. Sexual activity and sexual health. Lifestyle, including: Alcohol, nicotine or tobacco, and drug use. Access to firearms. Diet, exercise, and sleep habits. Work and work environment. Sunscreen use. Safety issues such as seatbelt and bike helmet use. Physical exam Your health care provider will check your: Height and weight. These may be used to calculate your BMI (body mass index). BMI is a measurement that tells if you are at a healthy weight. Waist circumference. This measures the distance around your waistline. This measurement also tells if you are at a healthy weight and may help predict your risk of certain diseases, such as type 2 diabetes and high blood pressure. Heart rate and blood pressure. Body temperature. Skin for abnormal spots. What immunizations do I need?  Vaccines are usually given at various ages, according to a schedule. Your health care provider will recommend vaccines for you based on your age, medical history, and lifestyle or other factors, such as travel or where you work. What tests do I need? Screening Your health care provider may recommend screening tests for certain conditions. This may include: Lipid and cholesterol levels. Diabetes screening. This is done by checking your blood sugar (glucose) after you have not eaten for a while (fasting). Pelvic exam and Pap test. Hepatitis B test. Hepatitis C  test. HIV (human immunodeficiency virus) test. STI (sexually transmitted infection) testing, if you are at risk. Lung cancer screening. Colorectal cancer screening. Mammogram. Talk with your health care provider about when you should start having regular mammograms. This may depend on whether you have a family history of breast cancer. BRCA-related cancer screening. This may be done if you have a family history of breast, ovarian, tubal, or peritoneal cancers. Bone density scan. This is done to screen for osteoporosis. Talk with your health care provider about your test results, treatment options, and if necessary, the need for more tests. Follow these instructions at home: Eating and drinking  Eat a diet that includes fresh fruits and vegetables, whole grains, lean protein, and low-fat dairy products. Take vitamin and mineral supplements as recommended by your health care provider. Do not drink alcohol if: Your health care provider tells you not to drink. You are pregnant, may be pregnant, or are planning to become pregnant. If you drink alcohol: Limit how much you have to 0-1 drink a day. Know how much alcohol is in your drink. In the [place] one drink equals one 12 oz bottle of beer (355 mL), one 5 oz glass of wine (148 mL), or one 1 oz glass of hard liquor (44 mL). Lifestyle Brush your teeth every morning and night with fluoride toothpaste. Floss one time each day. Exercise for at least 30 minutes 5 or more days each week. Do not use any products that contain nicotine or tobacco. These products include cigarettes, chewing tobacco, and vaping devices, such as e-cigarettes. If you need help quitting, ask your health care provider. Do not use drugs. If you are sexually active, practice safe sex. Use a condom or other form of protection to   prevent STIs. If you do not wish to become pregnant, use a form of birth control. If you plan to become pregnant, see your health care provider for a  prepregnancy visit. Take aspirin only as told by your health care provider. Make sure that you understand how much to take and what form to take. Work with your health care provider to find out whether it is safe and beneficial for you to take aspirin daily. Find healthy ways to manage stress, such as: Meditation, yoga, or listening to music. Journaling. Talking to a trusted person. Spending time with friends and family. Minimize exposure to UV radiation to reduce your risk of skin cancer. Safety Always wear your seat belt while driving or riding in a vehicle. Do not drive: If you have been drinking alcohol. Do not ride with someone who has been drinking. When you are tired or distracted. While texting. If you have been using any mind-altering substances or drugs. Wear a helmet and other protective equipment during sports activities. If you have firearms in your house, make sure you follow all gun safety procedures. Seek help if you have been physically or sexually abused. What's next? Visit your health care provider once a year for an annual wellness visit. Ask your health care provider how often you should have your eyes and teeth checked. Stay up to date on all vaccines. This information is not intended to replace advice given to you by your health care provider. Make sure you discuss any questions you have with your health care provider. Document Revised: 07/31/2020 Document Reviewed: 07/31/2020 Elsevier Patient Education  2023 Elsevier Inc.  

## 2022-05-12 ENCOUNTER — Other Ambulatory Visit: Payer: Self-pay

## 2022-05-12 DIAGNOSIS — N939 Abnormal uterine and vaginal bleeding, unspecified: Secondary | ICD-10-CM

## 2022-05-12 MED ORDER — NORETHINDRONE ACET-ETHINYL EST 1.5-30 MG-MCG PO TABS: 1.0000 | ORAL_TABLET | Freq: Every day | ORAL | 4 refills | Status: DC

## 2022-06-03 ENCOUNTER — Telehealth: Payer: Self-pay | Admitting: Family Medicine

## 2022-06-03 ENCOUNTER — Ambulatory Visit (INDEPENDENT_AMBULATORY_CARE_PROVIDER_SITE_OTHER): Payer: BC Managed Care – PPO | Admitting: Obstetrics & Gynecology

## 2022-06-03 ENCOUNTER — Encounter: Payer: Self-pay | Admitting: Obstetrics & Gynecology

## 2022-06-03 VITALS — BP 179/97 | HR 76 | Ht 66.0 in | Wt 140.0 lb

## 2022-06-03 DIAGNOSIS — Z01419 Encounter for gynecological examination (general) (routine) without abnormal findings: Secondary | ICD-10-CM | POA: Diagnosis not present

## 2022-06-03 DIAGNOSIS — N951 Menopausal and female climacteric states: Secondary | ICD-10-CM

## 2022-06-03 DIAGNOSIS — N939 Abnormal uterine and vaginal bleeding, unspecified: Secondary | ICD-10-CM

## 2022-06-03 MED ORDER — NORETHINDRONE ACET-ETHINYL EST 1.5-30 MG-MCG PO TABS: 1.0000 | ORAL_TABLET | Freq: Every day | ORAL | 4 refills | Status: DC

## 2022-06-03 NOTE — Telephone Encounter (Signed)
Pt stated she saw her gynecologist today across the hall. When they checked her bp is was 178/98 with no sxs but they would like her to come to our office to get a bp check. Please advise once orders are in.

## 2022-06-03 NOTE — Progress Notes (Signed)
Subjective:     [name] [name] is a 47 y.o. female here for a routine exam.  Current complaints: Pt reports hot flushes and heavy menses.      Gynecologic History No LMP recorded. (Menstrual status: Oral contraceptives). Contraception: OCP (estrogen/progesterone) Last Pap: 03/19/2021. Results were: normal Last mammogram: 05/04/2022. Results were: normal  Obstetric History OB History  Gravida Para Term Preterm AB Living  2 2 2     2   SAB IAB Ectopic Multiple Live Births          2    # Outcome Date GA Lbr Len/2nd Weight Sex Delivery Anes PTL Lv  2 Term 2008 [redacted]w[redacted]d   F Vag-Spont EPI N LIV  1 Term 2006 [redacted]w[redacted]d   F CS-LTranv EPI N LIV   The following portions of the patient's history were reviewed and updated as appropriate: allergies, current medications, past family history, past medical history, past social history, past surgical history, and problem list.  Review of Systems Pertinent items are noted in HPI.    Objective:  BP (!) 150/100   Pulse 73   Ht 5\' 6"  (1.676 m)   Wt 140 lb (63.5 kg)   BMI 22.60 kg/m   General Appearance:    Alert, cooperative, no distress, appears stated age  Head:    Normocephalic, without obvious abnormality, atraumatic  Eyes:    conjunctiva/corneas clear, EOM's intact, both eyes  Ears:    Normal external ear canals, both ears  Nose:   Nares normal, septum midline, mucosa normal, no drainage    or sinus tenderness  Throat:   Lips, mucosa, and tongue normal; teeth and gums normal  Neck:   Supple, symmetrical, trachea midline, no adenopathy;    thyroid:  no enlargement/tenderness/nodules  Back:     Symmetric, no curvature, ROM normal, no CVA tenderness  Lungs:     respirations unlabored  Chest Wall:    No tenderness or deformity   Heart:    Regular rate and rhythm  Breast Exam:    No tenderness, masses, or nipple abnormality  Abdomen:     Soft, non-tender, bowel sounds active all four quadrants,    no masses, no organomegaly  Genitalia:    Normal  female without lesion, discharge or tenderness     Extremities:   Extremities normal, atraumatic, no cyanosis or edema  Pulses:   2+ and symmetric all extremities  Skin:   Skin color, texture, turgor normal, no rashes or lesions    Assessment:    Healthy female exam.    Plan:  Diagnoses and all orders for this visit:  Well female exam with routine gynecological exam  Hot flushes, perimenopausal  Abnormal uterine bleeding (AUB) -     Norethindrone Acetate-Ethinyl Estradiol (LOESTRIN 1.5/30, 21,) 1.5-30 MG-MCG tablet; Take 1 tablet by mouth daily.   F/u in 1 year or sooner prn  Jiyan Walkowski L. Harraway-Smith, M.D., [facility]

## 2022-06-04 ENCOUNTER — Ambulatory Visit (INDEPENDENT_AMBULATORY_CARE_PROVIDER_SITE_OTHER): Payer: BC Managed Care – PPO | Admitting: Family Medicine

## 2022-06-04 VITALS — BP 148/100 | HR 56 | Ht 66.0 in | Wt 140.2 lb

## 2022-06-04 DIAGNOSIS — I10 Essential (primary) hypertension: Secondary | ICD-10-CM

## 2022-06-04 HISTORY — DX: Essential (primary) hypertension: I10

## 2022-06-04 MED ORDER — LOSARTAN POTASSIUM 50 MG PO TABS: 50.0000 mg | ORAL_TABLET | Freq: Every day | ORAL | 3 refills | Status: DC

## 2022-06-04 NOTE — Telephone Encounter (Signed)
Patient scheduled for today at 3:40pm

## 2022-06-04 NOTE — Progress Notes (Signed)
Subjective:   By signing my name below, I, [name] [name], attest that this documentation has been prepared under the direction and in the presence of [name] Schultz, DO. 06/04/2022   Patient ID: [name] [name], female    DOB: 03-15-1975, 47 y.o.   MRN: [id number]  Chief Complaint  Patient presents with   Hypertension    HPI Patient is in today for  office visit.   She reports her blood pressure is elevated at home. Her blood pressure is elevated during this visit. Her blood pressure was elevated during her GYN visit yesterday. She has cut out red meat and is avoiding salt in her diet, otherwise she has no new changes. She denies having chest pain, leg swelling, shortness of breathe, heart palpitations.  BP Readings from Last 3 Encounters:  06/04/22 (!) 148/100  06/03/22 (!) 179/97  05/07/22 110/80   Pulse Readings from Last 3 Encounters:  06/04/22 (!) 56  06/03/22 76  05/07/22 60    Past Medical History:  Diagnosis Date   Allergy    Basal cell carcinoma    abdomen   Celiac disease 2009   Functional dyspepsia 09/05/2012   Gastritis    GERD (gastroesophageal reflux disease) 08/08/2012   Guillain Barr syndrome 04/01/2007   After influenza vaccine   Hyperthyroidism 04/01/2007   Hypoglycemia, unspecified 10/26/2007    Past Surgical History:  Procedure Laterality Date   CESAREAN SECTION     COLONOSCOPY  05/12/2007   Leone Payor   ESOPHAGOGASTRODUODENOSCOPY     SKIN CANCER EXCISION     abdomin   WISDOM TOOTH EXTRACTION      Family History  Problem Relation Age of Onset   Leukemia Mother 40   Hypertension Mother    Stroke Mother    Memory loss Mother    Hypertension Father    Hyperlipidemia Father    Diabetes Father    Prostate cancer Father 37   Depression Father    Hypertension Sister    Bipolar disorder Sister    Stroke Maternal Grandmother    Multiple sclerosis Cousin    Lupus Cousin    Hypertension Other        siblings   Autoimmune disease  Other        numerous family members on her mother's side   Colon cancer Neg Hx    Esophageal cancer Neg Hx    Stomach cancer Neg Hx    Rectal cancer Neg Hx    Colon polyps Neg Hx     Social History   Socioeconomic History   Marital status: Married    Spouse name: [name] [name]   Number of children: 2   Years of education: 16   Highest education level: Bachelor's degree (e.g., BA, AB, BS)  Occupational History   Occupation: Social worker: [employer]  Tobacco Use   Smoking status: Former    Types: Cigarettes    Quit date: 01/19/1998    Years since quitting: 24.3   Smokeless tobacco: Never   Tobacco comments:    About age 41   Vaping Use   Vaping Use: Never used  Substance and Sexual Activity   Alcohol use: Yes    Alcohol/week: 4.0 - 8.0 standard drinks of alcohol    Types: 4 - 8 Glasses of wine per week   Drug use: Yes    Comment: CBD   Sexual activity: Yes    Partners: Male  Other Topics Concern   Not  on file  Social History Narrative   Married with 2 children   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 [place]   Occasional alcohol no drugs or tobacco use at this time   Exercise-- 2 x a week    R handed   Caffeine: a C of tea and Coffee a day   Social Determinants of Health   Financial Resource Strain: Low Risk  (06/04/2022)   Overall Financial Resource Strain (CARDIA)    Difficulty of Paying Living Expenses: Not very hard  Food Insecurity: No Food Insecurity (06/04/2022)   Hunger Vital Sign    Worried About Running Out of Food in the Last Year: Never true    Ran Out of Food in the Last Year: Never true  Transportation Needs: No Transportation Needs (06/04/2022)   PRAPARE - Administrator, Civil Service (Medical): No    Lack of Transportation (Non-Medical): No  Physical Activity: Insufficiently Active (06/04/2022)   Exercise Vital Sign    Days of Exercise per Week: 2 days    Minutes of Exercise per Session: 30 min   Stress: Stress Concern Present (06/04/2022)   [employer] of Occupational Health - Occupational Stress Questionnaire    Feeling of Stress : To some extent  Social Connections: Socially Isolated (06/04/2022)   Social Connection and Isolation Panel [NHANES]    Frequency of Communication with Friends and Family: Never    Frequency of Social Gatherings with Friends and Family: Twice a week    Attends Religious Services: Never    Database administrator or Organizations: No    Attends Engineer, structural: Not on file    Marital Status: Married  Catering manager Violence: Not on file    Outpatient Medications Prior to Visit  Medication Sig Dispense Refill   clindamycin (CLINDAGEL) 1 % gel Apply 1 Application topically 2 (two) times daily.     Ferrous Sulfate (IRON) 325 (65 FE) MG TABS Take 65 mg by mouth daily.     glucosamine-chondroitin 500-400 MG tablet Take 1 tablet by mouth 3 (three) times daily.     imiquimod (ALDARA) 5 % cream Apply topically.     NON FORMULARY at bedtime. CBD     Norethindrone Acetate-Ethinyl Estradiol (LOESTRIN 1.5/30, 21,) 1.5-30 MG-MCG tablet Take 1 tablet by mouth daily. 84 tablet 4   Probiotic Product (PROBIOTIC ADVANCED PO) Take by mouth daily.     tretinoin (RETIN-A) 0.05 % cream Apply topically at bedtime. 45 g 0   No facility-administered medications prior to visit.    Allergies  Allergen Reactions   Gluten Meal Other (See Comments)   Influenza Vac Split Quad     REACTION: pt had Drema Balzarine--- can not have flu shot    Review of Systems  Constitutional:  Negative for fever and malaise/fatigue.  HENT:  Negative for congestion.   Eyes:  Negative for blurred vision.  Respiratory:  Negative for shortness of breath.   Cardiovascular:  Negative for chest pain, palpitations and leg swelling.  Gastrointestinal:  Negative for abdominal pain, blood in stool and nausea.  Genitourinary:  Negative for dysuria and frequency.  Musculoskeletal:   Negative for falls.  Skin:  Negative for rash.  Neurological:  Negative for dizziness, loss of consciousness and headaches.  Endo/Heme/Allergies:  Negative for environmental allergies.  Psychiatric/Behavioral:  Negative for depression. The patient is not nervous/anxious.        Objective:    Physical Exam Vitals and nursing note reviewed.  Constitutional:      General: She is not in acute distress.    Appearance: Normal appearance. She is well-developed. She is not ill-appearing.  HENT:     Head: Normocephalic and atraumatic.     Right Ear: External ear normal.     Left Ear: External ear normal.  Eyes:     Extraocular Movements: Extraocular movements intact.     Conjunctiva/sclera: Conjunctivae normal.     Pupils: Pupils are equal, round, and reactive to light.  Neck:     Thyroid: No thyromegaly.     Vascular: No carotid bruit or JVD.  Cardiovascular:     Rate and Rhythm: Normal rate and regular rhythm.     Heart sounds: Normal heart sounds. No murmur heard.    No gallop.  Pulmonary:     Effort: Pulmonary effort is normal. No respiratory distress.     Breath sounds: Normal breath sounds. No wheezing or rales.  Chest:     Chest wall: No tenderness.  Musculoskeletal:     Cervical back: Normal range of motion and neck supple.  Skin:    General: Skin is warm and dry.  Neurological:     Mental Status: She is alert and oriented to person, place, and time.  Psychiatric:        Judgment: Judgment normal.     BP (!) 148/100 (BP Location: Left Arm, Patient Position: Sitting, Cuff Size: Normal)   Pulse (!) 56   Ht  (1.676 m)   Wt 140 lb 3.2 oz (63.6 kg)   SpO2 98%   BMI 22.63 kg/m  Wt Readings from Last 3 Encounters:  06/04/22 140 lb 3.2 oz (63.6 kg)  06/03/22 140 lb (63.5 kg)  05/07/22 141 lb 3.2 oz (64 kg)       Assessment & Plan:  Primary hypertension Assessment & Plan: Poorly controlled will alter medications, encouraged DASH diet, minimize caffeine and  obtain adequate sleep. Report concerning symptoms and follow up as directed and as needed  Start losartan 50 mg daily and f/u 2-3 weeks   Orders: -     Losartan Potassium; Take 1 tablet (50 mg total) by mouth daily.  Dispense: 30 tablet; Refill: 3    I, [name] Schultz, DO, personally preformed the services described in this documentation.  All medical record entries made by the scribe were at my direction and in my presence.  I have reviewed the chart and discharge instructions (if applicable) and agree that the record reflects my personal performance and is accurate and complete. 06/04/2022   I,[name] [name],acting as a scribe for [name] Schultz, DO.,have documented all relevant documentation on the behalf of [name] Schultz, DO,as directed by  [name] Schultz, DO while in the presence of [name] Schultz, DO.   [name] Schultz, DO

## 2022-06-04 NOTE — Assessment & Plan Note (Signed)
Poorly controlled will alter medications, encouraged DASH diet, minimize caffeine and obtain adequate sleep. Report concerning symptoms and follow up as directed and as needed  Start losartan 50 mg daily and f/u 2-3 weeks

## 2022-06-09 DIAGNOSIS — M9905 Segmental and somatic dysfunction of pelvic region: Secondary | ICD-10-CM | POA: Diagnosis not present

## 2022-06-09 DIAGNOSIS — M7711 Lateral epicondylitis, right elbow: Secondary | ICD-10-CM | POA: Diagnosis not present

## 2022-06-09 DIAGNOSIS — M9903 Segmental and somatic dysfunction of lumbar region: Secondary | ICD-10-CM | POA: Diagnosis not present

## 2022-06-09 DIAGNOSIS — M9902 Segmental and somatic dysfunction of thoracic region: Secondary | ICD-10-CM | POA: Diagnosis not present

## 2022-06-16 DIAGNOSIS — M9903 Segmental and somatic dysfunction of lumbar region: Secondary | ICD-10-CM | POA: Diagnosis not present

## 2022-06-16 DIAGNOSIS — M7711 Lateral epicondylitis, right elbow: Secondary | ICD-10-CM | POA: Diagnosis not present

## 2022-06-16 DIAGNOSIS — M9905 Segmental and somatic dysfunction of pelvic region: Secondary | ICD-10-CM | POA: Diagnosis not present

## 2022-06-16 DIAGNOSIS — M9902 Segmental and somatic dysfunction of thoracic region: Secondary | ICD-10-CM | POA: Diagnosis not present

## 2022-06-25 ENCOUNTER — Ambulatory Visit (HOSPITAL_BASED_OUTPATIENT_CLINIC_OR_DEPARTMENT_OTHER)
Admission: RE | Admit: 2022-06-25 | Discharge: 2022-06-25 | Disposition: A | Discharge status: Home / Self Care | Payer: BC Managed Care – PPO | Source: Ambulatory Visit | Attending: Family Medicine | Admitting: Family Medicine

## 2022-06-25 ENCOUNTER — Encounter: Payer: Self-pay | Admitting: Family Medicine

## 2022-06-25 ENCOUNTER — Ambulatory Visit: Payer: BC Managed Care – PPO | Admitting: Family Medicine

## 2022-06-25 VITALS — BP 118/80 | HR 73 | Temp 98.3°F | Resp 18 | Ht 66.0 in | Wt 139.0 lb

## 2022-06-25 DIAGNOSIS — M25552 Pain in left hip: Secondary | ICD-10-CM

## 2022-06-25 DIAGNOSIS — G8929 Other chronic pain: Secondary | ICD-10-CM

## 2022-06-25 DIAGNOSIS — I10 Essential (primary) hypertension: Secondary | ICD-10-CM

## 2022-06-25 DIAGNOSIS — M5441 Lumbago with sciatica, right side: Secondary | ICD-10-CM

## 2022-06-25 DIAGNOSIS — M5136 Other intervertebral disc degeneration, lumbar region: Secondary | ICD-10-CM | POA: Diagnosis not present

## 2022-06-25 DIAGNOSIS — M25551 Pain in right hip: Secondary | ICD-10-CM

## 2022-06-25 DIAGNOSIS — M5442 Lumbago with sciatica, left side: Secondary | ICD-10-CM | POA: Insufficient documentation

## 2022-06-25 LAB — COMPREHENSIVE METABOLIC PANEL
ALT: 15 U/L (ref 0–35)
AST: 17 U/L (ref 0–37)
Albumin: 4.1 g/dL (ref 3.5–5.2)
Alkaline Phosphatase: 39 U/L (ref 39–117)
BUN: 12 mg/dL (ref 6–23)
CO2: 27 mEq/L (ref 19–32)
Calcium: 9.6 mg/dL (ref 8.4–10.5)
Chloride: 103 mEq/L (ref 96–112)
Creatinine, Ser: 0.82 mg/dL (ref 0.40–1.20)
GFR: 86.04 mL/min (ref >60.00)
Glucose, Bld: 85 mg/dL (ref 70–99)
Potassium: 4.6 mEq/L (ref 3.5–5.1)
Sodium: 139 mEq/L (ref 135–145)
Total Bilirubin: 0.5 mg/dL (ref 0.2–1.2)
Total Protein: 6.9 g/dL (ref 6.0–8.3)

## 2022-06-25 MED ORDER — LOSARTAN POTASSIUM 50 MG PO TABS: 50.0000 mg | ORAL_TABLET | Freq: Every day | ORAL | 1 refills | Status: DC

## 2022-06-25 NOTE — Progress Notes (Addendum)
Subjective:   By signing my name below, I, [name] [name], attest that this documentation has been prepared under the direction and in the presence of [name] Schultz, DO. 06/25/2022   Patient ID: [name] [name], female    DOB: [birth year], 47 y.o.   MRN: [id number]  Chief Complaint  Patient presents with   Hypertension    Pt states checking blood pressures at home and have been around 140/80   Follow-up    Hypertension   Patient is in today for a follow up visit.   She complains of lower back pain that radiates to her groin and upper thighs. Her symptoms started after she pulled a muscle in her back from coughing within the past year. She is seeing a chiropractor to manage her symptoms and finds mild relief while seeing them. She has weakness in her back.  Her blood pressure is doing well during this visit. She was started on 50 mg losartan daily PO during her last visit and reports feeling lightheaded while taking it but found her symptoms improved the longer she took it. She continues taking aleve daily and reports no new issues while taking it. She denies swelling in her ankles.  BP Readings from Last 3 Encounters:  06/25/22 118/80  06/04/22 (!) 148/100  06/03/22 (!) 179/97   Pulse Readings from Last 3 Encounters:  06/25/22 73  06/04/22 (!) 56  06/03/22 76    Past Medical History:  Diagnosis Date   Allergy    Basal cell carcinoma    abdomen   Celiac disease 2009   Functional dyspepsia 09/05/2012   Gastritis    GERD (gastroesophageal reflux disease) 08/08/2012   Guillain Barr syndrome 04/01/2007   After influenza vaccine   Hyperthyroidism 04/01/2007   Hypoglycemia, unspecified 10/26/2007    Past Surgical History:  Procedure Laterality Date   CESAREAN SECTION     COLONOSCOPY  05/12/2007   Leone Payor   ESOPHAGOGASTRODUODENOSCOPY     SKIN CANCER EXCISION     abdomin   WISDOM TOOTH EXTRACTION      Family History  Problem Relation Age of Onset   Leukemia  Mother 51   Hypertension Mother    Stroke Mother    Memory loss Mother    Hypertension Father    Hyperlipidemia Father    Diabetes Father    Prostate cancer Father 25   Depression Father    Hypertension Sister    Bipolar disorder Sister    Stroke Maternal Grandmother    Multiple sclerosis Cousin    Lupus Cousin    Hypertension Other        siblings   Autoimmune disease Other        numerous family members on her mother's side   Colon cancer Neg Hx    Esophageal cancer Neg Hx    Stomach cancer Neg Hx    Rectal cancer Neg Hx    Colon polyps Neg Hx     Social History   Socioeconomic History   Marital status: Married    Spouse name: [name] [name]   Number of children: 2   Years of education: 16   Highest education level: Bachelor's degree (e.g., BA, AB, BS)  Occupational History   Occupation: Holiday representative    Employer: [employer]  Tobacco Use   Smoking status: Former    Types: Cigarettes    Quit date: 01/19/1998    Years since quitting: 24.4   Smokeless tobacco: Never   Tobacco comments:  About age 6   Vaping Use   Vaping Use: Never used  Substance and Sexual Activity   Alcohol use: Yes    Alcohol/week: 4.0 - 8.0 standard drinks of alcohol    Types: 4 - 8 Glasses of wine per week   Drug use: Yes    Comment: CBD   Sexual activity: Yes    Partners: Male  Other Topics Concern   Not on file  Social History Narrative   Married with 2 children   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 [place]   Occasional alcohol no drugs or tobacco use at this time   Exercise-- 2 x a week    R handed   Caffeine: a C of tea and Coffee a day   Social Determinants of Health   Financial Resource Strain: Low Risk  (06/04/2022)   Overall Financial Resource Strain (CARDIA)    Difficulty of Paying Living Expenses: Not very hard  Food Insecurity: No Food Insecurity (06/04/2022)   Hunger Vital Sign    Worried About Running Out of Food in the Last Year: Never true     Ran Out of Food in the Last Year: Never true  Transportation Needs: No Transportation Needs (06/04/2022)   PRAPARE - Administrator, Civil Service (Medical): No    Lack of Transportation (Non-Medical): No  Physical Activity: Insufficiently Active (06/04/2022)   Exercise Vital Sign    Days of Exercise per Week: 2 days    Minutes of Exercise per Session: 30 min  Stress: Stress Concern Present (06/04/2022)   [employer] of Occupational Health - Occupational Stress Questionnaire    Feeling of Stress : To some extent  Social Connections: Socially Isolated (06/04/2022)   Social Connection and Isolation Panel [NHANES]    Frequency of Communication with Friends and Family: Never    Frequency of Social Gatherings with Friends and Family: Twice a week    Attends Religious Services: Never    Database administrator or Organizations: No    Attends Engineer, structural: Not on file    Marital Status: Married  Catering manager Violence: Not on file    Outpatient Medications Prior to Visit  Medication Sig Dispense Refill   clindamycin (CLINDAGEL) 1 % gel Apply 1 Application topically 2 (two) times daily.     Ferrous Sulfate (IRON) 325 (65 FE) MG TABS Take 65 mg by mouth daily.     glucosamine-chondroitin 500-400 MG tablet Take 1 tablet by mouth 3 (three) times daily.     imiquimod (ALDARA) 5 % cream Apply topically.     NON FORMULARY at bedtime. CBD     Norethindrone Acetate-Ethinyl Estradiol (LOESTRIN 1.5/30, 21,) 1.5-30 MG-MCG tablet Take 1 tablet by mouth daily. 84 tablet 4   Probiotic Product (PROBIOTIC ADVANCED PO) Take by mouth daily.     tretinoin (RETIN-A) 0.05 % cream Apply topically at bedtime. 45 g 0   losartan (COZAAR) 50 MG tablet Take 1 tablet (50 mg total) by mouth daily. 30 tablet 3   No facility-administered medications prior to visit.    Allergies  Allergen Reactions   Gluten Meal Other (See Comments)   Influenza Vac Split Quad     REACTION: pt  had Drema Balzarine--- can not have flu shot    Review of Systems  Cardiovascular:  Negative for leg swelling.  Musculoskeletal:        (+)bilateral groin pain (+)bilateral upper thigh pain (+)lower back pain  Neurological:  Positive for weakness (lower back).       Objective:    Physical Exam Constitutional:      General: She is not in acute distress.    Appearance: Normal appearance. She is not ill-appearing.  HENT:     Head: Normocephalic and atraumatic.     Right Ear: External ear normal.     Left Ear: External ear normal.  Eyes:     Extraocular Movements: Extraocular movements intact.     Pupils: Pupils are equal, round, and reactive to light.  Cardiovascular:     Rate and Rhythm: Normal rate and regular rhythm.     Heart sounds: Normal heart sounds. No murmur heard.    No gallop.  Pulmonary:     Effort: Pulmonary effort is normal. No respiratory distress.     Breath sounds: Normal breath sounds. No wheezing or rales.  Musculoskeletal:     Comments: 5/5 strength in both ankles 5/5 strength in lower extremities   Skin:    General: Skin is warm and dry.  Neurological:     Mental Status: She is alert and oriented to person, place, and time.  Psychiatric:        Judgment: Judgment normal.     BP 118/80 (BP Location: Right Arm, Patient Position: Sitting, Cuff Size: Normal)   Pulse 73   Temp 98.3 F (36.8 C) (Oral)   Resp 18   Ht 5\' 6"  (1.676 m)   Wt 139 lb (63 kg)   SpO2 97%   BMI 22.44 kg/m  Wt Readings from Last 3 Encounters:  06/25/22 139 lb (63 kg)  06/04/22 140 lb 3.2 oz (63.6 kg)  06/03/22 140 lb (63.5 kg)       Assessment & Plan:  Primary hypertension Assessment & Plan: .Well controlled, no changes to meds. Encouraged heart healthy diet such as the DASH diet and exercise as tolerated.    Orders: -     Comprehensive metabolic panel -     Losartan Potassium; Take 1 tablet (50 mg total) by mouth daily.  Dispense: 90 tablet; Refill: 1  Acute  midline low back pain with bilateral sciatica -     DG Lumbar Spine Complete; Future  Bilateral hip pain -     DG HIPS BILAT W OR W/O PELVIS 2V; Future -     Ambulatory referral to Physical Therapy  Chronic bilateral low back pain with bilateral sciatica -     Ambulatory referral to Physical Therapy    I, [name] Schultz, DO, personally preformed the services described in this documentation.  All medical record entries made by the scribe were at my direction and in my presence.  I have reviewed the chart and discharge instructions (if applicable) and agree that the record reflects my personal performance and is accurate and complete. 06/25/2022   I,[name] [name],acting as a scribe for [name] Schultz, DO.,have documented all relevant documentation on the behalf of [name] Schultz, DO,as directed by  [name] Schultz, DO while in the presence of [name] Schultz, DO.   [name] Schultz, DO

## 2022-06-25 NOTE — Assessment & Plan Note (Signed)
Well controlled, no changes to meds. Encouraged heart healthy diet such as the DASH diet and exercise as tolerated.  °

## 2022-07-02 NOTE — Therapy (Signed)
OUTPATIENT PHYSICAL THERAPY LOWER EXTREMITY EVALUATION   Patient Name: [name] [name] MRN: [id number] DOB:1975/04/11, 47 y.o., female Today's Date: 07/07/2022  END OF SESSION:  PT End of Session - 07/07/22 1104     Visit Number 1    Date for PT Re-Evaluation 08/18/22    PT Start Time 1104    PT Stop Time 1149    PT Time Calculation (min) 45 min    Activity Tolerance Patient tolerated treatment well    Behavior During Therapy [facility] for tasks assessed/performed             Past Medical History:  Diagnosis Date   Allergy    Basal cell carcinoma    abdomen   Celiac disease 2009   Functional dyspepsia 09/05/2012   Gastritis    GERD (gastroesophageal reflux disease) 08/08/2012   Guillain Barr syndrome 04/01/2007   After influenza vaccine   Hyperthyroidism 04/01/2007   Hypoglycemia, unspecified 10/26/2007   Past Surgical History:  Procedure Laterality Date   CESAREAN SECTION     COLONOSCOPY  05/12/2007   Leone Payor   ESOPHAGOGASTRODUODENOSCOPY     SKIN CANCER EXCISION     abdomin   WISDOM TOOTH EXTRACTION     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   Fatigue 08/20/2021   Functional dyspepsia 09/05/2012   GERD (gastroesophageal reflux disease) 08/08/2012   Acne 08/08/2012   Hypoglycemia, unspecified 10/26/2007   Celiac disease 10/26/2007   Hyperthyroidism 04/01/2007   Guillain Barr syndrome 04/01/2007    PCP: [name], DO   REFERRING PROVIDER: [name], *   REFERRING DIAG:  [phone] (ICD-10-CM) - Bilateral hip pain  M54.42,M54.41,G89.29 (ICD-10-CM) - Chronic bilateral low back pain with bilateral sciatica    THERAPY DIAG:  Other low back pain  Muscle weakness (generalized)  Cramp  and spasm  Rationale for Evaluation and Treatment: Rehabilitation  ONSET DATE: 6-8 weeks ago  SUBJECTIVE:   SUBJECTIVE STATEMENT: Used to just be LBP. Progression to hips has been in the last year or two.This seems different because it is continuous. Sees chiropractor weekly, doing core exercises. Also had episode of back pain/discomfort from Nov to March. Pain limits sitting to 10-15 min.    PERTINENT HISTORY: LBP since 47 yo, Csection, GBS when she was in her 53's - mild nerve damage PAIN:  Are you having pain? Yes: NPRS scale: 3-5/10 Pain location: low back L>R into bil groin Pain description: constant ache Aggravating factors: prolonged sitting, pain in the morning with bed mobility Relieving factors: lying on back  PRECAUTIONS: None  WEIGHT BEARING RESTRICTIONS: No  FALLS:  Has patient fallen in last 6 months? No  LIVING ENVIRONMENT: Lives with: lives with their spouse Lives in: House/apartment Stairs:  no issue with stairs Has following equipment at home: None  OCCUPATION: desk job, works at home  PLOF: Independent  PATIENT GOALS: Get back to a state of comfort  NEXT MD VISIT: 6 months  OBJECTIVE:   DIAGNOSTIC FINDINGS:  DG LUMBAR: Normal anatomic alignment. No acute fracture or dislocation. L5-S1 degenerative disc disease. T11-12 degenerative disc disease. L4-5 and L5-S1 facet degenerative changes. SI joints unremarkable.  DG BIL HIPS - negative  PATIENT SURVEYS:  FOTO 57 (goal 67)  COGNITION: Overall cognitive status: Within functional limits  for tasks assessed     SENSATION: WFL  MUSCLE LENGTH: Hip Flexors:bil   POSTURE: decreased lumbar lordosis  PALPATION: Bil glute med/min  LOWER EXTREMITY ROM: WNL bil   LOWER EXTREMITY MMT: 5/5 Bil, significant core weakness when testing hip flexors, but patient able to perform advanced planks and side-planks.  LOWER EXTREMITY SPECIAL TESTS:  Hip special tests: Luisa Hart (FABER) test:  negative    TODAY'S TREATMENT:                                                                                                                              DATE:   07/07/22 See pt ed and HEP  PATIENT EDUCATION:  Education details: PT eval findings, anticipated POC, initial HEP, and role of DN  Person educated: Patient Education method: Explanation, Demonstration, and Handouts Education comprehension: verbalized understanding and returned demonstration  HOME EXERCISE PROGRAM: Access Code: [email or web address] URL: [email or web address] Date: 07/07/2022 Prepared by: Raynelle Fanning  Exercises - Supine Quadriceps Stretch with Strap on Table  - 2 x daily - 7 x weekly - 1 sets - 3 reps - 30-60 sec hold - Seated Hip Flexor Stretch  - 2 x daily - 7 x weekly - 1 sets - 3 reps - 30-60 sec hold - Active Hip Flexor Stretch  - 2 x daily - 7 x weekly - 1 sets - 10 reps - Primal Push Up  - 1 x daily - 3-4 x weekly - 1 sets - 10 reps - max hold  Patient Education - Trigger Point Dry Needling  ASSESSMENT:  CLINICAL IMPRESSION: [name] [name] is a 47 y.o. female who was seen today for physical therapy evaluation and treatment for low back and bil hip pain beginning 6-8 weeks ago. She has a 20 year h/o LBP, but states this episode is different because it is constant. Her pain refers into bil groins. Pain limits her ability with sit to stand transfers (antalgic), bed mobility and sitting. It affects all ADLS due to pain being constant. She has full ROM and 5/5 LE strength, but demonstrates core weakness with MMT. She also has increased muscle tension in bil gluteals and tight hip flexors. She will benefit from skilled PT to address these deficits.  OBJECTIVE IMPAIRMENTS: decreased activity tolerance, decreased strength, increased muscle spasms, impaired flexibility, and pain.   ACTIVITY LIMITATIONS: sitting, sleeping, transfers, and bed mobility  PARTICIPATION LIMITATIONS:  all ADLS  affected  PERSONAL FACTORS: Time since onset of injury/illness/exacerbation and 1 comorbidity: past GBS  are also affecting patient's functional outcome.   REHAB POTENTIAL: Excellent  CLINICAL DECISION MAKING: Stable/uncomplicated  EVALUATION COMPLEXITY: Low   GOALS: Goals reviewed with patient? Yes  SHORT TERM GOALS: Target date: 07/21/2022   Patient will be independent with initial HEP.  Baseline:  Goal status: INITIAL  2.  Patient will report centralization of radicular symptoms by 07/28/22.  Baseline:    LONG TERM GOALS: Target date: 08/18/2022   Patient will be  independent with advanced/ongoing HEP to improve outcomes and carryover.  Baseline:  Goal status: INITIAL  2.  Patient will report 75% improvement in low back pain with sitting, tranfers and bed mobility to improve QOL.  Baseline:  Goal status: INITIAL  3.  Patient will report 56 on lumbar FOTO to demonstrate improved functional ability.  Baseline: 57 Goal status: INITIAL   4.  Patient will demonstrate improved core strength by ability to stabilze with MMT of hip flexors Baseline:  Goal status: INITIAL   PLAN:  PT FREQUENCY: 2x/week  PT DURATION: 6 weeks  PLANNED INTERVENTIONS: Therapeutic exercises, Therapeutic activity, Neuromuscular re-education, Patient/Family education, Self Care, Joint mobilization, Dry Needling, Electrical stimulation, Spinal mobilization, Cryotherapy, Moist heat, Taping, Traction, and Manual therapy  PLAN FOR NEXT SESSION: DN to gluteals, high level standing core, 1/2 kneel, asymmetric core strength; possible traction?   Rodgerick Gilliand, PT 07/07/2022, 2:01 PM

## 2022-07-07 ENCOUNTER — Other Ambulatory Visit: Payer: Self-pay

## 2022-07-07 ENCOUNTER — Ambulatory Visit: Payer: BC Managed Care – PPO | Attending: Family Medicine | Admitting: Physical Therapy

## 2022-07-07 ENCOUNTER — Encounter: Payer: Self-pay | Admitting: Physical Therapy

## 2022-07-07 DIAGNOSIS — M5459 Other low back pain: Secondary | ICD-10-CM | POA: Diagnosis not present

## 2022-07-07 DIAGNOSIS — G8929 Other chronic pain: Secondary | ICD-10-CM | POA: Diagnosis not present

## 2022-07-07 DIAGNOSIS — M25552 Pain in left hip: Secondary | ICD-10-CM | POA: Insufficient documentation

## 2022-07-07 DIAGNOSIS — R252 Cramp and spasm: Secondary | ICD-10-CM | POA: Diagnosis not present

## 2022-07-07 DIAGNOSIS — M6281 Muscle weakness (generalized): Secondary | ICD-10-CM | POA: Insufficient documentation

## 2022-07-07 DIAGNOSIS — M25551 Pain in right hip: Secondary | ICD-10-CM | POA: Insufficient documentation

## 2022-07-07 DIAGNOSIS — M5442 Lumbago with sciatica, left side: Secondary | ICD-10-CM | POA: Insufficient documentation

## 2022-07-07 DIAGNOSIS — M5441 Lumbago with sciatica, right side: Secondary | ICD-10-CM | POA: Insufficient documentation

## 2022-07-15 ENCOUNTER — Encounter: Payer: Self-pay | Admitting: Physical Therapy

## 2022-07-15 ENCOUNTER — Ambulatory Visit: Payer: BC Managed Care – PPO | Admitting: Physical Therapy

## 2022-07-15 DIAGNOSIS — M6281 Muscle weakness (generalized): Secondary | ICD-10-CM | POA: Diagnosis not present

## 2022-07-15 DIAGNOSIS — M5442 Lumbago with sciatica, left side: Secondary | ICD-10-CM | POA: Diagnosis not present

## 2022-07-15 DIAGNOSIS — M5441 Lumbago with sciatica, right side: Secondary | ICD-10-CM | POA: Diagnosis not present

## 2022-07-15 DIAGNOSIS — M5459 Other low back pain: Secondary | ICD-10-CM | POA: Diagnosis not present

## 2022-07-15 DIAGNOSIS — M25552 Pain in left hip: Secondary | ICD-10-CM | POA: Diagnosis not present

## 2022-07-15 DIAGNOSIS — R252 Cramp and spasm: Secondary | ICD-10-CM

## 2022-07-15 DIAGNOSIS — M25551 Pain in right hip: Secondary | ICD-10-CM | POA: Diagnosis not present

## 2022-07-15 DIAGNOSIS — G8929 Other chronic pain: Secondary | ICD-10-CM | POA: Diagnosis not present

## 2022-07-15 NOTE — Therapy (Signed)
OUTPATIENT PHYSICAL THERAPY LOWER EXTREMITY EVALUATION   Patient Name: [name] [name] MRN: [id number] DOB:July 22, 1975, 47 y.o., female Today's Date: 07/15/2022  END OF SESSION:  PT End of Session - 07/15/22 0933     Visit Number 2    Date for PT Re-Evaluation 08/18/22    PT Start Time 0933    PT Stop Time 1015    PT Time Calculation (min) 42 min    Activity Tolerance Patient tolerated treatment well    Behavior During Therapy [facility] for tasks assessed/performed              Past Medical History:  Diagnosis Date   Allergy    Basal cell carcinoma    abdomen   Celiac disease 2009   Functional dyspepsia 09/05/2012   Gastritis    GERD (gastroesophageal reflux disease) 08/08/2012   Guillain Barr syndrome 04/01/2007   After influenza vaccine   Hyperthyroidism 04/01/2007   Hypoglycemia, unspecified 10/26/2007   Past Surgical History:  Procedure Laterality Date   CESAREAN SECTION     COLONOSCOPY  05/12/2007   Leone Payor   ESOPHAGOGASTRODUODENOSCOPY     SKIN CANCER EXCISION     abdomin   WISDOM TOOTH EXTRACTION     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   Fatigue 08/20/2021   Functional dyspepsia 09/05/2012   GERD (gastroesophageal reflux disease) 08/08/2012   Acne 08/08/2012   Hypoglycemia, unspecified 10/26/2007   Celiac disease 10/26/2007   Hyperthyroidism 04/01/2007   Guillain Barr syndrome 04/01/2007    PCP: [name], DO   REFERRING PROVIDER: [name], *   REFERRING DIAG:  [phone] (ICD-10-CM) - Bilateral hip pain  M54.42,M54.41,G89.29 (ICD-10-CM) - Chronic bilateral low back pain with bilateral sciatica    THERAPY DIAG:  Other low back pain  Muscle weakness (generalized)  Cramp  and spasm  Rationale for Evaluation and Treatment: Rehabilitation  ONSET DATE: 6-8 weeks ago  SUBJECTIVE:   SUBJECTIVE STATEMENT: Pt states that she is about the same since her eval. She is having trouble with her HEP and staying on the bed.   PERTINENT HISTORY: LBP since 46 yo, Csection, GBS when she was in her 44's - mild nerve damage PAIN:  Are you having pain? Yes: NPRS scale: 2/10 Pain location: low back L>R into bil groin Pain description: constant ache Aggravating factors: prolonged sitting, pain in the morning with bed mobility Relieving factors: lying on back  PRECAUTIONS: None  WEIGHT BEARING RESTRICTIONS: No  FALLS:  Has patient fallen in last 6 months? No  LIVING ENVIRONMENT: Lives with: lives with their spouse Lives in: House/apartment Stairs:  no issue with stairs Has following equipment at home: None  OCCUPATION: desk job, works at home  PLOF: Independent  PATIENT GOALS: Get back to a state of comfort  NEXT MD VISIT: 6 months  OBJECTIVE:   DIAGNOSTIC FINDINGS:  DG LUMBAR: Normal anatomic alignment. No acute fracture or dislocation. L5-S1 degenerative disc disease. T11-12 degenerative disc disease. L4-5 and L5-S1 facet degenerative changes. SI joints unremarkable.  DG BIL HIPS - negative  PATIENT SURVEYS:  FOTO 57 (goal 67)  COGNITION: Overall cognitive status: Within functional limits for tasks assessed     SENSATION: WFL  MUSCLE LENGTH: Hip Flexors:bil   POSTURE: decreased lumbar lordosis  PALPATION: Bil glute med/min  LOWER EXTREMITY ROM: WNL bil   LOWER EXTREMITY MMT: 5/5 Bil, significant core weakness when testing hip flexors, but patient able to perform advanced planks and side-planks.  LOWER EXTREMITY SPECIAL TESTS:  Hip special tests: Luisa Hart (FABER) test: negative    TODAY'S TREATMENT:                                                                                                                              DATE:     07/15/22 Supine Rt hip flexor stretch off table x20 sec, set up instructions Plank demo 2x10 sec hold Prone hip extension x10 reps each Half kneel hold x20 sec each side Half kneel hold with diagonal chops attempted x5 reps, but pt having difficulty with maintaining proper alignment Side plank on elbows/knees x20 sec each, PT verbal/tactile cues to avoid trunk rotation compensation Kneeling adductor stretch gentle rocking front/back x30 sec each side   07/07/22 See pt ed and HEP  PATIENT EDUCATION:  Education details: PT eval findings, anticipated POC, initial HEP, and role of DN  Person educated: Patient Education method: Explanation, Demonstration, and Handouts Education comprehension: verbalized understanding and returned demonstration  HOME EXERCISE PROGRAM: Access Code: [email or web address] URL: [email or web address] Date: 07/07/2022 Prepared by: Raynelle Fanning  Exercises - Supine Quadriceps Stretch with Strap on Table  - 2 x daily - 7 x weekly - 1 sets - 3 reps - 30-60 sec hold - Seated Hip Flexor Stretch  - 2 x daily - 7 x weekly - 1 sets - 3 reps - 30-60 sec hold - Active Hip Flexor Stretch  - 2 x daily - 7 x weekly - 1 sets - 10 reps - Primal Push Up  - 1 x daily - 3-4 x weekly - 1 sets - 10 reps - max hold  Patient Education - Trigger Point Dry Needling  ASSESSMENT:  CLINICAL IMPRESSION: Pt has had minimal change in her pain since her evaluation, but this is expected at this time. PT addressed questions regarding HEP set up. Pt has difficulty with core exercises completed today. Half knee was difficult maintaining side to side stability with weight shifted primarily through her stance knee. Pt unable to attempt diagonals and required PT cuing to set up properly secondary to limitations in trunk strength and hip/trunk neuromuscular control. Pt reported that her hips felt good end of session. HEP was updated to reflect progressions in today's session.   OBJECTIVE  IMPAIRMENTS: decreased activity tolerance, decreased strength, increased muscle spasms, impaired flexibility, and pain.   ACTIVITY LIMITATIONS: sitting, sleeping, transfers, and bed mobility  PARTICIPATION LIMITATIONS:  all ADLS affected  PERSONAL FACTORS: Time since onset of injury/illness/exacerbation and 1 comorbidity: past GBS  are also affecting patient's functional outcome.   REHAB POTENTIAL: Excellent  CLINICAL DECISION MAKING: Stable/uncomplicated  EVALUATION COMPLEXITY: Low   GOALS: Goals reviewed with patient? Yes  SHORT TERM GOALS: Target date: 07/21/2022   Patient will be independent with  initial HEP.  Baseline:  Goal status: INITIAL  2.  Patient will report centralization of radicular symptoms by 07/28/22.  Baseline:    LONG TERM GOALS: Target date: 08/18/2022   Patient will be independent with advanced/ongoing HEP to improve outcomes and carryover.  Baseline:  Goal status: INITIAL  2.  Patient will report 75% improvement in low back pain with sitting, tranfers and bed mobility to improve QOL.  Baseline:  Goal status: INITIAL  3.  Patient will report 3 on lumbar FOTO to demonstrate improved functional ability.  Baseline: 57 Goal status: INITIAL   4.  Patient will demonstrate improved core strength by ability to stabilze with MMT of hip flexors Baseline:  Goal status: INITIAL   PLAN:  PT FREQUENCY: 2x/week  PT DURATION: 6 weeks  PLANNED INTERVENTIONS: Therapeutic exercises, Therapeutic activity, Neuromuscular re-education, Patient/Family education, Self Care, Joint mobilization, Dry Needling, Electrical stimulation, Spinal mobilization, Cryotherapy, Moist heat, Taping, Traction, and Manual therapy  PLAN FOR NEXT SESSION: progress quadruped core strength and add diagonals to half kneel if able; progress hip adductor flexibility and strength; DN glutes as needed  8:23 PM,07/15/22 Donita Brooks PT, DPT [facility] [place]   [phone]

## 2022-07-21 ENCOUNTER — Ambulatory Visit: Payer: BC Managed Care – PPO | Attending: Family Medicine | Admitting: Physical Therapy

## 2022-07-21 ENCOUNTER — Encounter: Payer: Self-pay | Admitting: Physical Therapy

## 2022-07-21 DIAGNOSIS — M5459 Other low back pain: Secondary | ICD-10-CM | POA: Diagnosis not present

## 2022-07-21 DIAGNOSIS — R252 Cramp and spasm: Secondary | ICD-10-CM | POA: Diagnosis not present

## 2022-07-21 DIAGNOSIS — M6281 Muscle weakness (generalized): Secondary | ICD-10-CM | POA: Insufficient documentation

## 2022-07-21 NOTE — Therapy (Signed)
OUTPATIENT PHYSICAL THERAPY LOWER EXTREMITY EVALUATION   Patient Name: [name] [name] MRN: [id number] DOB:06-06-1975, 47 y.o., female Today's Date: 07/21/2022  END OF SESSION:  PT End of Session - 07/21/22 0847     Visit Number 3    Date for PT Re-Evaluation 08/18/22    PT Start Time 0847    PT Stop Time 0930    PT Time Calculation (min) 43 min    Activity Tolerance Patient tolerated treatment well    Behavior During Therapy [facility] for tasks assessed/performed               Past Medical History:  Diagnosis Date   Allergy    Basal cell carcinoma    abdomen   Celiac disease 2009   Functional dyspepsia 09/05/2012   Gastritis    GERD (gastroesophageal reflux disease) 08/08/2012   Guillain Barr syndrome 04/01/2007   After influenza vaccine   Hyperthyroidism 04/01/2007   Hypoglycemia, unspecified 10/26/2007   Past Surgical History:  Procedure Laterality Date   CESAREAN SECTION     COLONOSCOPY  05/12/2007   Leone Payor   ESOPHAGOGASTRODUODENOSCOPY     SKIN CANCER EXCISION     abdomin   WISDOM TOOTH EXTRACTION     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   Fatigue 08/20/2021   Functional dyspepsia 09/05/2012   GERD (gastroesophageal reflux disease) 08/08/2012   Acne 08/08/2012   Hypoglycemia, unspecified 10/26/2007   Celiac disease 10/26/2007   Hyperthyroidism 04/01/2007   Guillain Barr syndrome 04/01/2007    PCP: [name], DO   REFERRING PROVIDER: [name], *   REFERRING DIAG:  [phone] (ICD-10-CM) - Bilateral hip pain  M54.42,M54.41,G89.29 (ICD-10-CM) - Chronic bilateral low back pain with bilateral sciatica    THERAPY DIAG:  Other low back pain  Muscle weakness (generalized)  Cramp  and spasm  Rationale for Evaluation and Treatment: Rehabilitation  ONSET DATE: 6-8 weeks ago  SUBJECTIVE:   SUBJECTIVE STATEMENT: The adductor stretches have helped me signif reduce pain in the hips.  My back is still about the same.  I am stiff this morning.  PERTINENT HISTORY: LBP since 47 yo, Csection, GBS when she was in her 35's - mild nerve damage PAIN:  Are you having pain? Yes: NPRS scale: 2/10 Pain location: low back L>R into bil groin Pain description: constant ache Aggravating factors: prolonged sitting, pain in the morning with bed mobility Relieving factors: lying on back  PRECAUTIONS: None  WEIGHT BEARING RESTRICTIONS: No  FALLS:  Has patient fallen in last 6 months? No  LIVING ENVIRONMENT: Lives with: lives with their spouse Lives in: House/apartment Stairs:  no issue with stairs Has following equipment at home: None  OCCUPATION: desk job, works at home  PLOF: Independent  PATIENT GOALS: Get back to a state of comfort  NEXT MD VISIT: 6 months  OBJECTIVE:   DIAGNOSTIC FINDINGS:  DG LUMBAR: Normal anatomic alignment. No acute fracture or dislocation. L5-S1 degenerative disc disease. T11-12 degenerative disc disease. L4-5 and L5-S1 facet degenerative changes. SI joints unremarkable.  DG BIL HIPS - negative  PATIENT SURVEYS:  FOTO 57 (goal 67)  COGNITION: Overall cognitive status: Within functional limits for tasks assessed     SENSATION: WFL  MUSCLE LENGTH: Hip Flexors:bil   POSTURE: decreased  lumbar lordosis  PALPATION: Bil glute med/min  LOWER EXTREMITY ROM: WNL bil   LOWER EXTREMITY MMT: 5/5 Bil, significant core weakness when testing hip flexors, but patient able to perform advanced planks and side-planks.  LOWER EXTREMITY SPECIAL TESTS:  Hip special tests: Luisa Hart (FABER) test: negative    TODAY'S TREATMENT:                                                                                                                               DATE:   07/21/22: UBE L3 1x1 standing Kneeling adductor stretch bil x 60" at diff angles Kneeling hip flexor stretch - works better for Pt to changed in HEP Standing deadlift holding bil 10lb dumbbells x10 Quadruped bird dog 5x5" Supine isometric dead bug max hold x3 rounds - Pt unable to control lumbar imprint with march to 90/90 with bil LE or with dying bug HEP progression Discussion of sitting posture "triangle" of level pubic bone and bil ischial tuberosities at work chair, use foot stool as needed, can remove physioball from "ball chair" and perform pelvic ROM while working sitting on ball Pt education: back and ab gripper and consequences of this  07/15/22 Supine Rt hip flexor stretch off table x20 sec, set up instructions Plank demo 2x10 sec hold Prone hip extension x10 reps each Half kneel hold x20 sec each side Half kneel hold with diagonal chops attempted x5 reps, but pt having difficulty with maintaining proper alignment Side plank on elbows/knees x20 sec each, PT verbal/tactile cues to avoid trunk rotation compensation Kneeling adductor stretch gentle rocking front/back x30 sec each side   07/07/22 See pt ed and HEP  PATIENT EDUCATION:  Education details: PT eval findings, anticipated POC, initial HEP, and role of DN  Person educated: Patient Education method: Explanation, Demonstration, and Handouts Education comprehension: verbalized understanding and returned demonstration  HOME EXERCISE PROGRAM: Access Code: [email or web address] URL: [email or web address] Date: 07/07/2022 Prepared by: Raynelle Fanning  Exercises - Supine Quadriceps Stretch with Strap on Table  - 2 x daily - 7 x weekly - 1 sets - 3 reps - 30-60 sec hold - Seated Hip Flexor Stretch  - 2 x daily - 7 x weekly - 1 sets - 3 reps - 30-60 sec hold - Active Hip Flexor Stretch  - 2 x daily - 7 x weekly - 1 sets - 10 reps - Primal Push Up  - 1 x daily - 3-4 x weekly - 1 sets - 10 reps - max hold  Patient  Education - Trigger Point Dry Needling  ASSESSMENT:  CLINICAL IMPRESSION: Pt demos signif weakness in deep core especially noted with supine efforts with dying bug and march to 90/90 with bil LE.  She has history of repeated spinal manipulations and reports she has to lay on the floor after standing or sitting for work x 15 min and her whole spine pops.  PT updated HEP to include dead bug isometric, bird dog, and low  load standing deadlift.  Long discussion and Pt education about sitting and standing posture and optimal alignment and stabilzation strategies today.  Pt appears to overuse obliques causing limited mobility and dynamic control of ribcage, and has adaptive shortening and overuse of lumbar paraspinals likely furthering compression of lumbar spine segments.    OBJECTIVE IMPAIRMENTS: decreased activity tolerance, decreased strength, increased muscle spasms, impaired flexibility, and pain.   ACTIVITY LIMITATIONS: sitting, sleeping, transfers, and bed mobility  PARTICIPATION LIMITATIONS:  all ADLS affected  PERSONAL FACTORS: Time since onset of injury/illness/exacerbation and 1 comorbidity: past GBS  are also affecting patient's functional outcome.   REHAB POTENTIAL: Excellent  CLINICAL DECISION MAKING: Stable/uncomplicated  EVALUATION COMPLEXITY: Low   GOALS: Goals reviewed with patient? Yes  SHORT TERM GOALS: Target date: 07/21/2022   Patient will be independent with initial HEP.  Baseline:  Goal status: ongoing  2.  Patient will report centralization of radicular symptoms by 07/28/22.  Baseline:    LONG TERM GOALS: Target date: 08/18/2022   Patient will be independent with advanced/ongoing HEP to improve outcomes and carryover.  Baseline:  Goal status: INITIAL  2.  Patient will report 75% improvement in low back pain with sitting, tranfers and bed mobility to improve QOL.  Baseline:  Goal status: INITIAL  3.  Patient will report 34 on lumbar FOTO to demonstrate  improved functional ability.  Baseline: 57 Goal status: INITIAL   4.  Patient will demonstrate improved core strength by ability to stabilze with MMT of hip flexors Baseline:  Goal status: INITIAL   PLAN:  PT FREQUENCY: 2x/week  PT DURATION: 6 weeks  PLANNED INTERVENTIONS: Therapeutic exercises, Therapeutic activity, Neuromuscular re-education, Patient/Family education, Self Care, Joint mobilization, Dry Needling, Electrical stimulation, Spinal mobilization, Cryotherapy, Moist heat, Taping, Traction, and Manual therapy  PLAN FOR NEXT SESSION: revisit postural alignment for sitting and standing at work, Pt has physioball chair she can use ball from as well, try open books for ribcage mobility, progress supine core as she demos improved control of lumbar imprint, quadruped core strength and add diagonals to half kneel if able; progress hip adductor flexibility and strength; DN lumbar paraspinals and glutes as needed  Morton Peters, PT 07/21/22 1:36 PM  [facility] [place]  [phone]

## 2022-07-23 ENCOUNTER — Encounter: Payer: Self-pay | Admitting: Rehabilitative and Restorative Service Providers"

## 2022-07-23 ENCOUNTER — Ambulatory Visit: Payer: BC Managed Care – PPO | Admitting: Rehabilitative and Restorative Service Providers"

## 2022-07-23 DIAGNOSIS — M6281 Muscle weakness (generalized): Secondary | ICD-10-CM | POA: Diagnosis not present

## 2022-07-23 DIAGNOSIS — M5459 Other low back pain: Secondary | ICD-10-CM | POA: Diagnosis not present

## 2022-07-23 DIAGNOSIS — R252 Cramp and spasm: Secondary | ICD-10-CM

## 2022-07-23 NOTE — Therapy (Signed)
OUTPATIENT PHYSICAL THERAPY TREATMENT NOTE   Patient Name: [name] [name] MRN: [id number] DOB:05-30-1975, 47 y.o., female Today's Date: 07/23/2022  END OF SESSION:  PT End of Session - 07/23/22 0804     Visit Number 4    Date for PT Re-Evaluation 08/18/22    PT Start Time 0802    PT Stop Time 0840    PT Time Calculation (min) 38 min    Activity Tolerance Patient tolerated treatment well    Behavior During Therapy [facility] for tasks assessed/performed               Past Medical History:  Diagnosis Date   Allergy    Basal cell carcinoma    abdomen   Celiac disease 2009   Functional dyspepsia 09/05/2012   Gastritis    GERD (gastroesophageal reflux disease) 08/08/2012   Guillain Barr syndrome 04/01/2007   After influenza vaccine   Hyperthyroidism 04/01/2007   Hypoglycemia, unspecified 10/26/2007   Past Surgical History:  Procedure Laterality Date   CESAREAN SECTION     COLONOSCOPY  05/12/2007   Leone Payor   ESOPHAGOGASTRODUODENOSCOPY     SKIN CANCER EXCISION     abdomin   WISDOM TOOTH EXTRACTION     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   Fatigue 08/20/2021   Functional dyspepsia 09/05/2012   GERD (gastroesophageal reflux disease) 08/08/2012   Acne 08/08/2012   Hypoglycemia, unspecified 10/26/2007   Celiac disease 10/26/2007   Hyperthyroidism 04/01/2007   Guillain Barr syndrome 04/01/2007    PCP: [name], DO   REFERRING PROVIDER: [name], *   REFERRING DIAG:  [phone] (ICD-10-CM) - Bilateral hip pain  M54.42,M54.41,G89.29 (ICD-10-CM) - Chronic bilateral low back pain with bilateral sciatica    THERAPY DIAG:  Other low back pain  Muscle weakness (generalized)  Cramp and  spasm  Rationale for Evaluation and Treatment: Rehabilitation  ONSET DATE: 6-8 weeks ago  SUBJECTIVE:   SUBJECTIVE STATEMENT: Patient reports that she is feeling some numbness in her glute region at times, but states overall, just feeling soreness.  PERTINENT HISTORY: LBP since 47 yo, Csection, GBS when she was in her 90's - mild nerve damage PAIN:  Are you having pain? Yes: NPRS scale: 1-2/10 Pain location: low back L>R into bil groin Pain description: constant ache Aggravating factors: prolonged sitting, pain in the morning with bed mobility Relieving factors: lying on back  PRECAUTIONS: None  WEIGHT BEARING RESTRICTIONS: No  FALLS:  Has patient fallen in last 6 months? No  LIVING ENVIRONMENT: Lives with: lives with their spouse Lives in: House/apartment Stairs:  no issue with stairs Has following equipment at home: None  OCCUPATION: desk job, works at home  PLOF: Independent  PATIENT GOALS: Get back to a state of comfort  NEXT MD VISIT: 6 months  OBJECTIVE:   DIAGNOSTIC FINDINGS:  DG LUMBAR: Normal anatomic alignment. No acute fracture or dislocation. L5-S1 degenerative disc disease. T11-12 degenerative disc disease. L4-5 and L5-S1 facet degenerative changes. SI joints unremarkable.  DG BIL HIPS - negative  PATIENT SURVEYS:  Eval:  FOTO 57 (goal 67)  COGNITION: Overall cognitive status: Within functional limits for tasks assessed     SENSATION: WFL  MUSCLE LENGTH: Hip Flexors:bil   POSTURE: decreased lumbar lordosis  PALPATION: Bil  glute med/min  LOWER EXTREMITY ROM: WNL bil   LOWER EXTREMITY MMT: 5/5 Bil, significant core weakness when testing hip flexors, but patient able to perform advanced planks and side-planks.  LOWER EXTREMITY SPECIAL TESTS:  Hip special tests: Luisa Hart (FABER) test: negative    TODAY'S TREATMENT:                                                                                                                                DATE:  07/23/2022 Eliptical level 1.0 x5 min FWD and x1 min backwards with PT present to discuss status Kneeling adductor stretch bil x 60 seconds at diff angles Seated on blue physioball:  4 way pelvic tilts x10 each Seated on blue physioball:  gentle bouncing while maintaining TA contraction x1 min Seated on blue physioball: marching and LAQ while maintaining TA contraction 2x10 each bilat Standing "L" counter stretch x20 sec Ambulation with high marching with hip abduction/ER 2x10 ft Standing Forward T holding 5# kettlebell x10 bilat Standing modified dead lift with 9# dumbbells x10 (cuing for maintaining proper posture/technique and proper spinal alignment) Supine isometric dead bug 3x20 sec Supine double knee to chest x20 sec   DATE:  07/21/22: UBE L3 1x1 standing Kneeling adductor stretch bil x 60" at diff angles Kneeling hip flexor stretch - works better for Pt to changed in HEP Standing deadlift holding bil 10lb dumbbells x10 Quadruped bird dog 5x5" Supine isometric dead bug max hold x3 rounds - Pt unable to control lumbar imprint with march to 90/90 with bil LE or with dying bug HEP progression Discussion of sitting posture "triangle" of level pubic bone and bil ischial tuberosities at work chair, use foot stool as needed, can remove physioball from "ball chair" and perform pelvic ROM while working sitting on ball Pt education: back and ab gripper and consequences of this  07/15/22 Supine Rt hip flexor stretch off table x20 sec, set up instructions Plank demo 2x10 sec hold Prone hip extension x10 reps each Half kneel hold x20 sec each side Half kneel hold with diagonal chops attempted x5 reps, but pt having difficulty with maintaining proper alignment Side plank on elbows/knees x20 sec each, PT verbal/tactile cues to avoid trunk rotation compensation Kneeling adductor stretch gentle rocking front/back x30 sec each side     PATIENT EDUCATION:  Education details: PT  eval findings, anticipated POC, initial HEP, and role of DN  Person educated: Patient Education method: Explanation, Demonstration, and Handouts Education comprehension: verbalized understanding and returned demonstration  HOME EXERCISE PROGRAM: Access Code: [email or web address] URL: [email or web address] Date: 07/23/2022 Prepared by: Clydie Braun Jannine Abreu  Exercises - Kneeling Adductor Stretch with Hip External Rotation  - 1 x daily - 7 x weekly - 1 sets - 1 reps - 60 sec hold - Half Kneeling Chop with Medicine Ball  - 1 x daily - 7 x weekly - 2 sets - 20 seconds hold - Half Kneeling Hip Flexor Stretch  - 1  x daily - 7 x weekly - 1 sets - 1 reps - 30-60" hold - Side Plank on Elbow  - 1 x daily - 7 x weekly - 3 sets - 20 seconds hold - Primal Push Up  - 1 x daily - 3-4 x weekly - 1 sets - 10 reps - max hold - Bird Dog  - 1 x daily - 7 x weekly - 1 sets - 10 reps - Isometric Dead Bug  - 1 x daily - 7 x weekly - 1 sets - 3 reps - 20 sec hold - Modified Deadlift with Pelvic Contraction  - 1 x daily - 7 x weekly - 1 sets - 10 reps - Forward T with Weight  - 1 x daily - 7 x weekly - 1 sets - 10 reps  ASSESSMENT:  CLINICAL IMPRESSION: Scottie presents to skilled PT reporting that she could not remember the new exercises and requested an updated handout.  States that the hip adduction stretches do seem to be helping.  Patient with improved core strength noted during visit and able to better maintain the isometric dead bug during session.  Patient with some new exercises added to facilitate core stability/strengthening.  Patient continues to require skilled PT to progress towards goal related activities.   OBJECTIVE IMPAIRMENTS: decreased activity tolerance, decreased strength, increased muscle spasms, impaired flexibility, and pain.   ACTIVITY LIMITATIONS: sitting, sleeping, transfers, and bed mobility  PARTICIPATION LIMITATIONS:  all ADLS affected  PERSONAL FACTORS: Time since onset of  injury/illness/exacerbation and 1 comorbidity: past GBS  are also affecting patient's functional outcome.   REHAB POTENTIAL: Excellent  CLINICAL DECISION MAKING: Stable/uncomplicated  EVALUATION COMPLEXITY: Low   GOALS: Goals reviewed with patient? Yes  SHORT TERM GOALS: Target date: 07/21/2022   Patient will be independent with initial HEP.  Baseline:  Goal status: MET on 07/23/2022  2.  Patient will report centralization of radicular symptoms by 07/28/22.  Baseline:  Goal status:  MET on 07/23/2022   LONG TERM GOALS: Target date: 08/18/2022   Patient will be independent with advanced/ongoing HEP to improve outcomes and carryover.  Baseline:  Goal status: IN PROGRESS  2.  Patient will report 75% improvement in low back pain with sitting, tranfers and bed mobility to improve QOL.  Baseline:  Goal status: INITIAL  3.  Patient will report 47 on lumbar FOTO to demonstrate improved functional ability.  Baseline: 57 Goal status: INITIAL   4.  Patient will demonstrate improved core strength by ability to stabilze with MMT of hip flexors Baseline:  Goal status: INITIAL   PLAN:  PT FREQUENCY: 2x/week  PT DURATION: 6 weeks  PLANNED INTERVENTIONS: Therapeutic exercises, Therapeutic activity, Neuromuscular re-education, Patient/Family education, Self Care, Joint mobilization, Dry Needling, Electrical stimulation, Spinal mobilization, Cryotherapy, Moist heat, Taping, Traction, and Manual therapy  PLAN FOR NEXT SESSION: revisit postural alignment for sitting and standing at work, Pt has physioball chair she can use ball from as well, try open books for ribcage mobility, progress supine core as she demos improved control of lumbar imprint, quadruped core strength and add diagonals to half kneel if able; progress hip adductor flexibility and strength; DN lumbar paraspinals and glutes as needed    Reather Laurence, PT 07/23/22 9:43 AM  [facility] [address] [place], [place] [address] Phone # [phone] Fax [phone]

## 2022-07-28 ENCOUNTER — Encounter: Payer: Self-pay | Admitting: Physical Therapy

## 2022-07-28 ENCOUNTER — Ambulatory Visit: Payer: BC Managed Care – PPO | Admitting: Physical Therapy

## 2022-07-28 DIAGNOSIS — R252 Cramp and spasm: Secondary | ICD-10-CM

## 2022-07-28 DIAGNOSIS — M9903 Segmental and somatic dysfunction of lumbar region: Secondary | ICD-10-CM | POA: Diagnosis not present

## 2022-07-28 DIAGNOSIS — M5459 Other low back pain: Secondary | ICD-10-CM | POA: Diagnosis not present

## 2022-07-28 DIAGNOSIS — M7711 Lateral epicondylitis, right elbow: Secondary | ICD-10-CM | POA: Diagnosis not present

## 2022-07-28 DIAGNOSIS — M6281 Muscle weakness (generalized): Secondary | ICD-10-CM | POA: Diagnosis not present

## 2022-07-28 DIAGNOSIS — M9905 Segmental and somatic dysfunction of pelvic region: Secondary | ICD-10-CM | POA: Diagnosis not present

## 2022-07-28 DIAGNOSIS — M9902 Segmental and somatic dysfunction of thoracic region: Secondary | ICD-10-CM | POA: Diagnosis not present

## 2022-07-28 NOTE — Therapy (Signed)
OUTPATIENT PHYSICAL THERAPY TREATMENT NOTE   Patient Name: [name] [name] MRN: [id number] DOB:07-02-1975, 47 y.o., female Today's Date: 07/28/2022  END OF SESSION:  PT End of Session - 07/28/22 0848     Visit Number 5    Date for PT Re-Evaluation 08/18/22    PT Start Time 0846    PT Stop Time 0930    PT Time Calculation (min) 44 min    Activity Tolerance Patient tolerated treatment well    Behavior During Therapy [facility] for tasks assessed/performed                Past Medical History:  Diagnosis Date   Allergy    Basal cell carcinoma    abdomen   Celiac disease 2009   Functional dyspepsia 09/05/2012   Gastritis    GERD (gastroesophageal reflux disease) 08/08/2012   Guillain Barr syndrome 04/01/2007   After influenza vaccine   Hyperthyroidism 04/01/2007   Hypoglycemia, unspecified 10/26/2007   Past Surgical History:  Procedure Laterality Date   CESAREAN SECTION     COLONOSCOPY  05/12/2007   Leone Payor   ESOPHAGOGASTRODUODENOSCOPY     SKIN CANCER EXCISION     abdomin   WISDOM TOOTH EXTRACTION     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   Fatigue 08/20/2021   Functional dyspepsia 09/05/2012   GERD (gastroesophageal reflux disease) 08/08/2012   Acne 08/08/2012   Hypoglycemia, unspecified 10/26/2007   Celiac disease 10/26/2007   Hyperthyroidism 04/01/2007   Guillain Barr syndrome 04/01/2007    PCP: [name], DO   REFERRING PROVIDER: [name], *   REFERRING DIAG:  [phone] (ICD-10-CM) - Bilateral hip pain  M54.42,M54.41,G89.29 (ICD-10-CM) - Chronic bilateral low back pain with bilateral sciatica    THERAPY DIAG:  Other low back pain  Muscle weakness (generalized)  Cramp and  spasm  Rationale for Evaluation and Treatment: Rehabilitation  ONSET DATE: 6-8 weeks ago  SUBJECTIVE:   SUBJECTIVE STATEMENT: I was doing really well but as of last week I feel a sensation as though right before something falls asleep across my low back.  My hips are feeling much better and am trying to work on my posture while at work.  Being still and sitting is still very uncomfortable - reaches a 6/10.  Otherwise when I'm up and moving a 3/10.  PERTINENT HISTORY: LBP since 47 yo, Csection, GBS when she was in her 65's - mild nerve damage PAIN:  Are you having pain? Yes: NPRS scale: 3-6/10 Pain location: low back L>R into bil groin Pain description: constant ache Aggravating factors: prolonged sitting, pain in the morning with bed mobility Relieving factors: lying on back  PRECAUTIONS: None  WEIGHT BEARING RESTRICTIONS: No  FALLS:  Has patient fallen in last 6 months? No  LIVING ENVIRONMENT: Lives with: lives with their spouse Lives in: House/apartment Stairs:  no issue with stairs Has following equipment at home: None  OCCUPATION: desk job, works at home  PLOF: Independent  PATIENT GOALS: Get back to a state of comfort  NEXT MD VISIT: 6 months  OBJECTIVE:   DIAGNOSTIC FINDINGS:  DG LUMBAR: Normal anatomic alignment. No acute fracture or dislocation. L5-S1 degenerative disc disease. T11-12 degenerative disc disease. L4-5 and L5-S1 facet degenerative changes. SI joints unremarkable.  DG BIL HIPS - negative  PATIENT SURVEYS:  Eval:  FOTO 57 (goal 67)  COGNITION: Overall cognitive status: Within functional limits for tasks assessed     SENSATION: WFL  MUSCLE LENGTH: Hip Flexors:bil   POSTURE: decreased lumbar lordosis  PALPATION: Bil glute med/min  LOWER EXTREMITY ROM: WNL bil   LOWER EXTREMITY MMT: 5/5 Bil, significant core weakness when testing hip flexors, but patient able to perform advanced planks and side-planks.  LOWER EXTREMITY SPECIAL  TESTS:  Hip special tests: Luisa Hart (FABER) test: negative    TODAY'S TREATMENT:                                                                                                                               DATE:  07/28/2022 Elliptical L1 4' fwd, 1' bwd, PT present to discuss plan for session Open book bil x 5 each way with inhale exhale at first barrier for further ROM each rep Supine bridge with black loop hip abd 5x5" - PT cued Pt to put hands on ribcage to feel to prevent flare of ribs Hooklying bil 5lb UE weights overhead and back with ribcage knitting x 8 90/90 legs single 5lb overhead and back x 6, then add opp arm and foot toe tap Standing alt march to 90/90 with self-palpation on ribcage and anterior pelvis x 16 Standing march with d2 5lb dumbbell hand to knee at 90/90 Prone STM bil lower thoracic paraspinals and lumbar paraspinals, prone manual distraction S1 from L5, suction cup dragging for fascial mobility Trigger Point Dry-Needling  Treatment instructions: Expect mild to moderate muscle soreness. S/S of pneumothorax if dry needled over a lung field, and to seek immediate medical attention should they occur. Patient verbalized understanding of these instructions and education.  Patient Consent Given: Yes Education handout provided: Previously provided Muscles treated: bil lumbar multifidi Electrical stimulation performed: No Parameters: N/A Treatment response/outcome: twitch, deep cramp, release   DATE:  07/23/2022 Eliptical level 1.0 x5 min FWD and x1 min backwards with PT present to discuss status Kneeling adductor stretch bil x 60 seconds at diff angles Seated on blue physioball:  4 way pelvic tilts x10 each Seated on blue physioball:  gentle bouncing while maintaining TA contraction x1 min Seated on blue physioball: marching and LAQ while maintaining TA contraction 2x10 each bilat Standing "L" counter stretch x20 sec Ambulation with high marching with hip abduction/ER  2x10 ft Standing Forward T holding 5# kettlebell x10 bilat Standing modified dead lift with 9# dumbbells x10 (cuing for maintaining proper posture/technique and proper spinal alignment) Supine isometric dead bug 3x20 sec Supine double knee to chest x20 sec   DATE:  07/21/22: UBE L3 1x1 standing Kneeling adductor stretch bil x 60" at diff angles Kneeling hip flexor stretch - works better for Pt to changed in HEP Standing deadlift holding bil 10lb dumbbells x10 Quadruped bird dog 5x5" Supine isometric dead bug max hold x3 rounds - Pt unable to control  lumbar imprint with march to 90/90 with bil LE or with dying bug HEP progression Discussion of sitting posture "triangle" of level pubic bone and bil ischial tuberosities at work chair, use foot stool as needed, can remove physioball from "ball chair" and perform pelvic ROM while working sitting on ball Pt education: back and ab gripper and consequences of this   PATIENT EDUCATION:  Education details: PT eval findings, anticipated POC, initial HEP, and role of DN  Person educated: Patient Education method: Explanation, Demonstration, and Handouts Education comprehension: verbalized understanding and returned demonstration  HOME EXERCISE PROGRAM: Access Code: [email or web address] URL: [email or web address] Date: 07/23/2022 Prepared by: Clydie Braun Menke  Exercises - Kneeling Adductor Stretch with Hip External Rotation  - 1 x daily - 7 x weekly - 1 sets - 1 reps - 60 sec hold - Half Kneeling Chop with Medicine Ball  - 1 x daily - 7 x weekly - 2 sets - 20 seconds hold - Half Kneeling Hip Flexor Stretch  - 1 x daily - 7 x weekly - 1 sets - 1 reps - 30-60" hold - Side Plank on Elbow  - 1 x daily - 7 x weekly - 3 sets - 20 seconds hold - Primal Push Up  - 1 x daily - 3-4 x weekly - 1 sets - 10 reps - max hold - Bird Dog  - 1 x daily - 7 x weekly - 1 sets - 10 reps - Isometric Dead Bug  - 1 x daily - 7 x weekly - 1 sets - 3 reps - 20 sec  hold - Modified Deadlift with Pelvic Contraction  - 1 x daily - 7 x weekly - 1 sets - 10 reps - Forward T with Weight  - 1 x daily - 7 x weekly - 1 sets - 10 reps  ASSESSMENT:  CLINICAL IMPRESSION: Pt with report of increased pain with sitting to 6/10 and a feeling as though her low back is "about to fall asleep" since last week.  PT added DN to bil lumbar multifidi today and progressed core challenges with improving awareness of stabilization activation within good posture.  She will benefit from progression of HEP with detailed cues next time.  Open book used today to release ribcage and downtrain obliques and paraspinal overuse.   OBJECTIVE IMPAIRMENTS: decreased activity tolerance, decreased strength, increased muscle spasms, impaired flexibility, and pain.   ACTIVITY LIMITATIONS: sitting, sleeping, transfers, and bed mobility  PARTICIPATION LIMITATIONS:  all ADLS affected  PERSONAL FACTORS: Time since onset of injury/illness/exacerbation and 1 comorbidity: past GBS  are also affecting patient's functional outcome.   REHAB POTENTIAL: Excellent  CLINICAL DECISION MAKING: Stable/uncomplicated  EVALUATION COMPLEXITY: Low   GOALS: Goals reviewed with patient? Yes  SHORT TERM GOALS: Target date: 07/21/2022   Patient will be independent with initial HEP.  Baseline:  Goal status: MET on 07/23/2022  2.  Patient will report centralization of radicular symptoms by 07/28/22.  Baseline:  Goal status:  MET on 07/23/2022   LONG TERM GOALS: Target date: 08/18/2022   Patient will be independent with advanced/ongoing HEP to improve outcomes and carryover.  Baseline:  Goal status: IN PROGRESS  2.  Patient will report 75% improvement in low back pain with sitting, tranfers and bed mobility to improve QOL.  Baseline:  Goal status: INITIAL  3.  Patient will report 67 on lumbar FOTO to demonstrate improved functional ability.  Baseline: 57 Goal status: INITIAL   4.  Patient will demonstrate  improved core  strength by ability to stabilze with MMT of hip flexors Baseline:  Goal status: INITIAL   PLAN:  PT FREQUENCY: 2x/week  PT DURATION: 6 weeks  PLANNED INTERVENTIONS: Therapeutic exercises, Therapeutic activity, Neuromuscular re-education, Patient/Family education, Self Care, Joint mobilization, Dry Needling, Electrical stimulation, Spinal mobilization, Cryotherapy, Moist heat, Taping, Traction, and Manual therapy  PLAN FOR NEXT SESSION: f/u on DN to lumbar multifidi, progress HEP with detailed cues as needed, revisit postural alignment for sitting and standing at work, Pt has physioball chair she can use ball from as well, progress supine core as she demos improved control of lumbar imprint, quadruped core strength and add diagonals to half kneel if able; progress hip adductor flexibility and strength   Morton Peters, PT 07/28/22 1:30 PM   [facility] [address] [place], [place] [address] Phone # [phone] Fax [phone]

## 2022-08-03 ENCOUNTER — Ambulatory Visit: Payer: BC Managed Care – PPO | Admitting: Physical Therapy

## 2022-08-03 ENCOUNTER — Encounter: Payer: Self-pay | Admitting: Physical Therapy

## 2022-08-03 DIAGNOSIS — R252 Cramp and spasm: Secondary | ICD-10-CM | POA: Diagnosis not present

## 2022-08-03 DIAGNOSIS — M6281 Muscle weakness (generalized): Secondary | ICD-10-CM | POA: Diagnosis not present

## 2022-08-03 DIAGNOSIS — M5459 Other low back pain: Secondary | ICD-10-CM | POA: Diagnosis not present

## 2022-08-03 NOTE — Therapy (Signed)
OUTPATIENT PHYSICAL THERAPY TREATMENT NOTE   Patient Name: [name] [name] MRN: [id number] DOB:04/23/1975, 47 y.o., female Today's Date: 08/03/2022  END OF SESSION:  PT End of Session - 08/03/22 1233     Visit Number 6    Date for PT Re-Evaluation 08/18/22    PT Start Time 1233    PT Stop Time 1318    PT Time Calculation (min) 45 min    Activity Tolerance Patient tolerated treatment well    Behavior During Therapy [facility] for tasks assessed/performed                 Past Medical History:  Diagnosis Date   Allergy    Basal cell carcinoma    abdomen   Celiac disease 2009   Functional dyspepsia 09/05/2012   Gastritis    GERD (gastroesophageal reflux disease) 08/08/2012   Guillain Barr syndrome 04/01/2007   After influenza vaccine   Hyperthyroidism 04/01/2007   Hypoglycemia, unspecified 10/26/2007   Past Surgical History:  Procedure Laterality Date   CESAREAN SECTION     COLONOSCOPY  05/12/2007   Leone Payor   ESOPHAGOGASTRODUODENOSCOPY     SKIN CANCER EXCISION     abdomin   WISDOM TOOTH EXTRACTION     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   Fatigue 08/20/2021   Functional dyspepsia 09/05/2012   GERD (gastroesophageal reflux disease) 08/08/2012   Acne 08/08/2012   Hypoglycemia, unspecified 10/26/2007   Celiac disease 10/26/2007   Hyperthyroidism 04/01/2007   Priscella Mann Barr syndrome 04/01/2007    PCP: [name], DO   REFERRING PROVIDER: [name], *   REFERRING DIAG:  [phone] (ICD-10-CM) - Bilateral hip pain  M54.42,M54.41,G89.29 (ICD-10-CM) - Chronic bilateral low back pain with bilateral sciatica    THERAPY DIAG:  Other low back pain  Muscle weakness (generalized)  Rationale  for Evaluation and Treatment: Rehabilitation  ONSET DATE: 6-8 weeks ago  SUBJECTIVE:   SUBJECTIVE STATEMENT: My low back felt the best I can ever remember it feeling since the DN.  I continue to have some discomfort in the tailbone sides.  PERTINENT HISTORY: LBP since 47 yo, Csection, GBS when she was in her 61's - mild nerve damage PAIN:  Are you having pain? Yes: NPRS scale: 3-6/10 Pain location: low back L>R into bil groin Pain description: constant ache Aggravating factors: prolonged sitting, pain in the morning with bed mobility Relieving factors: lying on back  PRECAUTIONS: None  WEIGHT BEARING RESTRICTIONS: No  FALLS:  Has patient fallen in last 6 months? No  LIVING ENVIRONMENT: Lives with: lives with their spouse Lives in: House/apartment Stairs:  no issue with stairs Has following equipment at home: None  OCCUPATION: desk job, works at home  PLOF: Independent  PATIENT GOALS: Get back to a state of comfort  NEXT MD VISIT: 6 months  OBJECTIVE:   DIAGNOSTIC FINDINGS:  DG LUMBAR: Normal anatomic alignment. No acute fracture or dislocation. L5-S1 degenerative disc disease. T11-12 degenerative disc disease. L4-5 and L5-S1 facet degenerative changes. SI joints unremarkable.  DG BIL HIPS - negative  PATIENT SURVEYS:  Eval:  FOTO 57 (goal 67)  COGNITION: Overall cognitive status: Within functional limits for tasks assessed     SENSATION: WFL  MUSCLE LENGTH: Hip Flexors:bil   POSTURE: decreased lumbar  lordosis  PALPATION: Bil glute med/min  LOWER EXTREMITY ROM: WNL bil   LOWER EXTREMITY MMT: 5/5 Bil, significant core weakness when testing hip flexors, but patient able to perform advanced planks and side-planks.  LOWER EXTREMITY SPECIAL TESTS:  Hip special tests: Luisa Hart [facility]) test: negative    TODAY'S TREATMENT:                                                                                                                               DATE: 08/03/22 Elliptical x2' fwd PT present to discuss status and plan for session Trigger Point Dry-Needling  Treatment instructions: Expect mild to moderate muscle soreness. S/S of pneumothorax if dry needled over a lung field, and to seek immediate medical attention should they occur. Patient verbalized understanding of these instructions and education.  Patient Consent Given: Yes Education handout provided: Yes Muscles treated: bil lumbar multifidi, bil glut med and piriformis Electrical stimulation performed: No Parameters: N/A Treatment response/outcome: deep ache and release STM lumbar paraspinals, manual traction in prone sacral distraction from L5, Lt thoracic paraspinal stripping and broadening, skin rolling central lumbar spine, supine manual thoracic rotation bil (pull ribcage upward while standing on opp side) Open books with diaphragmatic breathing at end range each rep bil x 5 Supine diaphragmatic inhale with tactile cueing on bil lateral ribcage, exhale with open mouth "HAAAAA" taking voice from high pitch to low x 6 reps   DATE:  07/28/2022 Elliptical L1 4' fwd, 1' bwd, PT present to discuss plan for session Open book bil x 5 each way with inhale exhale at first barrier for further ROM each rep Supine bridge with black loop hip abd 5x5" - PT cued Pt to put hands on ribcage to feel to prevent flare of ribs Hooklying bil 5lb UE weights overhead and back with ribcage knitting x 8 90/90 legs single 5lb overhead and back x 6, then add opp arm and foot toe tap Standing alt march to 90/90 with self-palpation on ribcage and anterior pelvis x 16 Standing march with d2 5lb dumbbell hand to knee at 90/90 Prone STM bil lower thoracic paraspinals and lumbar paraspinals, prone manual distraction S1 from L5, suction cup dragging for fascial mobility Trigger Point Dry-Needling  Treatment instructions: Expect mild to moderate muscle soreness. S/S of pneumothorax if dry needled over a lung  field, and to seek immediate medical attention should they occur. Patient verbalized understanding of these instructions and education.  Patient Consent Given: Yes Education handout provided: Previously provided Muscles treated: bil lumbar multifidi Electrical stimulation performed: No Parameters: N/A Treatment response/outcome: twitch, deep cramp, release   DATE:  07/23/2022 Eliptical level 1.0 x5 min FWD and x1 min backwards with PT present to discuss status Kneeling adductor stretch bil x 60 seconds at diff angles Seated on blue physioball:  4 way pelvic tilts x10 each Seated on blue physioball:  gentle bouncing while maintaining TA contraction x1 min Seated on blue physioball: marching and  LAQ while maintaining TA contraction 2x10 each bilat Standing "L" counter stretch x20 sec Ambulation with high marching with hip abduction/ER 2x10 ft Standing Forward T holding 5# kettlebell x10 bilat Standing modified dead lift with 9# dumbbells x10 (cuing for maintaining proper posture/technique and proper spinal alignment) Supine isometric dead bug 3x20 sec Supine double knee to chest x20 sec   PATIENT EDUCATION:  Education details: PT eval findings, anticipated POC, initial HEP, and role of DN  Person educated: Patient Education method: Explanation, Demonstration, and Handouts Education comprehension: verbalized understanding and returned demonstration  HOME EXERCISE PROGRAM: Access Code: [email or web address] URL: [email or web address] Date: 07/23/2022 Prepared by: Clydie Braun Menke  Exercises - Kneeling Adductor Stretch with Hip External Rotation  - 1 x daily - 7 x weekly - 1 sets - 1 reps - 60 sec hold - Half Kneeling Chop with Medicine Ball  - 1 x daily - 7 x weekly - 2 sets - 20 seconds hold - Half Kneeling Hip Flexor Stretch  - 1 x daily - 7 x weekly - 1 sets - 1 reps - 30-60" hold - Side Plank on Elbow  - 1 x daily - 7 x weekly - 3 sets - 20 seconds hold - Primal Push Up  - 1 x  daily - 3-4 x weekly - 1 sets - 10 reps - max hold - Bird Dog  - 1 x daily - 7 x weekly - 1 sets - 10 reps - Isometric Dead Bug  - 1 x daily - 7 x weekly - 1 sets - 3 reps - 20 sec hold - Modified Deadlift with Pelvic Contraction  - 1 x daily - 7 x weekly - 1 sets - 10 reps - Forward T with Weight  - 1 x daily - 7 x weekly - 1 sets - 10 reps  ASSESSMENT:  CLINICAL IMPRESSION: Pt reports her lumbar spine felt as good as it's ever felt after DN last time.  Repeated today and added bil lateral hips with deep dull ache response bil.  Pt with signif tone in Lt thoracic paraspinals with Lt thoracic torsion.  Pt has limited ribcage mobility and tight diaphragm so manual techniques directed at release and stretching with breath cueing.    OBJECTIVE IMPAIRMENTS: decreased activity tolerance, decreased strength, increased muscle spasms, impaired flexibility, and pain.   ACTIVITY LIMITATIONS: sitting, sleeping, transfers, and bed mobility  PARTICIPATION LIMITATIONS:  all ADLS affected  PERSONAL FACTORS: Time since onset of injury/illness/exacerbation and 1 comorbidity: past GBS  are also affecting patient's functional outcome.   REHAB POTENTIAL: Excellent  CLINICAL DECISION MAKING: Stable/uncomplicated  EVALUATION COMPLEXITY: Low   GOALS: Goals reviewed with patient? Yes  SHORT TERM GOALS: Target date: 07/21/2022   Patient will be independent with initial HEP.  Baseline:  Goal status: MET on 07/23/2022  2.  Patient will report centralization of radicular symptoms by 07/28/22.  Baseline:  Goal status:  MET on 07/23/2022   LONG TERM GOALS: Target date: 08/18/2022   Patient will be independent with advanced/ongoing HEP to improve outcomes and carryover.  Baseline:  Goal status: IN PROGRESS  2.  Patient will report 75% improvement in low back pain with sitting, tranfers and bed mobility to improve QOL.  Baseline:  Goal status: INITIAL  3.  Patient will report 37 on lumbar FOTO to  demonstrate improved functional ability.  Baseline: 57 Goal status: INITIAL   4.  Patient will demonstrate improved core strength by ability to stabilze with MMT of hip flexors Baseline:  Goal status: INITIAL   PLAN:  PT FREQUENCY: 2x/week  PT DURATION: 6 weeks  PLANNED INTERVENTIONS: Therapeutic exercises, Therapeutic activity, Neuromuscular re-education, Patient/Family education, Self Care, Joint mobilization, Dry Needling, Electrical stimulation, Spinal mobilization, Cryotherapy, Moist heat, Taping, Traction, and Manual therapy  PLAN FOR NEXT SESSION: f/u on DN#2 to lumbar multifidi and DN#1 to bil hips, continue Lt thoracic STM, ribcage mobs and diaphragm release/breathing,  progress HEP with detailed cues as needed, revisit postural alignment for sitting and standing at work, Pt has physioball chair she can use ball from as well, progress supine core as she demos improved control of lumbar imprint, quadruped core strength and add diagonals to half kneel if able; progress hip adductor flexibility and strength   Morton Peters, PT 08/03/22 1:27 PM     [facility] [address] [place], [place] [address] Phone # [phone] Fax [phone]

## 2022-08-06 ENCOUNTER — Ambulatory Visit: Payer: BC Managed Care – PPO | Admitting: Physical Therapy

## 2022-08-06 ENCOUNTER — Encounter: Payer: Self-pay | Admitting: Physical Therapy

## 2022-08-06 DIAGNOSIS — R252 Cramp and spasm: Secondary | ICD-10-CM | POA: Diagnosis not present

## 2022-08-06 DIAGNOSIS — M5459 Other low back pain: Secondary | ICD-10-CM | POA: Diagnosis not present

## 2022-08-06 DIAGNOSIS — M6281 Muscle weakness (generalized): Secondary | ICD-10-CM | POA: Diagnosis not present

## 2022-08-06 NOTE — Therapy (Signed)
OUTPATIENT PHYSICAL THERAPY TREATMENT NOTE   Patient Name: [name] [name] MRN: [id number] DOB:04-Nov-1975, 47 y.o., female Today's Date: 08/06/2022  END OF SESSION:  PT End of Session - 08/06/22 1016     Visit Number 7    Date for PT Re-Evaluation 08/18/22    PT Start Time 1016    PT Stop Time 1102    PT Time Calculation (min) 46 min    Activity Tolerance Patient tolerated treatment well    Behavior During Therapy [facility] for tasks assessed/performed                 Past Medical History:  Diagnosis Date   Allergy    Basal cell carcinoma    abdomen   Celiac disease 2009   Functional dyspepsia 09/05/2012   Gastritis    GERD (gastroesophageal reflux disease) 08/08/2012   Guillain Barr syndrome 04/01/2007   After influenza vaccine   Hyperthyroidism 04/01/2007   Hypoglycemia, unspecified 10/26/2007   Past Surgical History:  Procedure Laterality Date   CESAREAN SECTION     COLONOSCOPY  05/12/2007   Leone Payor   ESOPHAGOGASTRODUODENOSCOPY     SKIN CANCER EXCISION     abdomin   WISDOM TOOTH EXTRACTION     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   Fatigue 08/20/2021   Functional dyspepsia 09/05/2012   GERD (gastroesophageal reflux disease) 08/08/2012   Acne 08/08/2012   Hypoglycemia, unspecified 10/26/2007   Celiac disease 10/26/2007   Hyperthyroidism 04/01/2007   Guillain Barr syndrome 04/01/2007    PCP: [name], DO   REFERRING PROVIDER: [name], *   REFERRING DIAG:  [phone] (ICD-10-CM) - Bilateral hip pain  M54.42,M54.41,G89.29 (ICD-10-CM) - Chronic bilateral low back pain with bilateral sciatica    THERAPY DIAG:  Other low back pain  Muscle weakness (generalized)  Cramp and  spasm  Rationale for Evaluation and Treatment: Rehabilitation  ONSET DATE: 6-8 weeks ago  SUBJECTIVE:   SUBJECTIVE STATEMENT: I did really well through yesterday but I'm up and having a lot of pain again in sitting for work.  If I arch my back even a little bit I get shooting down Lt glut.    PERTINENT HISTORY: LBP since 47 yo, Csection, GBS when she was in her 2's - mild nerve damage PAIN:  Are you having pain? Yes: NPRS scale: 3-5/10 Pain location: low back L>R into bil groin Pain description: constant ache Aggravating factors: prolonged sitting, pain in the morning with bed mobility Relieving factors: lying on back  PRECAUTIONS: None  WEIGHT BEARING RESTRICTIONS: No  FALLS:  Has patient fallen in last 6 months? No  LIVING ENVIRONMENT: Lives with: lives with their spouse Lives in: House/apartment Stairs:  no issue with stairs Has following equipment at home: None  OCCUPATION: desk job, works at home  PLOF: Independent  PATIENT GOALS: Get back to a state of comfort  NEXT MD VISIT: 6 months  OBJECTIVE:   DIAGNOSTIC FINDINGS:  DG LUMBAR: Normal anatomic alignment. No acute fracture or dislocation. L5-S1 degenerative disc disease. T11-12 degenerative disc disease. L4-5 and L5-S1 facet degenerative changes. SI joints unremarkable.  DG BIL HIPS - negative  PATIENT SURVEYS:  Eval:  FOTO 57 (goal 67)  COGNITION: Overall cognitive status: Within functional limits for tasks assessed  SENSATION: WFL  MUSCLE LENGTH: Hip Flexors:bil   POSTURE: decreased lumbar lordosis  PALPATION: Bil glute med/min  LOWER EXTREMITY ROM: WNL bil   LOWER EXTREMITY MMT: 5/5 Bil, significant core weakness when testing hip flexors, but patient able to perform advanced planks and side-planks.  LOWER EXTREMITY SPECIAL TESTS:  Hip special tests: Luisa Hart ([facility]) test: negative    TODAY'S TREATMENT:                                                                                                                               08/06/22: Standing bil red tband shoulder extension and flexion from extension 10x5" each for postural awareness and core activation Mini squat standing on flat side BOSU x 10 2lb bil fwd/lat raise standing on flat side BOSU X10 Fwd and lat step to lunge on rounded BOSU X 10 each LE Standing T x10 each hold 5lb dumbbell Bwd resistance cable walking 15lb x 10 (gave black tband for HEP) Cat/cow x 10, on last rep stay in cow and 10x tail wag, 10x quadupred rocking in lumbar ext Standing backbend hands on hips x 5 - improved range without pain end of session Discussion of need for AM routine to do short walk, spine ROM, and loading dumbbell global neutral spine therex to loosen up and get blood flowing  DATE: 08/03/22 Elliptical x2' fwd PT present to discuss status and plan for session Trigger Point Dry-Needling  Treatment instructions: Expect mild to moderate muscle soreness. S/S of pneumothorax if dry needled over a lung field, and to seek immediate medical attention should they occur. Patient verbalized understanding of these instructions and education.  Patient Consent Given: Yes Education handout provided: Yes Muscles treated: bil lumbar multifidi, bil glut med and piriformis Electrical stimulation performed: No Parameters: N/A Treatment response/outcome: deep ache and release STM lumbar paraspinals, manual traction in prone sacral distraction from L5, Lt thoracic paraspinal stripping and broadening, skin rolling central lumbar spine, supine manual thoracic rotation bil (pull ribcage upward while standing on opp side) Open books with diaphragmatic breathing at end range each rep bil x 5 Supine diaphragmatic inhale with tactile cueing on bil lateral ribcage, exhale with open mouth "HAAAAA" taking voice from high pitch to low x 6 reps   DATE:  07/28/2022 Elliptical L1 4' fwd, 1' bwd, PT present to discuss plan for session Open book bil x 5  each way with inhale exhale at first barrier for further ROM each rep Supine bridge with black loop hip abd 5x5" - PT cued Pt to put hands on ribcage to feel to prevent flare of ribs Hooklying bil 5lb UE weights overhead and back with ribcage knitting x 8 90/90 legs single 5lb overhead and back x 6, then add opp arm and foot toe tap Standing alt march to 90/90 with self-palpation on ribcage and anterior pelvis x 16 Standing march with d2 5lb dumbbell hand to knee at 90/90 Prone STM bil lower thoracic  paraspinals and lumbar paraspinals, prone manual distraction S1 from L5, suction cup dragging for fascial mobility Trigger Point Dry-Needling  Treatment instructions: Expect mild to moderate muscle soreness. S/S of pneumothorax if dry needled over a lung field, and to seek immediate medical attention should they occur. Patient verbalized understanding of these instructions and education.  Patient Consent Given: Yes Education handout provided: Previously provided Muscles treated: bil lumbar multifidi Electrical stimulation performed: No Parameters: N/A Treatment response/outcome: twitch, deep cramp, release   PATIENT EDUCATION:  Education details: PT eval findings, anticipated POC, initial HEP, and role of DN  Person educated: Patient Education method: Explanation, Demonstration, and Handouts Education comprehension: verbalized understanding and returned demonstration  HOME EXERCISE PROGRAM: Access Code: [email or web address] URL: [email or web address] Date: 07/23/2022 Prepared by: Clydie Braun Menke  Exercises - Kneeling Adductor Stretch with Hip External Rotation  - 1 x daily - 7 x weekly - 1 sets - 1 reps - 60 sec hold - Half Kneeling Chop with Medicine Ball  - 1 x daily - 7 x weekly - 2 sets - 20 seconds hold - Half Kneeling Hip Flexor Stretch  - 1 x daily - 7 x weekly - 1 sets - 1 reps - 30-60" hold - Side Plank on Elbow  - 1 x daily - 7 x weekly - 3 sets - 20 seconds hold - Primal  Push Up  - 1 x daily - 3-4 x weekly - 1 sets - 10 reps - max hold - Bird Dog  - 1 x daily - 7 x weekly - 1 sets - 10 reps - Isometric Dead Bug  - 1 x daily - 7 x weekly - 1 sets - 3 reps - 20 sec hold - Modified Deadlift with Pelvic Contraction  - 1 x daily - 7 x weekly - 1 sets - 10 reps - Forward T with Weight  - 1 x daily - 7 x weekly - 1 sets - 10 reps  ASSESSMENT:  CLINICAL IMPRESSION: Pt had a really good day and a half after last session but is having increased sharp and shooting gluteal pain today in sitting at work.  Session spent focusing on core strength and staying in neutral spine using hip hinge body mechanics with careful monitoring by PT for any exacerbation of symptoms.  She had improved range without pain into lumbar extension end of session.  Discussed need for AM routine to loosen up and get blood flowing through muscles before settling into work and taking frequent breaks from sitting as able throughout day.  Pt may be getting discogenic pain and if not improved with PT, discussed a possible need for a lumbar MRI with referring MD.   OBJECTIVE IMPAIRMENTS: decreased activity tolerance, decreased strength, increased muscle spasms, impaired flexibility, and pain.   ACTIVITY LIMITATIONS: sitting, sleeping, transfers, and bed mobility  PARTICIPATION LIMITATIONS:  all ADLS affected  PERSONAL FACTORS: Time since onset of injury/illness/exacerbation and 1 comorbidity: past GBS  are also affecting patient's functional outcome.   REHAB POTENTIAL: Excellent  CLINICAL DECISION MAKING: Stable/uncomplicated  EVALUATION COMPLEXITY: Low   GOALS: Goals reviewed with patient? Yes  SHORT TERM GOALS: Target date: 07/21/2022   Patient will be independent with initial HEP.  Baseline:  Goal status: MET on 07/23/2022  2.  Patient will report centralization of radicular symptoms by 07/28/22.  Baseline:  Goal status:  MET on 07/23/2022   LONG TERM GOALS: Target date: 08/18/2022   Patient  will be independent with advanced/ongoing HEP to improve outcomes and  carryover.  Baseline:  Goal status: IN PROGRESS  2.  Patient will report 75% improvement in low back pain with sitting, tranfers and bed mobility to improve QOL.  Baseline:  Goal status: INITIAL  3.  Patient will report 63 on lumbar FOTO to demonstrate improved functional ability.  Baseline: 57 Goal status: INITIAL   4.  Patient will demonstrate improved core strength by ability to stabilze with MMT of hip flexors Baseline:  Goal status: INITIAL   PLAN:  PT FREQUENCY: 2x/week  PT DURATION: 6 weeks  PLANNED INTERVENTIONS: Therapeutic exercises, Therapeutic activity, Neuromuscular re-education, Patient/Family education, Self Care, Joint mobilization, Dry Needling, Electrical stimulation, Spinal mobilization, Cryotherapy, Moist heat, Taping, Traction, and Manual therapy  PLAN FOR NEXT SESSION: f/u on DN#2 to lumbar multifidi and DN#1 to bil hips, continue Lt thoracic STM, ribcage mobs and diaphragm release/breathing,  progress HEP with detailed cues as needed, revisit postural alignment for sitting and standing at work, Pt has physioball chair she can use ball from as well, progress supine core as she demos improved control of lumbar imprint, quadruped core strength and add diagonals to half kneel if able; progress hip adductor flexibility and strength  Morton Peters, PT 08/06/22 11:03 AM      [facility] [address], [address] [place], [place] [address] Phone # [phone] Fax [phone]

## 2022-08-10 ENCOUNTER — Encounter: Payer: Self-pay | Admitting: Rehabilitative and Restorative Service Providers"

## 2022-08-10 ENCOUNTER — Ambulatory Visit: Payer: BC Managed Care – PPO | Admitting: Rehabilitative and Restorative Service Providers"

## 2022-08-10 DIAGNOSIS — M5459 Other low back pain: Secondary | ICD-10-CM

## 2022-08-10 DIAGNOSIS — R252 Cramp and spasm: Secondary | ICD-10-CM | POA: Diagnosis not present

## 2022-08-10 DIAGNOSIS — M6281 Muscle weakness (generalized): Secondary | ICD-10-CM

## 2022-08-10 NOTE — Therapy (Signed)
OUTPATIENT PHYSICAL THERAPY TREATMENT NOTE   Patient Name: [name] [name] MRN: [id number] DOB:[birth year], 47 y.o., female Today's Date: 08/10/2022  END OF SESSION:  PT End of Session - 08/10/22 0843     Visit Number 8    Date for PT Re-Evaluation 08/18/22    PT Start Time 0841    PT Stop Time 0920    PT Time Calculation (min) 39 min    Activity Tolerance Patient tolerated treatment well    Behavior During Therapy [facility] for tasks assessed/performed                 Past Medical History:  Diagnosis Date   Allergy    Basal cell carcinoma    abdomen   Celiac disease 2009   Functional dyspepsia 09/05/2012   Gastritis    GERD (gastroesophageal reflux disease) 08/08/2012   Guillain Barr syndrome 04/01/2007   After influenza vaccine   Hyperthyroidism 04/01/2007   Hypoglycemia, unspecified 10/26/2007   Past Surgical History:  Procedure Laterality Date   CESAREAN SECTION     COLONOSCOPY  05/12/2007   Leone Payor   ESOPHAGOGASTRODUODENOSCOPY     SKIN CANCER EXCISION     abdomin   WISDOM TOOTH EXTRACTION     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   Fatigue 08/20/2021   Functional dyspepsia 09/05/2012   GERD (gastroesophageal reflux disease) 08/08/2012   Acne 08/08/2012   Hypoglycemia, unspecified 10/26/2007   Celiac disease 10/26/2007   Hyperthyroidism 04/01/2007   Guillain Barr syndrome 04/01/2007    PCP: [name], DO   REFERRING PROVIDER: [name], *   REFERRING DIAG:  [phone] (ICD-10-CM) - Bilateral hip pain  M54.42,M54.41,G89.29 (ICD-10-CM) - Chronic bilateral low back pain with bilateral sciatica    THERAPY DIAG:  Other low back pain  Muscle weakness (generalized)  Cramp and  spasm  Rationale for Evaluation and Treatment: Rehabilitation  ONSET DATE: 6-8 weeks ago  SUBJECTIVE:   SUBJECTIVE STATEMENT: Pt reports that she is having some discomfort, but is feeling better.  Pt reports that "my pain is better and my BP is down; that's how I can tell that I've improved."  PERTINENT HISTORY: LBP since 47 yo, Csection, GBS when she was in her 55's - mild nerve damage PAIN:  Are you having pain? Yes: NPRS scale: 1-2/10 Pain location: low back L>R into bil groin Pain description: constant ache Aggravating factors: prolonged sitting, pain in the morning with bed mobility Relieving factors: lying on back  PRECAUTIONS: None  WEIGHT BEARING RESTRICTIONS: No  FALLS:  Has patient fallen in last 6 months? No  LIVING ENVIRONMENT: Lives with: lives with their spouse Lives in: House/apartment Stairs:  no issue with stairs Has following equipment at home: None  OCCUPATION: desk job, works at home  PLOF: Independent  PATIENT GOALS: Get back to a state of comfort  NEXT MD VISIT: 6 months  OBJECTIVE:   DIAGNOSTIC FINDINGS:  DG LUMBAR: Normal anatomic alignment. No acute fracture or dislocation. L5-S1 degenerative disc disease. T11-12 degenerative disc disease. L4-5 and L5-S1 facet degenerative changes. SI joints unremarkable.  DG BIL HIPS - negative  PATIENT SURVEYS:  Eval:  FOTO 57 (goal 67) 08/10/2022:  FOTO 63%   COGNITION: Overall cognitive status: Within functional limits for tasks assessed  SENSATION: WFL  MUSCLE LENGTH: Hip Flexors:bil   POSTURE: decreased lumbar lordosis  PALPATION: Bil glute med/min  LOWER EXTREMITY ROM: WNL bil   LOWER EXTREMITY MMT: 5/5 Bil, significant core weakness when testing hip flexors, but patient able to perform advanced planks and side-planks.  LOWER EXTREMITY SPECIAL TESTS:  Hip special tests: Luisa Hart (FABER) test: negative    TODAY'S TREATMENT:                                                                                                                                DATE: 08/10/2022 Nustep level 5 x6 min with PT present to discuss status FOTO 63% Standing forward T with 5# kettle bell x10 bilat (with cuing to engage core) Cat/cow x 10, on last rep stay in cow and 10x tail wag, 10x quadupred rocking in lumbar ext Mini squat standing on flat side BOSU x 10 Fwd and lat step to lunge on rounded BOSU X 10 each LE 3 way counter "L" stretch 2x20 sec Trigger Point Dry-Needling  Treatment instructions: Expect mild to moderate muscle soreness. S/S of pneumothorax if dry needled over a lung field, and to seek immediate medical attention should they occur. Patient verbalized understanding of these instructions and education. Patient Consent Given: Yes Education handout provided: Previously provided Muscles treated: bilateral lumbar multifidi and bilateral glutes/piriformis Electrical stimulation performed: No Parameters: N/A Treatment response/outcome: Utilized skilled palpation to identify bony landmarks and trigger points.  Able to illicit twitch response and muscle elongation.  Soft tissue mobilization following to further promote tissue elongation.    08/06/22: Standing bil red tband shoulder extension and flexion from extension 10x5" each for postural awareness and core activation Mini squat standing on flat side BOSU x 10 2lb bil fwd/lat raise standing on flat side BOSU X10 Fwd and lat step to lunge on rounded BOSU X 10 each LE Standing T x10 each hold 5lb dumbbell Bwd resistance cable walking 15lb x 10 (gave black tband for HEP) Cat/cow x 10, on last rep stay in cow and 10x tail wag, 10x quadupred rocking in lumbar ext Standing backbend hands on hips x 5 - improved range without pain end of session Discussion of need for AM routine to do short walk, spine ROM, and loading dumbbell global neutral spine therex to loosen up and get blood flowing  DATE: 08/03/22 Elliptical x2' fwd PT  present to discuss status and plan for session Trigger Point Dry-Needling  Treatment instructions: Expect mild to moderate muscle soreness. S/S of pneumothorax if dry needled over a lung field, and to seek immediate medical attention should they occur. Patient verbalized understanding of these instructions and education.  Patient Consent Given: Yes Education handout provided: Yes Muscles treated: bil lumbar multifidi, bil glut med and piriformis Electrical stimulation performed: No Parameters: N/A Treatment response/outcome: deep ache and release STM lumbar paraspinals, manual traction in prone sacral distraction from L5, Lt thoracic paraspinal stripping and broadening, skin rolling  central lumbar spine, supine manual thoracic rotation bil (pull ribcage upward while standing on opp side) Open books with diaphragmatic breathing at end range each rep bil x 5 Supine diaphragmatic inhale with tactile cueing on bil lateral ribcage, exhale with open mouth "HAAAAA" taking voice from high pitch to low x 6 reps     PATIENT EDUCATION:  Education details: PT eval findings, anticipated POC, initial HEP, and role of DN  Person educated: Patient Education method: Explanation, Demonstration, and Handouts Education comprehension: verbalized understanding and returned demonstration  HOME EXERCISE PROGRAM: Access Code: [email or web address] URL: [email or web address] Date: 07/23/2022 Prepared by: Clydie Braun Gila Lauf  Exercises - Kneeling Adductor Stretch with Hip External Rotation  - 1 x daily - 7 x weekly - 1 sets - 1 reps - 60 sec hold - Half Kneeling Chop with Medicine Ball  - 1 x daily - 7 x weekly - 2 sets - 20 seconds hold - Half Kneeling Hip Flexor Stretch  - 1 x daily - 7 x weekly - 1 sets - 1 reps - 30-60" hold - Side Plank on Elbow  - 1 x daily - 7 x weekly - 3 sets - 20 seconds hold - Primal Push Up  - 1 x daily - 3-4 x weekly - 1 sets - 10 reps - max hold - Bird Dog  - 1 x daily - 7 x weekly  - 1 sets - 10 reps - Isometric Dead Bug  - 1 x daily - 7 x weekly - 1 sets - 3 reps - 20 sec hold - Modified Deadlift with Pelvic Contraction  - 1 x daily - 7 x weekly - 1 sets - 10 reps - Forward T with Weight  - 1 x daily - 7 x weekly - 1 sets - 10 reps  ASSESSMENT:  CLINICAL IMPRESSION: Ms Rubiano presents to skilled PT reporting that overall, she has made some progress, but continues to have pain.  States that she has more pain with sitting than with standing and walking.  Patient admits that she still has not been successful with starting a morning workout routine, but is going to try.  Patient educated on possible aquatic PT and state that she may be interested in some sessions, as she has access to a pool.  Patient has progressed with FOTO, but has not yet met.  Minimal verbal cuing throughout for improved technique.  Patient with good twitch response noted during dry needling and with good response and muscle elongation noted following manual therapy.  OBJECTIVE IMPAIRMENTS: decreased activity tolerance, decreased strength, increased muscle spasms, impaired flexibility, and pain.   ACTIVITY LIMITATIONS: sitting, sleeping, transfers, and bed mobility  PARTICIPATION LIMITATIONS:  all ADLS affected  PERSONAL FACTORS: Time since onset of injury/illness/exacerbation and 1 comorbidity: past GBS  are also affecting patient's functional outcome.   REHAB POTENTIAL: Excellent  CLINICAL DECISION MAKING: Stable/uncomplicated  EVALUATION COMPLEXITY: Low   GOALS: Goals reviewed with patient? Yes  SHORT TERM GOALS: Target date: 07/21/2022   Patient will be independent with initial HEP.  Baseline:  Goal status: MET on 07/23/2022  2.  Patient will report centralization of radicular symptoms by 07/28/22.  Baseline:  Goal status:  MET on 07/23/2022   LONG TERM GOALS: Target date: 08/18/2022   Patient will be independent with advanced/ongoing HEP to improve outcomes and carryover.  Baseline:   Goal status: IN PROGRESS  2.  Patient will report 75% improvement in low back pain with sitting, tranfers and bed mobility to improve  QOL.  Baseline:  Goal status: IN PROGRESS  3.  Patient will report 74 on lumbar FOTO to demonstrate improved functional ability.  Baseline: 57 Goal status: IN PROGRESS   4.  Patient will demonstrate improved core strength by ability to stabilze with MMT of hip flexors Baseline:  Goal status: IN PROGRESS   PLAN:  PT FREQUENCY: 2x/week  PT DURATION: 6 weeks  PLANNED INTERVENTIONS: Therapeutic exercises, Therapeutic activity, Neuromuscular re-education, Patient/Family education, Self Care, Joint mobilization, Dry Needling, Electrical stimulation, Spinal mobilization, Cryotherapy, Moist heat, Taping, Traction, and Manual therapy  PLAN FOR NEXT SESSION: f/u on dry needling, continue Lt thoracic STM, ribcage mobs and diaphragm release/breathing,  progress HEP with detailed cues as needed, revisit postural alignment for sitting and standing at work, Pt has physioball chair she can use ball from as well, progress supine core as she demos improved control of lumbar imprint, quadruped core strength and add diagonals to half kneel if able; progress hip adductor flexibility and strength     Reather Laurence, PT 08/10/22 9:56 AM   [facility] [address] [place], [place] [address] Phone # [phone] Fax [phone]

## 2022-08-14 ENCOUNTER — Ambulatory Visit: Payer: BC Managed Care – PPO | Admitting: Rehabilitative and Restorative Service Providers"

## 2022-08-14 ENCOUNTER — Encounter: Payer: Self-pay | Admitting: Rehabilitative and Restorative Service Providers"

## 2022-08-14 DIAGNOSIS — M6281 Muscle weakness (generalized): Secondary | ICD-10-CM | POA: Diagnosis not present

## 2022-08-14 DIAGNOSIS — M5459 Other low back pain: Secondary | ICD-10-CM | POA: Diagnosis not present

## 2022-08-14 DIAGNOSIS — R252 Cramp and spasm: Secondary | ICD-10-CM

## 2022-08-14 NOTE — Therapy (Signed)
OUTPATIENT PHYSICAL THERAPY TREATMENT NOTE   Patient Name: [name] [name] MRN: [id number] DOB:1975-10-09, 47 y.o., female Today's Date: 08/14/2022  END OF SESSION:  PT End of Session - 08/14/22 0952     Visit Number 9    Date for PT Re-Evaluation 08/18/22    PT Start Time 0943   Pt arrived late for appointment   PT Stop Time 1015    PT Time Calculation (min) 32 min    Activity Tolerance Patient tolerated treatment well    Behavior During Therapy [facility] for tasks assessed/performed                 Past Medical History:  Diagnosis Date   Allergy    Basal cell carcinoma    abdomen   Celiac disease 2009   Functional dyspepsia 09/05/2012   Gastritis    GERD (gastroesophageal reflux disease) 08/08/2012   Guillain Barr syndrome 04/01/2007   After influenza vaccine   Hyperthyroidism 04/01/2007   Hypoglycemia, unspecified 10/26/2007   Past Surgical History:  Procedure Laterality Date   CESAREAN SECTION     COLONOSCOPY  05/12/2007   Leone Payor   ESOPHAGOGASTRODUODENOSCOPY     SKIN CANCER EXCISION     abdomin   WISDOM TOOTH EXTRACTION     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   Fatigue 08/20/2021   Functional dyspepsia 09/05/2012   GERD (gastroesophageal reflux disease) 08/08/2012   Acne 08/08/2012   Hypoglycemia, unspecified 10/26/2007   Celiac disease 10/26/2007   Hyperthyroidism 04/01/2007   Guillain Barr syndrome 04/01/2007    PCP: [name], DO   REFERRING PROVIDER: [name], *   REFERRING DIAG:  [phone] (ICD-10-CM) - Bilateral hip pain  M54.42,M54.41,G89.29 (ICD-10-CM) - Chronic bilateral low back pain with bilateral sciatica    THERAPY DIAG:  Other low back pain  Muscle  weakness (generalized)  Cramp and spasm  Rationale for Evaluation and Treatment: Rehabilitation  ONSET DATE: 6-8 weeks ago  SUBJECTIVE:   SUBJECTIVE STATEMENT: Pt reports some soreness after last PT session, but then has been feeling "great since then."  States that she was able to go on a long walk and has had little to no pain for the past 2+ days.  PERTINENT HISTORY: LBP since 47 yo, Csection, GBS when she was in her 89's - mild nerve damage PAIN:  Are you having pain? Yes: NPRS scale: 0-1/10 Pain location: low back L>R into bil groin Pain description: constant ache Aggravating factors: prolonged sitting, pain in the morning with bed mobility Relieving factors: lying on back  PRECAUTIONS: None  WEIGHT BEARING RESTRICTIONS: No  FALLS:  Has patient fallen in last 6 months? No  LIVING ENVIRONMENT: Lives with: lives with their spouse Lives in: House/apartment Stairs:  no issue with stairs Has following equipment at home: None  OCCUPATION: desk job, works at home  PLOF: Independent  PATIENT GOALS: Get back to a state of comfort  NEXT MD VISIT: 6 months  OBJECTIVE:   DIAGNOSTIC FINDINGS:  DG LUMBAR: Normal anatomic alignment. No acute fracture or dislocation. L5-S1 degenerative disc disease. T11-12 degenerative disc disease. L4-5 and L5-S1 facet degenerative changes. SI joints unremarkable.  DG BIL HIPS - negative  PATIENT SURVEYS:  Eval:  FOTO 57 (goal 67) 08/10/2022:  FOTO  63%   COGNITION: Overall cognitive status: Within functional limits for tasks assessed     SENSATION: WFL  MUSCLE LENGTH: Hip Flexors:bil   POSTURE: decreased lumbar lordosis  PALPATION: Bil glute med/min  LOWER EXTREMITY ROM: WNL bil   LOWER EXTREMITY MMT: 5/5 Bil, significant core weakness when testing hip flexors, but patient able to perform advanced planks and side-planks.  LOWER EXTREMITY SPECIAL TESTS:  Hip special tests: Luisa Hart (FABER) test: negative    TODAY'S  TREATMENT:                                                                                                                               DATE: 08/14/2022 Nustep level 5 x6 min with PT present to discuss status Cat/cow x 10, on last rep stay in cow and 10x tail wag, 10x quadupred rocking in lumbar ext Standing forward T with 5# kettle bell x10 bilat (with cuing to engage core) Diagonal to half kneel with 5# cable pulley 2x10 bilat Standing in mini squat with unilateral alt foot tap to bosu in front of patient (with cuing for maintaining core and posture) 2x10 bilat Seated hamstring stretch 2x20 sec bilat Seated piriformis stretch 2x20 sec bilat Squats to 24 inch RITFIT box x10   DATE: 08/10/2022 Nustep level 5 x6 min with PT present to discuss status FOTO 63% Standing forward T with 5# kettle bell x10 bilat (with cuing to engage core) Cat/cow x 10, on last rep stay in cow and 10x tail wag, 10x quadupred rocking in lumbar ext Mini squat standing on flat side BOSU x 10 Fwd and lat step to lunge on rounded BOSU X 10 each LE 3 way counter "L" stretch 2x20 sec Trigger Point Dry-Needling  Treatment instructions: Expect mild to moderate muscle soreness. S/S of pneumothorax if dry needled over a lung field, and to seek immediate medical attention should they occur. Patient verbalized understanding of these instructions and education. Patient Consent Given: Yes Education handout provided: Previously provided Muscles treated: bilateral lumbar multifidi and bilateral glutes/piriformis Electrical stimulation performed: No Parameters: N/A Treatment response/outcome: Utilized skilled palpation to identify bony landmarks and trigger points.  Able to illicit twitch response and muscle elongation.  Soft tissue mobilization following to further promote tissue elongation.    08/06/22: Standing bil red tband shoulder extension and flexion from extension 10x5" each for postural awareness and core  activation Mini squat standing on flat side BOSU x 10 2lb bil fwd/lat raise standing on flat side BOSU X10 Fwd and lat step to lunge on rounded BOSU X 10 each LE Standing T x10 each hold 5lb dumbbell Bwd resistance cable walking 15lb x 10 (gave black tband for HEP) Cat/cow x 10, on last rep stay in cow and 10x tail wag, 10x quadupred rocking in lumbar ext Standing backbend hands on hips x 5 - improved range without pain end of session Discussion of need for AM routine to do short walk, spine ROM,  and loading dumbbell global neutral spine therex to loosen up and get blood flowing      PATIENT EDUCATION:  Education details: PT eval findings, anticipated POC, initial HEP, and role of DN  Person educated: Patient Education method: Explanation, Demonstration, and Handouts Education comprehension: verbalized understanding and returned demonstration  HOME EXERCISE PROGRAM: Access Code: [email or web address] URL: [email or web address] Date: 07/23/2022 Prepared by: Clydie Braun Carmita Boom  Exercises - Kneeling Adductor Stretch with Hip External Rotation  - 1 x daily - 7 x weekly - 1 sets - 1 reps - 60 sec hold - Half Kneeling Chop with Medicine Ball  - 1 x daily - 7 x weekly - 2 sets - 20 seconds hold - Half Kneeling Hip Flexor Stretch  - 1 x daily - 7 x weekly - 1 sets - 1 reps - 30-60" hold - Side Plank on Elbow  - 1 x daily - 7 x weekly - 3 sets - 20 seconds hold - Primal Push Up  - 1 x daily - 3-4 x weekly - 1 sets - 10 reps - max hold - Bird Dog  - 1 x daily - 7 x weekly - 1 sets - 10 reps - Isometric Dead Bug  - 1 x daily - 7 x weekly - 1 sets - 3 reps - 20 sec hold - Modified Deadlift with Pelvic Contraction  - 1 x daily - 7 x weekly - 1 sets - 10 reps - Forward T with Weight  - 1 x daily - 7 x weekly - 1 sets - 10 reps  ASSESSMENT:  CLINICAL IMPRESSION: Patient presented to clinic with no current low back pain for the last few days. Today's session focused on higher level resistance  training in the sagittal plane targeting the hip extensors, abductors, and abdominals in order to help improve the patients muscular endurance for her goal to return back to the gym. Likewise, the patient responded well to piriformis and hamstring stretching and additional education on breath work during more intensive exercises.  Patient educated on the importance of ensuring that she is feeling the correct muscles contracting when she is exercising and to check her technique or taking a recovery period if she starts to feel the incorrect accessory muscles activating.  OBJECTIVE IMPAIRMENTS: decreased activity tolerance, decreased strength, increased muscle spasms, impaired flexibility, and pain.   ACTIVITY LIMITATIONS: sitting, sleeping, transfers, and bed mobility  PARTICIPATION LIMITATIONS:  all ADLS affected  PERSONAL FACTORS: Time since onset of injury/illness/exacerbation and 1 comorbidity: past GBS  are also affecting patient's functional outcome.   REHAB POTENTIAL: Excellent  CLINICAL DECISION MAKING: Stable/uncomplicated  EVALUATION COMPLEXITY: Low   GOALS: Goals reviewed with patient? Yes  SHORT TERM GOALS: Target date: 07/21/2022   Patient will be independent with initial HEP.  Baseline:  Goal status: MET on 07/23/2022  2.  Patient will report centralization of radicular symptoms by 07/28/22.  Baseline:  Goal status:  MET on 07/23/2022   LONG TERM GOALS: Target date: 08/18/2022   Patient will be independent with advanced/ongoing HEP to improve outcomes and carryover.  Baseline:  Goal status: IN PROGRESS  2.  Patient will report 75% improvement in low back pain with sitting, tranfers and bed mobility to improve QOL.  Baseline:  Goal status: IN PROGRESS  3.  Patient will report 62 on lumbar FOTO to demonstrate improved functional ability.  Baseline: 57 Goal status: IN PROGRESS   4.  Patient will demonstrate improved core strength by ability to stabilze with MMT  of hip  flexors Baseline:  Goal status: IN PROGRESS   PLAN:  PT FREQUENCY: 2x/week  PT DURATION: 6 weeks  PLANNED INTERVENTIONS: Therapeutic exercises, Therapeutic activity, Neuromuscular re-education, Patient/Family education, Self Care, Joint mobilization, Dry Needling, Electrical stimulation, Spinal mobilization, Cryotherapy, Moist heat, Taping, Traction, and Manual therapy  PLAN FOR NEXT SESSION: Reassessment, continue Lt thoracic STM, ribcage mobs and diaphragm release/breathing,  progress HEP with detailed cues as needed, revisit postural alignment for sitting and standing at work, Pt has physioball chair she can use ball from as well, progress supine core as she demos improved control of lumbar imprint, quadruped core strength; progress hip adductor flexibility and strength    Meghan Crowfoot, SPT Reather Laurence, PT, DPT 08/14/22 10:31 AM   [facility] [address] [place], [place] [address] Phone # [phone] Fax [phone]

## 2022-08-17 ENCOUNTER — Encounter: Payer: Self-pay | Admitting: Rehabilitative and Restorative Service Providers"

## 2022-08-17 ENCOUNTER — Ambulatory Visit: Payer: BC Managed Care – PPO | Attending: Family Medicine | Admitting: Rehabilitative and Restorative Service Providers"

## 2022-08-17 DIAGNOSIS — R252 Cramp and spasm: Secondary | ICD-10-CM | POA: Diagnosis not present

## 2022-08-17 DIAGNOSIS — M6281 Muscle weakness (generalized): Secondary | ICD-10-CM | POA: Insufficient documentation

## 2022-08-17 DIAGNOSIS — M5459 Other low back pain: Secondary | ICD-10-CM | POA: Diagnosis not present

## 2022-08-17 NOTE — Therapy (Signed)
OUTPATIENT PHYSICAL THERAPY TREATMENT NOTE AND DISCHARGE SUMMARY   Patient Name: [name] [name] MRN: [id number] DOB:14-Jan-1976, 47 y.o., female Today's Date: 08/17/2022  END OF SESSION:  PT End of Session - 08/17/22 1256     Visit Number 10    Date for PT Re-Evaluation 08/18/22    PT Start Time 1230    PT Stop Time 1310    PT Time Calculation (min) 40 min    Activity Tolerance Patient tolerated treatment well    Behavior During Therapy [facility] for tasks assessed/performed                 Past Medical History:  Diagnosis Date   Allergy    Basal cell carcinoma    abdomen   Celiac disease 2009   Functional dyspepsia 09/05/2012   Gastritis    GERD (gastroesophageal reflux disease) 08/08/2012   Guillain Barr syndrome 04/01/2007   After influenza vaccine   Hyperthyroidism 04/01/2007   Hypoglycemia, unspecified 10/26/2007   Past Surgical History:  Procedure Laterality Date   CESAREAN SECTION     COLONOSCOPY  05/12/2007   Leone Payor   ESOPHAGOGASTRODUODENOSCOPY     SKIN CANCER EXCISION     abdomin   WISDOM TOOTH EXTRACTION     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   Fatigue 08/20/2021   Functional dyspepsia 09/05/2012   GERD (gastroesophageal reflux disease) 08/08/2012   Acne 08/08/2012   Hypoglycemia, unspecified 10/26/2007   Celiac disease 10/26/2007   Hyperthyroidism 04/01/2007   Guillain Barr syndrome 04/01/2007    PCP: [name], DO   REFERRING PROVIDER: [name], *   REFERRING DIAG:  [phone] (ICD-10-CM) - Bilateral hip pain  M54.42,M54.41,G89.29 (ICD-10-CM) - Chronic bilateral low back pain with bilateral sciatica    THERAPY DIAG:  Other low back pain  Muscle weakness  (generalized)  Cramp and spasm  Rationale for Evaluation and Treatment: Rehabilitation  ONSET DATE: 6-8 weeks ago  SUBJECTIVE:   SUBJECTIVE STATEMENT: Pt reports that overall, her pain has improved at least 80%.  States that she plans to reach out to MD for possible CT scan vs MRI for further assessment.  PERTINENT HISTORY: LBP since 47 yo, Csection, GBS when she was in her 79's - mild nerve damage PAIN:  Are you having pain? Yes: NPRS scale: 0-1/10 Pain location: low back L>R into bil groin Pain description: constant ache Aggravating factors: prolonged sitting, pain in the morning with bed mobility Relieving factors: lying on back  PRECAUTIONS: None  WEIGHT BEARING RESTRICTIONS: No  FALLS:  Has patient fallen in last 6 months? No  LIVING ENVIRONMENT: Lives with: lives with their spouse Lives in: House/apartment Stairs:  no issue with stairs Has following equipment at home: None  OCCUPATION: desk job, works at home  PLOF: Independent  PATIENT GOALS: Get back to a state of comfort  NEXT MD VISIT: 6 months  OBJECTIVE:   DIAGNOSTIC FINDINGS:  DG LUMBAR: Normal anatomic alignment. No acute fracture or dislocation. L5-S1 degenerative disc disease. T11-12 degenerative disc disease. L4-5 and L5-S1 facet degenerative changes. SI joints unremarkable.  DG BIL HIPS - negative  PATIENT SURVEYS:  Eval:  FOTO 57 (goal 67) 08/10/2022:  FOTO 63% 08/17/2022:  FOTO 74%   COGNITION: Overall cognitive status: Within functional  limits for tasks assessed     SENSATION: WFL  MUSCLE LENGTH: Hip Flexors:bil   POSTURE: decreased lumbar lordosis  PALPATION: Bil glute med/min  LOWER EXTREMITY ROM: WNL bil   LOWER EXTREMITY MMT: 5/5 Bil, significant core weakness when testing hip flexors, but patient able to perform advanced planks and side-planks.  LOWER EXTREMITY SPECIAL TESTS:  Hip special tests: Luisa Hart (FABER) test: negative    TODAY'S TREATMENT:                                                                                                                                DATE: 08/17/2022 Standing quad stretch with UE support 2x20 sec bilat Recumbent bike level 3 x6 min with PT present to discuss status Cat/cow x 10, on last rep stay in cow and 10x tail wag, 10x quadupred rocking in lumbar ext Seated hamstring stretch 2x20 sec bilat Seated piriformis stretch 2x20 sec bilat Standing forward T with 5# kettle bell 2x10 bilat Diagonal to half kneel with 5# cable pulley 2x10 bilat Squats to 24 inch RITFIT box 2x10 Standing in mini squat with unilateral alt foot tap to bosu in front of patient (with cuing for maintaining core and posture) 2x10 bilat   DATE: 08/14/2022 Nustep level 5 x6 min with PT present to discuss status Cat/cow x 10, on last rep stay in cow and 10x tail wag, 10x quadupred rocking in lumbar ext Standing forward T with 5# kettle bell x10 bilat (with cuing to engage core) Diagonal to half kneel with 5# cable pulley 2x10 bilat Standing in mini squat with unilateral alt foot tap to bosu in front of patient (with cuing for maintaining core and posture) 2x10 bilat Seated hamstring stretch 2x20 sec bilat Seated piriformis stretch 2x20 sec bilat Squats to 24 inch RITFIT box x10   DATE: 08/10/2022 Nustep level 5 x6 min with PT present to discuss status FOTO 63% Standing forward T with 5# kettle bell x10 bilat (with cuing to engage core) Cat/cow x 10, on last rep stay in cow and 10x tail wag, 10x quadupred rocking in lumbar ext Mini squat standing on flat side BOSU x 10 Fwd and lat step to lunge on rounded BOSU X 10 each LE 3 way counter "L" stretch 2x20 sec Trigger Point Dry-Needling  Treatment instructions: Expect mild to moderate muscle soreness. S/S of pneumothorax if dry needled over a lung field, and to seek immediate medical attention should they occur. Patient verbalized understanding of these instructions and education. Patient  Consent Given: Yes Education handout provided: Previously provided Muscles treated: bilateral lumbar multifidi and bilateral glutes/piriformis Electrical stimulation performed: No Parameters: N/A Treatment response/outcome: Utilized skilled palpation to identify bony landmarks and trigger points.  Able to illicit twitch response and muscle elongation.  Soft tissue mobilization following to further promote tissue elongation.       PATIENT EDUCATION:  Education details: PT eval findings, anticipated POC, initial HEP, and role of DN  Person educated: Patient Education method: Explanation, Demonstration, and Handouts Education comprehension: verbalized understanding and returned demonstration  HOME EXERCISE PROGRAM: Access Code: [email or web address] URL: [email or web address] Date: 07/23/2022 Prepared by: Clydie Braun Quante Pettry  Exercises - Kneeling Adductor Stretch with Hip External Rotation  - 1 x daily - 7 x weekly - 1 sets - 1 reps - 60 sec hold - Half Kneeling Chop with Medicine Ball  - 1 x daily - 7 x weekly - 2 sets - 20 seconds hold - Half Kneeling Hip Flexor Stretch  - 1 x daily - 7 x weekly - 1 sets - 1 reps - 30-60" hold - Side Plank on Elbow  - 1 x daily - 7 x weekly - 3 sets - 20 seconds hold - Primal Push Up  - 1 x daily - 3-4 x weekly - 1 sets - 10 reps - max hold - Bird Dog  - 1 x daily - 7 x weekly - 1 sets - 10 reps - Isometric Dead Bug  - 1 x daily - 7 x weekly - 1 sets - 3 reps - 20 sec hold - Modified Deadlift with Pelvic Contraction  - 1 x daily - 7 x weekly - 1 sets - 10 reps - Forward T with Weight  - 1 x daily - 7 x weekly - 1 sets - 10 reps  ASSESSMENT:  CLINICAL IMPRESSION: [name] [name] presents to skilled PT reporting that she has made at least 80% improvement since initial PT.  Patient has met all short term and long term goals at this time with improved FOTO score noted.  Patient still having some underlying pain if she does too much or is not cautious of her  body mechanics, so educated patient to reach out to MD about possible further assessment/imaging, she verbalizes her understanding.  Patient has improved with body mechanics and demonstrates improved posture and core stability with exercises.  Patient to be discharged from skilled PT to continue with HEP and follow up with MD as needed.  OBJECTIVE IMPAIRMENTS: decreased activity tolerance, decreased strength, increased muscle spasms, impaired flexibility, and pain.   ACTIVITY LIMITATIONS: sitting, sleeping, transfers, and bed mobility  PARTICIPATION LIMITATIONS:  all ADLS affected  PERSONAL FACTORS: Time since onset of injury/illness/exacerbation and 1 comorbidity: past GBS  are also affecting patient's functional outcome.   REHAB POTENTIAL: Excellent  CLINICAL DECISION MAKING: Stable/uncomplicated  EVALUATION COMPLEXITY: Low   GOALS: Goals reviewed with patient? Yes  SHORT TERM GOALS: Target date: 07/21/2022   Patient will be independent with initial HEP.  Baseline:  Goal status: MET on 07/23/2022  2.  Patient will report centralization of radicular symptoms by 07/28/22.  Baseline:  Goal status:  MET on 07/23/2022   LONG TERM GOALS: Target date: 08/18/2022   Patient will be independent with advanced/ongoing HEP to improve outcomes and carryover.  Baseline:  Goal status: MET  2.  Patient will report 75% improvement in low back pain with sitting, tranfers and bed mobility to improve QOL.  Baseline:  Goal status: MET  3.  Patient will report 44 on lumbar FOTO to demonstrate improved functional ability.  Baseline: 57 Goal status: MET  4.  Patient will demonstrate improved core strength by ability to stabilze with MMT of hip flexors Baseline:  Goal status: MET   PLAN:  PT FREQUENCY: 2x/week  PT DURATION: 6 weeks  PLANNED INTERVENTIONS: Therapeutic exercises, Therapeutic activity, Neuromuscular re-education, Patient/Family education, Self Care, Joint mobilization, Dry  Needling, Electrical stimulation, Spinal mobilization, Cryotherapy, Moist  heat, Taping, Traction, and Manual therapy    PHYSICAL THERAPY DISCHARGE SUMMARY  Patient agrees to discharge. Patient goals were met. Patient is being discharged due to meeting the stated rehab goals.     Ailene Ards, SPT Reather Laurence, PT, DPT 08/17/22 12:57 PM   [facility] [address], [address] [place], [place] [address] Phone # [phone] Fax [phone]

## 2022-08-18 ENCOUNTER — Encounter: Payer: Self-pay | Admitting: Family Medicine

## 2022-08-18 ENCOUNTER — Ambulatory Visit: Payer: BC Managed Care – PPO | Admitting: Family Medicine

## 2022-08-18 VITALS — BP 130/82 | HR 64 | Temp 98.5°F | Resp 12 | Ht 66.0 in | Wt 139.0 lb

## 2022-08-18 DIAGNOSIS — M5441 Lumbago with sciatica, right side: Secondary | ICD-10-CM | POA: Diagnosis not present

## 2022-08-18 DIAGNOSIS — M5442 Lumbago with sciatica, left side: Secondary | ICD-10-CM | POA: Diagnosis not present

## 2022-08-18 NOTE — Progress Notes (Signed)
Established Patient Office Visit  Subjective   Patient ID: [name] [name], female    DOB: Sep 12, 1975  Age: 47 y.o. MRN: [id number]  Chief Complaint  Patient presents with   Back Pain   bilateral hip pain    HPI Discussed the use of AI scribe software for clinical note transcription with the patient, who gave verbal consent to proceed.  History of Present Illness   The patient, with a history of lower back pain, reports that despite physical therapy, they are not yet at 100%. They describe the pain as originating in the lower back and radiating up the spine, forming an upside-down T. The pain is constant, but worsens when they are not able to maintain their strength and flexibility. They also report that their hips "keep coming and going," which they believe is more muscle-related. They have experienced shooting pain down the back of their left side when arching their back. They deny leg weakness but describe an "electrical" sensation. They have been managing the pain with over-the-counter Advil and Aleve, and have not needed prescription pain medication.  In addition to the back pain, the patient mentions a knot in their groin that has become more noticeable since starting physical therapy. This knot was previously evaluated in 2015 and determined to be a reactive lymph node. The patient reports that it is not currently causing significant discomfort.      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   Fatigue 08/20/2021   Functional dyspepsia 09/05/2012   GERD (gastroesophageal reflux disease) 08/08/2012   Acne 08/08/2012   Hypoglycemia, unspecified 10/26/2007   Celiac disease 10/26/2007   Hyperthyroidism 04/01/2007   Guillain Barr syndrome 04/01/2007    Past Medical History:  Diagnosis Date   Allergy    Basal cell carcinoma    abdomen   Celiac disease 2009   Functional dyspepsia 09/05/2012   Gastritis    GERD (gastroesophageal reflux disease) 08/08/2012   Guillain Barr syndrome 04/01/2007   After influenza vaccine   Hyperthyroidism 04/01/2007   Hypoglycemia, unspecified 10/26/2007   Past Surgical History:  Procedure Laterality Date   CESAREAN SECTION     COLONOSCOPY  05/12/2007   Leone Payor   ESOPHAGOGASTRODUODENOSCOPY     SKIN CANCER EXCISION     abdomin   WISDOM TOOTH EXTRACTION     Social History   Tobacco Use   Smoking status: Former    Types: Cigarettes    Quit date: 01/19/1998    Years since quitting: 24.5   Smokeless tobacco: Never   Tobacco comments:    About age 16   Vaping Use   Vaping Use: Never used  Substance Use Topics   Alcohol use: Yes    Alcohol/week: 4.0 - 8.0 standard drinks of alcohol    Types: 4 - 8 Glasses of wine per week   Drug use: Yes    Comment: CBD   Social History   Socioeconomic History   Marital status: Married    Spouse name: [name]   Number of children: 2   Years of education: 16   Highest education level: Bachelor's degree (e.g., BA, AB, BS)  Occupational History   Occupation: Social worker: [employer]  Tobacco Use   Smoking status: Former    Types:  Cigarettes    Quit date: 01/19/1998    Years since quitting: 24.5   Smokeless tobacco: Never   Tobacco comments:    About age 43   Vaping Use   Vaping Use: Never used  Substance and Sexual Activity   Alcohol use: Yes    Alcohol/week: 4.0 - 8.0 standard drinks of alcohol    Types: 4 - 8 Glasses of wine per week   Drug use: Yes    Comment: CBD   Sexual activity: Yes    Partners: Male  Other Topics Concern   Not on file  Social History Narrative   Married with 2 children   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 [place]   Occasional alcohol no drugs or tobacco use at this time    Exercise-- 2 x a week    R handed   Caffeine: a C of tea and Coffee a day   Social Determinants of Health   Financial Resource Strain: Low Risk  (06/04/2022)   Overall Financial Resource Strain (CARDIA)    Difficulty of Paying Living Expenses: Not very hard  Food Insecurity: No Food Insecurity (06/04/2022)   Hunger Vital Sign    Worried About Running Out of Food in the Last Year: Never true    Ran Out of Food in the Last Year: Never true  Transportation Needs: No Transportation Needs (06/04/2022)   PRAPARE - Administrator, Civil Service (Medical): No    Lack of Transportation (Non-Medical): No  Physical Activity: Insufficiently Active (06/04/2022)   Exercise Vital Sign    Days of Exercise per Week: 2 days    Minutes of Exercise per Session: 30 min  Stress: Stress Concern Present (06/04/2022)   [employer] of Occupational Health - Occupational Stress Questionnaire    Feeling of Stress : To some extent  Social Connections: Socially Isolated (06/04/2022)   Social Connection and Isolation Panel [NHANES]    Frequency of Communication with Friends and Family: Never    Frequency of Social Gatherings with Friends and Family: Twice a week    Attends Religious Services: Never    Diplomatic Services operational officer: No    Attends Engineer, structural: Not on file    Marital Status: Married  Catering manager Violence: Not on file   Family Status  Relation Name Status   Mother  Deceased   Father  Alive   Sister  Alive   MGM  Deceased   Cousin  (Not Specified)   Cousin  (Not Specified)   Other  (Not Specified)   Neg Hx  (Not Specified)   Family History  Problem Relation Age of Onset   Leukemia Mother 62   Hypertension Mother    Stroke Mother    Memory loss Mother    Hypertension Father    Hyperlipidemia Father    Diabetes Father    Prostate cancer Father 102   Depression Father    Hypertension Sister    Bipolar disorder Sister    Stroke Maternal  Grandmother    Multiple sclerosis Cousin    Lupus Cousin    Hypertension Other        siblings   Autoimmune disease Other        numerous family members on her mother's side   Colon cancer Neg Hx    Esophageal cancer Neg Hx    Stomach cancer Neg Hx    Rectal cancer Neg Hx    Colon polyps Neg  Hx    Allergies  Allergen Reactions   Gluten Meal Other (See Comments)   Influenza Vac Split Quad     REACTION: pt had Drema Balzarine--- can not have flu shot      ROS    Objective:     BP 130/82 (BP Location: Left Arm, Cuff Size: Normal)   Pulse 64   Temp 98.5 F (36.9 C) (Oral)   Resp 12   Ht 5\' 6"  (1.676 m)   Wt 139 lb (63 kg)   SpO2 99%   BMI 22.44 kg/m  BP Readings from Last 3 Encounters:  08/18/22 130/82  06/25/22 118/80  06/04/22 (!) 148/100   Wt Readings from Last 3 Encounters:  08/18/22 139 lb (63 kg)  06/25/22 139 lb (63 kg)  06/04/22 140 lb 3.2 oz (63.6 kg)   SpO2 Readings from Last 3 Encounters:  08/18/22 99%  06/25/22 97%  06/04/22 98%      Physical Exam   No results found for any visits on 08/18/22.  Last CBC Lab Results  Component Value Date   WBC 6.0 05/07/2022   HGB 14.3 05/07/2022   HCT 42.2 05/07/2022   MCV 91.7 05/07/2022   RDW 13.3 05/07/2022   PLT 387.0 05/07/2022   Last metabolic panel Lab Results  Component Value Date   GLUCOSE 85 06/25/2022   NA 139 06/25/2022   K 4.6 06/25/2022   CL 103 06/25/2022   CO2 27 06/25/2022   BUN 12 06/25/2022   CREATININE 0.82 06/25/2022   GFR 86.04 06/25/2022   CALCIUM 9.6 06/25/2022   PHOS 3.0 04/25/2008   PROT 6.9 06/25/2022   ALBUMIN 4.1 06/25/2022   BILITOT 0.5 06/25/2022   ALKPHOS 39 06/25/2022   AST 17 06/25/2022   ALT 15 06/25/2022   Last lipids Lab Results  Component Value Date   CHOL 176 05/07/2022   HDL 74.30 05/07/2022   LDLCALC 93 05/07/2022   TRIG 44.0 05/07/2022   CHOLHDL 2 05/07/2022   Last hemoglobin A1c No results found for: "HGBA1C" Last thyroid  functions Lab Results  Component Value Date   TSH 1.65 05/07/2022   Last vitamin D Lab Results  Component Value Date   VD25OH 31.40 08/16/2013   Last vitamin B12 and Folate Lab Results  Component Value Date   VITAMINB12 285 03/15/2020   FOLATE > 20.0 ng/mL 04/25/2008      The 10-year ASCVD risk score (Arnett DK, et al., 2019) is: 0.6%    Assessment & Plan:   Problem List Items Addressed This Visit   None Visit Diagnoses     Acute midline low back pain with bilateral sciatica    -  Primary   Relevant Orders   MR Lumbar Spine Wo Contrast     Pt has been to pt and it has improved only slightly  Assessment and Plan    Lower Back Pain: Persistent despite physical therapy. Pain radiates to glutes and occasionally down the left leg. No motor weakness. Physical therapy has helped but not resolved the issue. Physical therapist recommended imaging. -Order MRI of the lower back at [facility]. -Continue current pain management with over-the-counter Advil and Aleve as needed.  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 No palpable mass at this time. -Continue to monitor. If it enlarges or causes more discomfort, consider reimaging and referral to a surgeon.        No follow-ups on  file.    [name], DO

## 2022-08-19 ENCOUNTER — Other Ambulatory Visit: Payer: Self-pay | Admitting: Family Medicine

## 2022-08-19 DIAGNOSIS — M5442 Lumbago with sciatica, left side: Secondary | ICD-10-CM

## 2022-08-19 MED ORDER — CYCLOBENZAPRINE HCL 10 MG PO TABS
10.0000 mg | ORAL_TABLET | Freq: Three times a day (TID) | ORAL | 0 refills | Status: DC | PRN
Start: 2022-08-19 — End: 2023-05-18

## 2022-08-26 ENCOUNTER — Ambulatory Visit (HOSPITAL_COMMUNITY): Payer: BC Managed Care – PPO

## 2022-08-27 ENCOUNTER — Other Ambulatory Visit: Payer: Self-pay | Admitting: Family Medicine

## 2022-08-27 ENCOUNTER — Ambulatory Visit (HOSPITAL_COMMUNITY)
Admission: RE | Admit: 2022-08-27 | Discharge: 2022-08-27 | Disposition: A | Discharge status: Home / Self Care | Payer: BC Managed Care – PPO | Source: Ambulatory Visit

## 2022-08-27 ENCOUNTER — Ambulatory Visit (HOSPITAL_COMMUNITY)
Admission: RE | Admit: 2022-08-27 | Discharge: 2022-08-27 | Disposition: A | Discharge status: Home / Self Care | Payer: BC Managed Care – PPO | Source: Ambulatory Visit | Attending: Family Medicine | Admitting: Family Medicine

## 2022-08-27 DIAGNOSIS — M5441 Lumbago with sciatica, right side: Secondary | ICD-10-CM | POA: Diagnosis not present

## 2022-08-27 DIAGNOSIS — M5442 Lumbago with sciatica, left side: Secondary | ICD-10-CM

## 2022-08-27 DIAGNOSIS — Z1389 Encounter for screening for other disorder: Secondary | ICD-10-CM | POA: Diagnosis not present

## 2022-08-27 DIAGNOSIS — M5117 Intervertebral disc disorders with radiculopathy, lumbosacral region: Secondary | ICD-10-CM | POA: Diagnosis not present

## 2022-09-04 ENCOUNTER — Other Ambulatory Visit: Payer: Self-pay

## 2022-09-04 DIAGNOSIS — M5441 Lumbago with sciatica, right side: Secondary | ICD-10-CM

## 2022-09-13 DIAGNOSIS — K638219 Small intestinal bacterial overgrowth, unspecified: Secondary | ICD-10-CM | POA: Insufficient documentation

## 2022-09-13 HISTORY — DX: Small intestinal bacterial overgrowth, unspecified: K63.8219

## 2022-09-14 ENCOUNTER — Ambulatory Visit: Payer: BC Managed Care – PPO | Admitting: Family Medicine

## 2022-09-14 ENCOUNTER — Encounter: Payer: Self-pay | Admitting: Family Medicine

## 2022-09-14 VITALS — BP 140/80 | HR 80 | Temp 98.4°F | Resp 18 | Ht 66.0 in | Wt 136.0 lb

## 2022-09-14 DIAGNOSIS — A09 Infectious gastroenteritis and colitis, unspecified: Secondary | ICD-10-CM

## 2022-09-14 HISTORY — DX: Infectious gastroenteritis and colitis, unspecified: A09

## 2022-09-14 LAB — POC COVID19 BINAXNOW: SARS Coronavirus 2 Ag: NEGATIVE

## 2022-09-14 MED ORDER — AZITHROMYCIN 500 MG PO TABS
500.0000 mg | ORAL_TABLET | Freq: Every day | ORAL | 0 refills | Status: DC
Start: 2022-09-14 — End: 2022-12-24

## 2022-09-14 NOTE — Assessment & Plan Note (Signed)
Zithromax 500 mg 2 po x1 Stool studies  If zithromax does not help ,  can try bentyl and gi referral

## 2022-09-14 NOTE — Progress Notes (Signed)
Established Patient Office Visit  Subjective   Patient ID: [name] [name], female    DOB: 1975-08-29  Age: 47 y.o. MRN: [id number]  Chief Complaint  Patient presents with   Diarrhea    X10 days, pt was recently in [place]. Pt states having cramping, nausea, and bloating.     HPI Discussed the use of AI scribe software for clinical note transcription with the patient, who gave verbal consent to proceed.  History of Present Illness   The patient, with a history of gluten intolerance, presents with diarrhea that started eleven days ago while traveling in [place]. She stayed in [employer] accommodations and experienced one episode of gluten exposure, which typically results in transient gastrointestinal upset. However, the diarrhea has persisted, and she has been unable to recover despite following a conservative brat diet and alternating between Imodium and Pepto-Bismol. The patient also reports increasing nausea and fatigue, which has led to a loss of appetite. She denies vomiting but has experienced a watery mouth. The patient returned from her trip eight days ago.      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   Primary hypertension 06/04/2022   Preventative health care 05/07/2022   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   Fatigue 08/20/2021   Functional dyspepsia 09/05/2012   GERD (gastroesophageal reflux disease) 08/08/2012   Acne 08/08/2012   Hypoglycemia, unspecified 10/26/2007   Celiac disease 10/26/2007   Hyperthyroidism 04/01/2007   Guillain Barr syndrome 04/01/2007   Past Medical History:  Diagnosis Date   Allergy    Basal cell carcinoma    abdomen   Celiac disease 2009   Functional dyspepsia 09/05/2012   Gastritis    GERD (gastroesophageal reflux disease) 08/08/2012   Guillain Barr syndrome 04/01/2007    After influenza vaccine   Hyperthyroidism 04/01/2007   Hypoglycemia, unspecified 10/26/2007   Past Surgical History:  Procedure Laterality Date   CESAREAN SECTION     COLONOSCOPY  05/12/2007   Leone Payor   ESOPHAGOGASTRODUODENOSCOPY     SKIN CANCER EXCISION     abdomin   WISDOM TOOTH EXTRACTION     Social History   Tobacco Use   Smoking status: Former    Current packs/day: 0.00    Types: Cigarettes    Quit date: 01/19/1998    Years since quitting: 24.6   Smokeless tobacco: Never   Tobacco comments:    About age 89   Vaping Use   Vaping status: Never Used  Substance Use Topics   Alcohol use: Yes    Alcohol/week: 4.0 - 8.0 standard drinks of alcohol    Types: 4 - 8 Glasses of wine per week   Drug use: Yes    Comment: CBD   Social History   Socioeconomic History   Marital status: Married    Spouse name: [name]   Number of children: 2   Years of education: 16   Highest education level: Bachelor's degree (e.g., BA, AB, BS)  Occupational History   Occupation: Social worker: [employer]  Tobacco Use   Smoking status: Former    Current packs/day: 0.00    Types: Cigarettes    Quit date: 01/19/1998    Years since quitting: 24.6   Smokeless tobacco: Never   Tobacco comments:    About age 23   Vaping Use   Vaping status:  Never Used  Substance and Sexual Activity   Alcohol use: Yes    Alcohol/week: 4.0 - 8.0 standard drinks of alcohol    Types: 4 - 8 Glasses of wine per week   Drug use: Yes    Comment: CBD   Sexual activity: Yes    Partners: Male  Other Topics Concern   Not on file  Social History Narrative   Married with 2 children   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 [place]   Occasional alcohol no drugs or tobacco use at this time   Exercise-- 2 x a week    R handed   Caffeine: a C of tea and Coffee a day   Social Determinants of Health   Financial Resource Strain: Low Risk  (06/04/2022)   Overall Financial Resource  Strain (CARDIA)    Difficulty of Paying Living Expenses: Not very hard  Food Insecurity: No Food Insecurity (06/04/2022)   Hunger Vital Sign    Worried About Running Out of Food in the Last Year: Never true    Ran Out of Food in the Last Year: Never true  Transportation Needs: No Transportation Needs (06/04/2022)   PRAPARE - Administrator, Civil Service (Medical): No    Lack of Transportation (Non-Medical): No  Physical Activity: Insufficiently Active (06/04/2022)   Exercise Vital Sign    Days of Exercise per Week: 2 days    Minutes of Exercise per Session: 30 min  Stress: Stress Concern Present (06/04/2022)   [employer] of Occupational Health - Occupational Stress Questionnaire    Feeling of Stress : To some extent  Social Connections: Socially Isolated (06/04/2022)   Social Connection and Isolation Panel [NHANES]    Frequency of Communication with Friends and Family: Never    Frequency of Social Gatherings with Friends and Family: Twice a week    Attends Religious Services: Never    Diplomatic Services operational officer: No    Attends Engineer, structural: Not on file    Marital Status: Married  Catering manager Violence: Not on file   Family Status  Relation Name Status   Mother  Deceased   Father  Alive   Sister  Alive   MGM  Deceased   Cousin  (Not Specified)   Cousin  (Not Specified)   Other  (Not Specified)   Neg Hx  (Not Specified)  No partnership data on file   Family History  Problem Relation Age of Onset   Leukemia Mother 33   Hypertension Mother    Stroke Mother    Memory loss Mother    Hypertension Father    Hyperlipidemia Father    Diabetes Father    Prostate cancer Father 56   Depression Father    Hypertension Sister    Bipolar disorder Sister    Stroke Maternal Grandmother    Multiple sclerosis Cousin    Lupus Cousin    Hypertension Other        siblings   Autoimmune disease Other        numerous family members on her  mother's side   Colon cancer Neg Hx    Esophageal cancer Neg Hx    Stomach cancer Neg Hx    Rectal cancer Neg Hx    Colon polyps Neg Hx    Allergies  Allergen Reactions   Gluten Meal Other (See Comments)   Influenza Vac Split Quad     REACTION: pt had Drema Balzarine--- can  not have flu shot      Review of Systems  Constitutional:  Negative for fever and malaise/fatigue.  HENT:  Negative for congestion.   Eyes:  Negative for blurred vision.  Respiratory:  Negative for cough and shortness of breath.   Cardiovascular:  Negative for chest pain, palpitations and leg swelling.  Gastrointestinal:  Negative for abdominal pain, blood in stool, nausea and vomiting.  Genitourinary:  Negative for dysuria and frequency.  Musculoskeletal:  Negative for back pain and falls.  Skin:  Negative for rash.  Neurological:  Negative for dizziness, loss of consciousness and headaches.  Endo/Heme/Allergies:  Negative for environmental allergies.  Psychiatric/Behavioral:  Negative for depression. The patient is not nervous/anxious.       Objective:     BP (!) 140/80 (BP Location: Right Arm, Patient Position: Sitting, Cuff Size: Normal)   Pulse 80   Temp 98.4 F (36.9 C) (Oral)   Resp 18   Ht 5\' 6"  (1.676 m)   Wt 136 lb (61.7 kg)   SpO2 97%   BMI 21.95 kg/m  BP Readings from Last 3 Encounters:  09/14/22 (!) 140/80  08/18/22 130/82  06/25/22 118/80   Wt Readings from Last 3 Encounters:  09/14/22 136 lb (61.7 kg)  08/18/22 139 lb (63 kg)  06/25/22 139 lb (63 kg)   SpO2 Readings from Last 3 Encounters:  09/14/22 97%  08/18/22 99%  06/25/22 97%      Physical Exam Vitals and nursing note reviewed.  Constitutional:      General: She is not in acute distress.    Appearance: Normal appearance.  HENT:     Head: Normocephalic and atraumatic.     Right Ear: Tympanic membrane, ear canal and external ear normal. There is no impacted cerumen.     Left Ear: Tympanic membrane, ear canal and  external ear normal. There is no impacted cerumen.     Nose: Nose normal. No congestion or rhinorrhea.     Right Sinus: No maxillary sinus tenderness or frontal sinus tenderness.     Left Sinus: No maxillary sinus tenderness or frontal sinus tenderness.     Mouth/Throat:     Mouth: Mucous membranes are moist.     Pharynx: Oropharynx is clear. No oropharyngeal exudate or posterior oropharyngeal erythema.  Eyes:     General: No scleral icterus.       Right eye: No discharge.        Left eye: No discharge.     Conjunctiva/sclera: Conjunctivae normal.  Cardiovascular:     Rate and Rhythm: Normal rate and regular rhythm.     Heart sounds: Normal heart sounds.  Pulmonary:     Effort: Pulmonary effort is normal. No respiratory distress.     Breath sounds: Normal breath sounds.  Abdominal:     General: There is distension.  Musculoskeletal:     Cervical back: Normal range of motion.  Lymphadenopathy:     Cervical: No cervical adenopathy.  Skin:    General: Skin is warm and dry.  Neurological:     General: No focal deficit present.     Mental Status: She is alert and oriented to person, place, and time.  Psychiatric:        Mood and Affect: Mood normal.        Behavior: Behavior normal.        Thought Content: Thought content normal.        Judgment: Judgment normal.      Results for  orders placed or performed in visit on 09/14/22  POC COVID-19  Result Value Ref Range   SARS Coronavirus 2 Ag Negative Negative    Last CBC Lab Results  Component Value Date   WBC 6.0 05/07/2022   HGB 14.3 05/07/2022   HCT 42.2 05/07/2022   MCV 91.7 05/07/2022   RDW 13.3 05/07/2022   PLT 387.0 05/07/2022   Last metabolic panel Lab Results  Component Value Date   GLUCOSE 85 06/25/2022   NA 139 06/25/2022   K 4.6 06/25/2022   CL 103 06/25/2022   CO2 27 06/25/2022   BUN 12 06/25/2022   CREATININE 0.82 06/25/2022   GFR 86.04 06/25/2022   CALCIUM 9.6 06/25/2022   PHOS 3.0 04/25/2008    PROT 6.9 06/25/2022   ALBUMIN 4.1 06/25/2022   BILITOT 0.5 06/25/2022   ALKPHOS 39 06/25/2022   AST 17 06/25/2022   ALT 15 06/25/2022   Last lipids Lab Results  Component Value Date   CHOL 176 05/07/2022   HDL 74.30 05/07/2022   LDLCALC 93 05/07/2022   TRIG 44.0 05/07/2022   CHOLHDL 2 05/07/2022   Last hemoglobin A1c No results found for: "HGBA1C" Last thyroid functions Lab Results  Component Value Date   TSH 1.65 05/07/2022   Last vitamin D Lab Results  Component Value Date   VD25OH 31.40 08/16/2013   Last vitamin B12 and Folate Lab Results  Component Value Date   VITAMINB12 285 03/15/2020   FOLATE > 20.0 ng/mL 04/25/2008      The 10-year ASCVD risk score (Arnett DK, et al., 2019) is: 0.7%    Assessment & Plan:   Problem List Items Addressed This Visit       Unprioritized   Traveler's diarrhea - Primary    Zithromax 500 mg 2 po x1 Stool studies  If zithromax does not help ,  can try bentyl and gi referral       Relevant Medications   azithromycin (ZITHROMAX) 500 MG tablet   Other Relevant Orders   POC COVID-19 (Completed)   CBC with Differential/Platelet   Cdiff NAA+O+P+Stool Culture   Comprehensive metabolic panel  Assessment and Plan    Traveler's Diarrhea: Persistent diarrhea for 11 days following travel to [place]. Associated with nausea, fatigue, and bloating. No fever or vomiting. Failed conservative management with Imodium, Pepto, and BRAT diet. -Order stool studies to rule out other causes. -Start Azithromycin 1g one-time dose. -If diarrhea resolves, no need to submit stool samples.  COVID-19: Given the ongoing pandemic and the presence of GI symptoms, a COVID-19 test is warranted despite the absence of respiratory symptoms. -Perform COVID-19 swab test.  General Health Maintenance -Obtain blood work to assess overall health status.        No follow-ups on file.    [name], DO

## 2022-09-15 ENCOUNTER — Other Ambulatory Visit (INDEPENDENT_AMBULATORY_CARE_PROVIDER_SITE_OTHER): Payer: BC Managed Care – PPO

## 2022-09-15 ENCOUNTER — Other Ambulatory Visit: Payer: Self-pay | Admitting: *Deleted

## 2022-09-15 DIAGNOSIS — M9905 Segmental and somatic dysfunction of pelvic region: Secondary | ICD-10-CM | POA: Diagnosis not present

## 2022-09-15 DIAGNOSIS — M7711 Lateral epicondylitis, right elbow: Secondary | ICD-10-CM | POA: Diagnosis not present

## 2022-09-15 DIAGNOSIS — A09 Infectious gastroenteritis and colitis, unspecified: Secondary | ICD-10-CM

## 2022-09-15 DIAGNOSIS — M9903 Segmental and somatic dysfunction of lumbar region: Secondary | ICD-10-CM | POA: Diagnosis not present

## 2022-09-15 DIAGNOSIS — M9902 Segmental and somatic dysfunction of thoracic region: Secondary | ICD-10-CM | POA: Diagnosis not present

## 2022-09-15 LAB — CBC WITH DIFFERENTIAL/PLATELET
Basophils Absolute: 0 10*3/uL (ref 0.0–0.1)
Basophils Relative: 0.7 % (ref 0.0–3.0)
Eosinophils Absolute: 0.1 10*3/uL (ref 0.0–0.7)
Eosinophils Relative: 1.2 % (ref 0.0–5.0)
HCT: 45.9 % (ref 36.0–46.0)
Hemoglobin: 15.4 g/dL — ABNORMAL HIGH (ref 12.0–15.0)
Lymphocytes Relative: 38 % (ref 12.0–46.0)
Lymphs Abs: 1.8 10*3/uL (ref 0.7–4.0)
MCHC: 33.6 g/dL (ref 30.0–36.0)
MCV: 91.3 fl (ref 78.0–100.0)
Monocytes Absolute: 0.6 10*3/uL (ref 0.1–1.0)
Monocytes Relative: 12.5 % — ABNORMAL HIGH (ref 3.0–12.0)
Neutro Abs: 2.2 10*3/uL (ref 1.4–7.7)
Neutrophils Relative %: 47.6 % (ref 43.0–77.0)
Platelets: 429 10*3/uL — ABNORMAL HIGH (ref 150.0–400.0)
RBC: 5.02 Mil/uL (ref 3.87–5.11)
RDW: 13.2 % (ref 11.5–15.5)
WBC: 4.7 10*3/uL (ref 4.0–10.5)

## 2022-09-15 LAB — COMPREHENSIVE METABOLIC PANEL
ALT: 16 U/L (ref 0–35)
AST: 16 U/L (ref 0–37)
Albumin: 4.3 g/dL (ref 3.5–5.2)
Alkaline Phosphatase: 49 U/L (ref 39–117)
BUN: 7 mg/dL (ref 6–23)
CO2: 20 mEq/L (ref 19–32)
Calcium: 9.2 mg/dL (ref 8.4–10.5)
Chloride: 106 mEq/L (ref 96–112)
Creatinine, Ser: 0.89 mg/dL (ref 0.40–1.20)
GFR: 77.86 mL/min (ref >60.00)
Glucose, Bld: 82 mg/dL (ref 70–99)
Potassium: 4.4 mEq/L (ref 3.5–5.1)
Sodium: 135 mEq/L (ref 135–145)
Total Bilirubin: 0.3 mg/dL (ref 0.2–1.2)
Total Protein: 7.2 g/dL (ref 6.0–8.3)

## 2022-09-29 ENCOUNTER — Other Ambulatory Visit: Payer: Self-pay | Admitting: Family Medicine

## 2022-09-29 ENCOUNTER — Encounter: Payer: Self-pay | Admitting: Family Medicine

## 2022-09-29 DIAGNOSIS — R110 Nausea: Secondary | ICD-10-CM

## 2022-10-01 ENCOUNTER — Ambulatory Visit (INDEPENDENT_AMBULATORY_CARE_PROVIDER_SITE_OTHER): Payer: BC Managed Care – PPO | Admitting: Orthopedic Surgery

## 2022-10-01 ENCOUNTER — Other Ambulatory Visit (INDEPENDENT_AMBULATORY_CARE_PROVIDER_SITE_OTHER): Payer: BC Managed Care – PPO

## 2022-10-01 ENCOUNTER — Encounter: Payer: Self-pay | Admitting: Orthopedic Surgery

## 2022-10-01 VITALS — BP 131/81 | HR 71 | Ht 66.0 in | Wt 136.0 lb

## 2022-10-01 DIAGNOSIS — M545 Low back pain, unspecified: Secondary | ICD-10-CM

## 2022-10-01 NOTE — Progress Notes (Signed)
Orthopedic Spine Surgery Office Note  Assessment: Patient is a 47 y.o. female with chronic low back pain which has improved with PT. 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 No pain radiating into either lower extremity. Suspect more of her pain is from the degenerative disc   Plan: -Explained that initially conservative treatments are tried prior to surgery. Some treatments she can try (some of which she has) include: PT, home exercise program, medrol dosepak, lumbar steroid injections -Patient has tried heating pad, activity modification, chiropractor, home exercise program, PT, Tylenol, Aleve -Since she has noticed improvement with PT, I recommended continuing with PT and home exercises.  I told her that we could also do PT in the future if pain were to return.  Another option would be a lumbar steroid injection if pain gets bad enough -She will call the office if she needs a new prescription for PT or wants to try the lumbar steroid injection -Patient should return to office on an as-needed basis   Patient expressed understanding of the plan and all questions were answered to the patient's satisfaction.   ___________________________________________________________________________   History:  Patient is a 47 y.o. female who presents today for lumbar spine.  Patient first injured her back about 20 years ago but is really noticed worsening of her pain with the last 2 years or so.  Pain is felt in the lower lumbar region.  She initially felt that most activities would make the pain worse.  It got to the point that the pain was starting to affect her blood pressure.  She has had instances in the past where her pain radiates into the left buttock and leg but is not currently experiencing that symptom.  The pain is well localized to the lower lumbar spine at this time.  She has been working with physical therapy and doing home exercises.  She has noted that her pain has improved  with PT but has not completely resolved.   Weakness: Yes, her lower back feels weaker.  No other weakness noted Symptoms of imbalance: Denies Paresthesias and numbness: Denies Bowel or bladder incontinence: Has had issues with bowel incontinence since a trip and developed GI symptoms.  No urinary issues. Saddle anesthesia: Denies  Treatments tried: heating pad, activity modification, chiropractor, home exercise program, PT, Tylenol, Aleve  Review of systems: Denies fevers and chills, night sweats, unexplained weight loss, history of cancer, pain that wakes them at night  Past medical history: HTN GERD DM Barr syndrome Celiac disease  Allergies: Gluten, pollen  Past surgical history:  Cesarean section  Social history: Denies use of nicotine product (smoking, vaping, patches, smokeless) Alcohol use: Yes, approximately 6-7 drinks per week Denies recreational drug use   Physical Exam:  BMI of 22.0   General: no acute distress, appears stated age Neurologic: alert, answering questions appropriately, following commands Respiratory: unlabored breathing on room air, symmetric chest rise Psychiatric: appropriate affect, normal cadence to speech   MSK (spine):  -Strength exam      Left  Right EHL    5/5  5/5 TA    5/5  5/5 GSC    5/5  5/5 Knee extension  5/5  5/5 Hip flexion   5/5  5/5  -Sensory exam    Sensation intact to light touch in L3-S1 nerve distributions of bilateral lower extremities  -Achilles DTR: 2/4 on the left, 2/4 on the right -Patellar tendon DTR: 2/4 on the left, 2/4 on the right  -Straight leg  raise: Negative bilaterally -Clonus: no beats bilaterally  -Left hip exam: No pain through range of motion, negative Stinchfield, negative FABER, negative SI joint compression test -Right hip exam: No pain through range of motion, negative Stinchfield, negative FABER, negative SI joint compression test  Imaging: XR of the lumbar spine from 10/01/2022  was independently reviewed and interpreted, showing disc height loss at L5/S1.  No other significant degenerative changes.  No evidence of instability in flexion/extension views.  No fracture or dislocation seen.  MRI of the lumbar spine from 08/27/2022 was independently reviewed and interpreted, showing degenerative disc disease at L5/S1.  There is a central disc herniation at L5/S1.  No other disc herniation seen.  No significant stenosis seen.   Patient name: CORNESHIA ZELAYA Patient MRN: [id number] Date of visit: 10/01/22

## 2022-10-14 DIAGNOSIS — D235 Other benign neoplasm of skin of trunk: Secondary | ICD-10-CM | POA: Diagnosis not present

## 2022-10-14 DIAGNOSIS — D225 Melanocytic nevi of trunk: Secondary | ICD-10-CM | POA: Diagnosis not present

## 2022-10-14 DIAGNOSIS — L821 Other seborrheic keratosis: Secondary | ICD-10-CM | POA: Diagnosis not present

## 2022-10-14 DIAGNOSIS — L814 Other melanin hyperpigmentation: Secondary | ICD-10-CM | POA: Diagnosis not present

## 2022-10-27 DIAGNOSIS — M9902 Segmental and somatic dysfunction of thoracic region: Secondary | ICD-10-CM | POA: Diagnosis not present

## 2022-10-27 DIAGNOSIS — M9905 Segmental and somatic dysfunction of pelvic region: Secondary | ICD-10-CM | POA: Diagnosis not present

## 2022-10-27 DIAGNOSIS — M9903 Segmental and somatic dysfunction of lumbar region: Secondary | ICD-10-CM | POA: Diagnosis not present

## 2022-10-27 DIAGNOSIS — M7711 Lateral epicondylitis, right elbow: Secondary | ICD-10-CM | POA: Diagnosis not present

## 2022-11-20 DIAGNOSIS — R140 Abdominal distension (gaseous): Secondary | ICD-10-CM | POA: Diagnosis not present

## 2022-11-20 DIAGNOSIS — K900 Celiac disease: Secondary | ICD-10-CM | POA: Diagnosis not present

## 2022-11-20 DIAGNOSIS — R197 Diarrhea, unspecified: Secondary | ICD-10-CM | POA: Diagnosis not present

## 2022-11-20 DIAGNOSIS — R1013 Epigastric pain: Secondary | ICD-10-CM | POA: Diagnosis not present

## 2022-11-23 DIAGNOSIS — R1013 Epigastric pain: Secondary | ICD-10-CM | POA: Diagnosis not present

## 2022-11-23 DIAGNOSIS — K2950 Unspecified chronic gastritis without bleeding: Secondary | ICD-10-CM | POA: Diagnosis not present

## 2022-11-23 DIAGNOSIS — R140 Abdominal distension (gaseous): Secondary | ICD-10-CM | POA: Diagnosis not present

## 2022-11-23 DIAGNOSIS — K3189 Other diseases of stomach and duodenum: Secondary | ICD-10-CM | POA: Diagnosis not present

## 2022-11-23 DIAGNOSIS — K900 Celiac disease: Secondary | ICD-10-CM | POA: Diagnosis not present

## 2022-11-27 DIAGNOSIS — R197 Diarrhea, unspecified: Secondary | ICD-10-CM | POA: Diagnosis not present

## 2022-12-02 DIAGNOSIS — G43B0 Ophthalmoplegic migraine, not intractable: Secondary | ICD-10-CM | POA: Diagnosis not present

## 2022-12-02 DIAGNOSIS — H04123 Dry eye syndrome of bilateral lacrimal glands: Secondary | ICD-10-CM | POA: Diagnosis not present

## 2022-12-02 DIAGNOSIS — H10413 Chronic giant papillary conjunctivitis, bilateral: Secondary | ICD-10-CM | POA: Diagnosis not present

## 2022-12-02 DIAGNOSIS — H53143 Visual discomfort, bilateral: Secondary | ICD-10-CM | POA: Diagnosis not present

## 2022-12-07 DIAGNOSIS — K638219 Small intestinal bacterial overgrowth, unspecified: Secondary | ICD-10-CM | POA: Diagnosis not present

## 2022-12-08 DIAGNOSIS — M9903 Segmental and somatic dysfunction of lumbar region: Secondary | ICD-10-CM | POA: Diagnosis not present

## 2022-12-08 DIAGNOSIS — M9902 Segmental and somatic dysfunction of thoracic region: Secondary | ICD-10-CM | POA: Diagnosis not present

## 2022-12-08 DIAGNOSIS — M9905 Segmental and somatic dysfunction of pelvic region: Secondary | ICD-10-CM | POA: Diagnosis not present

## 2022-12-08 DIAGNOSIS — M7711 Lateral epicondylitis, right elbow: Secondary | ICD-10-CM | POA: Diagnosis not present

## 2022-12-22 ENCOUNTER — Ambulatory Visit: Payer: BC Managed Care – PPO | Admitting: Internal Medicine

## 2022-12-24 ENCOUNTER — Ambulatory Visit: Payer: BC Managed Care – PPO | Admitting: Family Medicine

## 2022-12-24 ENCOUNTER — Encounter: Payer: Self-pay | Admitting: Family Medicine

## 2022-12-24 DIAGNOSIS — I10 Essential (primary) hypertension: Secondary | ICD-10-CM | POA: Diagnosis not present

## 2022-12-24 DIAGNOSIS — A09 Infectious gastroenteritis and colitis, unspecified: Secondary | ICD-10-CM | POA: Diagnosis not present

## 2022-12-24 LAB — COMPREHENSIVE METABOLIC PANEL
ALT: 19 U/L (ref 0–35)
AST: 18 U/L (ref 0–37)
Albumin: 4.3 g/dL (ref 3.5–5.2)
Alkaline Phosphatase: 30 U/L — ABNORMAL LOW (ref 39–117)
BUN: 10 mg/dL (ref 6–23)
CO2: 26 meq/L (ref 19–32)
Calcium: 9.3 mg/dL (ref 8.4–10.5)
Chloride: 104 meq/L (ref 96–112)
Creatinine, Ser: 0.85 mg/dL (ref 0.40–1.20)
GFR: 82.12 mL/min (ref >60.00)
Glucose, Bld: 86 mg/dL (ref 70–99)
Potassium: 4.5 meq/L (ref 3.5–5.1)
Sodium: 137 meq/L (ref 135–145)
Total Bilirubin: 0.5 mg/dL (ref 0.2–1.2)
Total Protein: 6.8 g/dL (ref 6.0–8.3)

## 2022-12-24 LAB — LIPID PANEL
Cholesterol: 145 mg/dL (ref 0–200)
HDL: 58.6 mg/dL (ref >39.00)
LDL Cholesterol: 71 mg/dL (ref 0–99)
NonHDL: 86.3
Total CHOL/HDL Ratio: 2
Triglycerides: 78 mg/dL (ref 0.0–149.0)
VLDL: 15.6 mg/dL (ref 0.0–40.0)

## 2022-12-24 LAB — VITAMIN B12: Vitamin B-12: 352 pg/mL (ref 211–911)

## 2022-12-24 LAB — CBC WITH DIFFERENTIAL/PLATELET
Basophils Absolute: 0 10*3/uL (ref 0.0–0.1)
Basophils Relative: 0.9 % (ref 0.0–3.0)
Eosinophils Absolute: 0 10*3/uL (ref 0.0–0.7)
Eosinophils Relative: 0.2 % (ref 0.0–5.0)
HCT: 41.5 % (ref 36.0–46.0)
Hemoglobin: 14.1 g/dL (ref 12.0–15.0)
Lymphocytes Relative: 29.4 % (ref 12.0–46.0)
Lymphs Abs: 1.2 10*3/uL (ref 0.7–4.0)
MCHC: 34 g/dL (ref 30.0–36.0)
MCV: 91 fL (ref 78.0–100.0)
Monocytes Absolute: 0.3 10*3/uL (ref 0.1–1.0)
Monocytes Relative: 6.2 % (ref 3.0–12.0)
Neutro Abs: 2.7 10*3/uL (ref 1.4–7.7)
Neutrophils Relative %: 63.3 % (ref 43.0–77.0)
Platelets: 341 10*3/uL (ref 150.0–400.0)
RBC: 4.56 Mil/uL (ref 3.87–5.11)
RDW: 13.4 % (ref 11.5–15.5)
WBC: 4.2 10*3/uL (ref 4.0–10.5)

## 2022-12-24 LAB — TSH: TSH: 1.25 u[IU]/mL (ref 0.35–5.50)

## 2022-12-24 LAB — VITAMIN D 25 HYDROXY (VIT D DEFICIENCY, FRACTURES): VITD: 35.99 ng/mL (ref 30.00–100.00)

## 2022-12-24 MED ORDER — LOSARTAN POTASSIUM 50 MG PO TABS: 50.0000 mg | ORAL_TABLET | Freq: Every day | ORAL | 1 refills | Status: DC

## 2022-12-24 NOTE — Progress Notes (Signed)
Established Patient Office Visit  Subjective   Patient ID: [name] [name], female    DOB: [birth year]  Age: 47 y.o. MRN: [id number]  Chief Complaint  Patient presents with   Hypertension   Follow-up    HPI Discussed the use of AI scribe software for clinical note transcription with the patient, who gave verbal consent to proceed.  History of Present Illness   The patient, with a history of celiac disease, presents for a follow-up visit and medication refill. She reports recent diagnosis of small intestinal bacterial overgrowth (SIBO) and has been experiencing severe fatigue, exhaustion, and dehydration. The patient describes the fatigue as both physical and mental, with brain fog and lack of motivation. She also reports increased thirst, especially in the morning and around lunchtime. The patient has been adhering to a strict gluten-free diet at home, but acknowledges the risk of gluten exposure when eating out or on vacation. She has recently completed a course of antibiotics for SIBO and the associated diarrhea has resolved. The patient is considering seeking advice from a functional medicine doctor due to the ongoing symptoms and lack of improvement.      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   Primary hypertension 06/04/2022   Preventative health care 05/07/2022   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   Fatigue 08/20/2021   Functional dyspepsia 09/05/2012   GERD (gastroesophageal reflux disease) 08/08/2012   Acne 08/08/2012   Hypoglycemia, unspecified 10/26/2007   Celiac disease 10/26/2007   Hyperthyroidism 04/01/2007   Guillain Barr syndrome 04/01/2007   Past Medical History:  Diagnosis Date   Allergy    Basal cell carcinoma    abdomen   Celiac disease 2009   Functional dyspepsia 09/05/2012   Gastritis    GERD  (gastroesophageal reflux disease) 08/08/2012   Guillain Barr syndrome 04/01/2007   After influenza vaccine   Hyperthyroidism 04/01/2007   Hypoglycemia, unspecified 10/26/2007   Past Surgical History:  Procedure Laterality Date   CESAREAN SECTION     COLONOSCOPY  05/12/2007   Leone Payor   ESOPHAGOGASTRODUODENOSCOPY     SKIN CANCER EXCISION     abdomin   WISDOM TOOTH EXTRACTION     Social History   Tobacco Use   Smoking status: Former    Current packs/day: 0.00    Types: Cigarettes    Quit date: 01/19/1998    Years since quitting: 24.9   Smokeless tobacco: Never   Tobacco comments:    About age 66   Vaping Use   Vaping status: Never Used  Substance Use Topics   Alcohol use: Yes    Alcohol/week: 4.0 - 8.0 standard drinks of alcohol    Types: 4 - 8 Glasses of wine per week   Drug use: Yes    Comment: CBD   Social History   Socioeconomic History   Marital status: Married    Spouse name: [name]   Number of children: 2   Years of education: 16   Highest education level: Bachelor's degree (e.g., BA, AB, BS)  Occupational History   Occupation: Social worker: [employer]  Tobacco Use   Smoking status: Former    Current packs/day: 0.00    Types: Cigarettes    Quit date: 01/19/1998    Years since quitting: 24.9   Smokeless tobacco: Never   Tobacco comments:  About age 13   Vaping Use   Vaping status: Never Used  Substance and Sexual Activity   Alcohol use: Yes    Alcohol/week: 4.0 - 8.0 standard drinks of alcohol    Types: 4 - 8 Glasses of wine per week   Drug use: Yes    Comment: CBD   Sexual activity: Yes    Partners: Male  Other Topics Concern   Not on file  Social History Narrative   Married with 2 children   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 [place]   Occasional alcohol no drugs or tobacco use at this time   Exercise-- 2 x a week    R handed   Caffeine: a C of tea and Coffee a day   Social Determinants of  Health   Financial Resource Strain: Low Risk  (12/18/2022)   Overall Financial Resource Strain (CARDIA)    Difficulty of Paying Living Expenses: Not very hard  Food Insecurity: No Food Insecurity (12/18/2022)   Hunger Vital Sign    Worried About Running Out of Food in the Last Year: Never true    Ran Out of Food in the Last Year: Never true  Transportation Needs: No Transportation Needs (12/18/2022)   PRAPARE - Administrator, Civil Service (Medical): No    Lack of Transportation (Non-Medical): No  Physical Activity: Insufficiently Active (12/18/2022)   Exercise Vital Sign    Days of Exercise per Week: 4 days    Minutes of Exercise per Session: 30 min  Stress: Stress Concern Present (12/18/2022)   [employer] of Occupational Health - Occupational Stress Questionnaire    Feeling of Stress : Rather much  Social Connections: Socially Isolated (12/18/2022)   Social Connection and Isolation Panel [NHANES]    Frequency of Communication with Friends and Family: Never    Frequency of Social Gatherings with Friends and Family: Twice a week    Attends Religious Services: Never    Database administrator or Organizations: No    Attends Engineer, structural: Not on file    Marital Status: Married  Catering manager Violence: Not on file   Family Status  Relation Name Status   Mother  Deceased   Father  Alive   Sister  Alive   MGM  Deceased   Cousin  (Not Specified)   Cousin  (Not Specified)   Other  (Not Specified)   Neg Hx  (Not Specified)  No partnership data on file   Family History  Problem Relation Age of Onset   Leukemia Mother 16   Hypertension Mother    Stroke Mother    Memory loss Mother    Hypertension Father    Hyperlipidemia Father    Diabetes Father    Prostate cancer Father 48   Depression Father    Hypertension Sister    Bipolar disorder Sister    Stroke Maternal Grandmother    Multiple sclerosis Cousin    Lupus Cousin    Hypertension  Other        siblings   Autoimmune disease Other        numerous family members on her mother's side   Colon cancer Neg Hx    Esophageal cancer Neg Hx    Stomach cancer Neg Hx    Rectal cancer Neg Hx    Colon polyps Neg Hx    Allergies  Allergen Reactions   Gluten Meal Other (See Comments)   Influenza Vac Split Quad  REACTION: pt had Drema Balzarine--- can not have flu shot      ROS    Objective:     BP 120/80 (BP Location: Left Arm, Patient Position: Sitting, Cuff Size: Normal)   Pulse 76   Temp 97.7 F (36.5 C) (Oral)   Resp 18   Ht 5\' 6"  (1.676 m)   Wt 131 lb 12.8 oz (59.8 kg)   SpO2 97%   BMI 21.27 kg/m  BP Readings from Last 3 Encounters:  12/24/22 120/80  10/01/22 131/81  09/14/22 (!) 140/80   Wt Readings from Last 3 Encounters:  12/24/22 131 lb 12.8 oz (59.8 kg)  10/01/22 136 lb (61.7 kg)  09/14/22 136 lb (61.7 kg)   SpO2 Readings from Last 3 Encounters:  12/24/22 97%  09/14/22 97%  08/18/22 99%      Physical Exam Vitals and nursing note reviewed.  Constitutional:      General: She is not in acute distress.    Appearance: Normal appearance. She is well-developed.  HENT:     Head: Normocephalic and atraumatic.     Right Ear: Tympanic membrane, ear canal and external ear normal. There is no impacted cerumen.     Left Ear: Tympanic membrane, ear canal and external ear normal. There is no impacted cerumen.     Nose: Nose normal.     Mouth/Throat:     Mouth: Mucous membranes are moist.     Pharynx: Oropharynx is clear. No oropharyngeal exudate or posterior oropharyngeal erythema.  Eyes:     General: No scleral icterus.       Right eye: No discharge.        Left eye: No discharge.     Conjunctiva/sclera: Conjunctivae normal.     Pupils: Pupils are equal, round, and reactive to light.  Neck:     Thyroid: No thyromegaly or thyroid tenderness.     Vascular: No JVD.  Cardiovascular:     Rate and Rhythm: Normal rate and regular rhythm.      Heart sounds: Normal heart sounds. No murmur heard. Pulmonary:     Effort: Pulmonary effort is normal. No respiratory distress.     Breath sounds: Normal breath sounds.  Abdominal:     General: Bowel sounds are normal. There is no distension.     Palpations: Abdomen is soft. There is no mass.     Tenderness: There is no abdominal tenderness. There is no guarding or rebound.  Genitourinary:    Vagina: Normal.  Musculoskeletal:        General: Normal range of motion.     Cervical back: Normal range of motion and neck supple.     Right lower leg: No edema.     Left lower leg: No edema.  Lymphadenopathy:     Cervical: No cervical adenopathy.  Skin:    General: Skin is warm and dry.     Findings: No erythema or rash.  Neurological:     Mental Status: She is alert and oriented to person, place, and time.     Cranial Nerves: No cranial nerve deficit.     Deep Tendon Reflexes: Reflexes are normal and symmetric.  Psychiatric:        Mood and Affect: Mood normal.        Behavior: Behavior normal.        Thought Content: Thought content normal.        Judgment: Judgment normal.   6  No results found for any visits on 12/24/22.  Last CBC Lab Results  Component Value Date   WBC 4.7 09/15/2022   HGB 15.4 (H) 09/15/2022   HCT 45.9 09/15/2022   MCV 91.3 09/15/2022   RDW 13.2 09/15/2022   PLT 429.0 (H) 09/15/2022   Last metabolic panel Lab Results  Component Value Date   GLUCOSE 82 09/15/2022   NA 135 09/15/2022   K 4.4 09/15/2022   CL 106 09/15/2022   CO2 20 09/15/2022   BUN 7 09/15/2022   CREATININE 0.89 09/15/2022   GFR 77.86 09/15/2022   CALCIUM 9.2 09/15/2022   PHOS 3.0 04/25/2008   PROT 7.2 09/15/2022   ALBUMIN 4.3 09/15/2022   BILITOT 0.3 09/15/2022   ALKPHOS 49 09/15/2022   AST 16 09/15/2022   ALT 16 09/15/2022   Last lipids Lab Results  Component Value Date   CHOL 176 05/07/2022   HDL 74.30 05/07/2022   LDLCALC 93 05/07/2022   TRIG 44.0 05/07/2022    CHOLHDL 2 05/07/2022   Last hemoglobin A1c No results found for: "HGBA1C" Last thyroid functions Lab Results  Component Value Date   TSH 1.65 05/07/2022   Last vitamin D Lab Results  Component Value Date   VD25OH 31.40 08/16/2013   Last vitamin B12 and Folate Lab Results  Component Value Date   VITAMINB12 285 03/15/2020   FOLATE > 20.0 ng/mL 04/25/2008      The 10-year ASCVD risk score (Arnett DK, et al., 2019) is: 0.5%    Assessment & Plan:   Problem List Items Addressed This Visit       Unprioritized   Primary hypertension   Relevant Medications   losartan (COZAAR) 50 MG tablet   Other Relevant Orders   CBC with Differential/Platelet   Comprehensive metabolic panel   Lipid panel   TSH   Traveler's diarrhea   Relevant Medications   azithromycin (ZITHROMAX) 500 MG tablet   Other Relevant Orders   Vitamin B12   VITAMIN D 25 Hydroxy (Vit-D Deficiency, Fractures)  Assessment and Plan    Small Intestinal Bacterial Overgrowth (SIBO) Recent diagnosis with symptoms of diarrhea, fatigue, and dehydration. Diarrhea has resolved but fatigue persists. Patient is concerned about malabsorption and dehydration. -Order comprehensive metabolic panel, complete blood count, and vitamin levels to assess for electrolyte imbalances and potential deficiencies. -Consider referral to a functional medicine specialist as per patient's request.  Celiac Disease Long-standing diagnosis, currently avoiding gluten. Recent SIBO diagnosis may be secondary to celiac disease. -Continue strict gluten-free diet. -Consider referral to a dietitian for further dietary guidance.  Hypertension Well-controlled on Losartan. -Refill Losartan prescription. -Continue current management.  General Health Maintenance -Encourage patient to consider flu vaccination. -Consider Pedialyte or similar for rehydration.        No follow-ups on file.    [name], DO

## 2023-01-19 DIAGNOSIS — M9901 Segmental and somatic dysfunction of cervical region: Secondary | ICD-10-CM | POA: Diagnosis not present

## 2023-01-19 DIAGNOSIS — M9902 Segmental and somatic dysfunction of thoracic region: Secondary | ICD-10-CM | POA: Diagnosis not present

## 2023-01-19 DIAGNOSIS — M9905 Segmental and somatic dysfunction of pelvic region: Secondary | ICD-10-CM | POA: Diagnosis not present

## 2023-01-19 DIAGNOSIS — M9903 Segmental and somatic dysfunction of lumbar region: Secondary | ICD-10-CM | POA: Diagnosis not present

## 2023-03-01 ENCOUNTER — Encounter: Payer: Self-pay | Admitting: Orthopedic Surgery

## 2023-03-02 DIAGNOSIS — M9903 Segmental and somatic dysfunction of lumbar region: Secondary | ICD-10-CM | POA: Diagnosis not present

## 2023-03-02 DIAGNOSIS — M9905 Segmental and somatic dysfunction of pelvic region: Secondary | ICD-10-CM | POA: Diagnosis not present

## 2023-03-02 DIAGNOSIS — M9902 Segmental and somatic dysfunction of thoracic region: Secondary | ICD-10-CM | POA: Diagnosis not present

## 2023-03-17 DIAGNOSIS — K900 Celiac disease: Secondary | ICD-10-CM | POA: Diagnosis not present

## 2023-03-17 DIAGNOSIS — K638219 Small intestinal bacterial overgrowth, unspecified: Secondary | ICD-10-CM | POA: Diagnosis not present

## 2023-03-17 DIAGNOSIS — K219 Gastro-esophageal reflux disease without esophagitis: Secondary | ICD-10-CM | POA: Diagnosis not present

## 2023-03-17 DIAGNOSIS — R197 Diarrhea, unspecified: Secondary | ICD-10-CM | POA: Diagnosis not present

## 2023-03-24 DIAGNOSIS — E538 Deficiency of other specified B group vitamins: Secondary | ICD-10-CM | POA: Diagnosis not present

## 2023-03-24 DIAGNOSIS — G4700 Insomnia, unspecified: Secondary | ICD-10-CM | POA: Diagnosis not present

## 2023-03-24 DIAGNOSIS — K900 Celiac disease: Secondary | ICD-10-CM | POA: Diagnosis not present

## 2023-03-24 DIAGNOSIS — Z131 Encounter for screening for diabetes mellitus: Secondary | ICD-10-CM | POA: Diagnosis not present

## 2023-03-24 DIAGNOSIS — E559 Vitamin D deficiency, unspecified: Secondary | ICD-10-CM | POA: Diagnosis not present

## 2023-03-24 DIAGNOSIS — Z1329 Encounter for screening for other suspected endocrine disorder: Secondary | ICD-10-CM | POA: Diagnosis not present

## 2023-03-24 DIAGNOSIS — K580 Irritable bowel syndrome with diarrhea: Secondary | ICD-10-CM | POA: Diagnosis not present

## 2023-03-24 DIAGNOSIS — Z1321 Encounter for screening for nutritional disorder: Secondary | ICD-10-CM | POA: Diagnosis not present

## 2023-03-24 DIAGNOSIS — E611 Iron deficiency: Secondary | ICD-10-CM | POA: Diagnosis not present

## 2023-03-24 DIAGNOSIS — N951 Menopausal and female climacteric states: Secondary | ICD-10-CM | POA: Diagnosis not present

## 2023-03-29 ENCOUNTER — Other Ambulatory Visit (HOSPITAL_BASED_OUTPATIENT_CLINIC_OR_DEPARTMENT_OTHER): Payer: Self-pay | Admitting: Family Medicine

## 2023-03-29 DIAGNOSIS — Z139 Encounter for screening, unspecified: Secondary | ICD-10-CM

## 2023-04-13 DIAGNOSIS — M9902 Segmental and somatic dysfunction of thoracic region: Secondary | ICD-10-CM | POA: Diagnosis not present

## 2023-04-13 DIAGNOSIS — M9903 Segmental and somatic dysfunction of lumbar region: Secondary | ICD-10-CM | POA: Diagnosis not present

## 2023-04-13 DIAGNOSIS — M9905 Segmental and somatic dysfunction of pelvic region: Secondary | ICD-10-CM | POA: Diagnosis not present

## 2023-04-21 DIAGNOSIS — N951 Menopausal and female climacteric states: Secondary | ICD-10-CM | POA: Diagnosis not present

## 2023-04-21 DIAGNOSIS — K900 Celiac disease: Secondary | ICD-10-CM | POA: Diagnosis not present

## 2023-04-21 DIAGNOSIS — G4700 Insomnia, unspecified: Secondary | ICD-10-CM | POA: Diagnosis not present

## 2023-04-21 DIAGNOSIS — E611 Iron deficiency: Secondary | ICD-10-CM | POA: Diagnosis not present

## 2023-05-09 ENCOUNTER — Encounter: Payer: Self-pay | Admitting: Family Medicine

## 2023-05-13 ENCOUNTER — Encounter: Payer: BC Managed Care – PPO | Admitting: Family Medicine

## 2023-05-18 ENCOUNTER — Ambulatory Visit (HOSPITAL_BASED_OUTPATIENT_CLINIC_OR_DEPARTMENT_OTHER)
Admission: RE | Admit: 2023-05-18 | Discharge: 2023-05-18 | Disposition: A | Discharge status: Home / Self Care | Payer: BC Managed Care – PPO | Source: Ambulatory Visit | Attending: Family Medicine | Admitting: Family Medicine

## 2023-05-18 ENCOUNTER — Ambulatory Visit (INDEPENDENT_AMBULATORY_CARE_PROVIDER_SITE_OTHER): Payer: BC Managed Care – PPO | Admitting: Family Medicine

## 2023-05-18 ENCOUNTER — Encounter (HOSPITAL_BASED_OUTPATIENT_CLINIC_OR_DEPARTMENT_OTHER): Payer: Self-pay

## 2023-05-18 ENCOUNTER — Encounter: Payer: Self-pay | Admitting: Family Medicine

## 2023-05-18 VITALS — BP 108/80 | HR 68 | Temp 98.5°F | Resp 18 | Ht 66.0 in | Wt 124.4 lb

## 2023-05-18 DIAGNOSIS — Z8249 Family history of ischemic heart disease and other diseases of the circulatory system: Secondary | ICD-10-CM

## 2023-05-18 DIAGNOSIS — I10 Essential (primary) hypertension: Secondary | ICD-10-CM

## 2023-05-18 DIAGNOSIS — M5442 Lumbago with sciatica, left side: Secondary | ICD-10-CM

## 2023-05-18 DIAGNOSIS — Z0000 Encounter for general adult medical examination without abnormal findings: Secondary | ICD-10-CM

## 2023-05-18 DIAGNOSIS — Z1231 Encounter for screening mammogram for malignant neoplasm of breast: Secondary | ICD-10-CM | POA: Insufficient documentation

## 2023-05-18 DIAGNOSIS — Z139 Encounter for screening, unspecified: Secondary | ICD-10-CM

## 2023-05-18 DIAGNOSIS — M5441 Lumbago with sciatica, right side: Secondary | ICD-10-CM

## 2023-05-18 DIAGNOSIS — R7989 Other specified abnormal findings of blood chemistry: Secondary | ICD-10-CM

## 2023-05-18 DIAGNOSIS — K900 Celiac disease: Secondary | ICD-10-CM

## 2023-05-18 LAB — CBC WITH DIFFERENTIAL/PLATELET
Basophils Absolute: 0.1 10*3/uL (ref 0.0–0.1)
Basophils Relative: 1.3 % (ref 0.0–3.0)
Eosinophils Absolute: 0 10*3/uL (ref 0.0–0.7)
Eosinophils Relative: 1 % (ref 0.0–5.0)
HCT: 42.3 % (ref 36.0–46.0)
Hemoglobin: 14.4 g/dL (ref 12.0–15.0)
Lymphocytes Relative: 45.8 % (ref 12.0–46.0)
Lymphs Abs: 1.9 10*3/uL (ref 0.7–4.0)
MCHC: 34 g/dL (ref 30.0–36.0)
MCV: 90.7 fl (ref 78.0–100.0)
Monocytes Absolute: 0.3 10*3/uL (ref 0.1–1.0)
Monocytes Relative: 6.1 % (ref 3.0–12.0)
Neutro Abs: 2 10*3/uL (ref 1.4–7.7)
Neutrophils Relative %: 45.8 % (ref 43.0–77.0)
Platelets: 304 10*3/uL (ref 150.0–400.0)
RBC: 4.66 Mil/uL (ref 3.87–5.11)
RDW: 12.8 % (ref 11.5–15.5)
WBC: 4.3 10*3/uL (ref 4.0–10.5)

## 2023-05-18 LAB — COMPREHENSIVE METABOLIC PANEL WITH GFR
ALT: 25 U/L (ref 0–35)
AST: 21 U/L (ref 0–37)
Albumin: 4.9 g/dL (ref 3.5–5.2)
Alkaline Phosphatase: 41 U/L (ref 39–117)
BUN: 10 mg/dL (ref 6–23)
CO2: 29 meq/L (ref 19–32)
Calcium: 9.9 mg/dL (ref 8.4–10.5)
Chloride: 103 meq/L (ref 96–112)
Creatinine, Ser: 0.76 mg/dL (ref 0.40–1.20)
GFR: 93.66 mL/min (ref >60.00)
Glucose, Bld: 85 mg/dL (ref 70–99)
Potassium: 4 meq/L (ref 3.5–5.1)
Sodium: 141 meq/L (ref 135–145)
Total Bilirubin: 0.7 mg/dL (ref 0.2–1.2)
Total Protein: 7.4 g/dL (ref 6.0–8.3)

## 2023-05-18 LAB — LIPID PANEL
Cholesterol: 191 mg/dL (ref 0–200)
HDL: 85.4 mg/dL (ref >39.00)
LDL Cholesterol: 93 mg/dL (ref 0–99)
NonHDL: 105.74
Total CHOL/HDL Ratio: 2
Triglycerides: 63 mg/dL (ref 0.0–149.0)
VLDL: 12.6 mg/dL (ref 0.0–40.0)

## 2023-05-18 MED ORDER — CYCLOBENZAPRINE HCL 10 MG PO TABS
10.0000 mg | ORAL_TABLET | Freq: Three times a day (TID) | ORAL | 0 refills | Status: AC | PRN
Start: 2023-05-18 — End: ?

## 2023-05-18 MED ORDER — LOSARTAN POTASSIUM 50 MG PO TABS: 50.0000 mg | ORAL_TABLET | Freq: Every day | ORAL | 1 refills | Status: DC

## 2023-05-18 NOTE — Assessment & Plan Note (Signed)
 Pt has app with GI in [place] She was unable to get in to see Dr Leone Payor

## 2023-05-18 NOTE — Assessment & Plan Note (Signed)
 Ghm utd Check labs  See AVS  Health Maintenance  Topic Date Due   MAMMOGRAM  05/04/2023   Cervical Cancer Screening (HPV/Pap Cotest)  03/19/2026   DTaP/Tdap/Td (3 - Td or Tdap) 03/15/2030   Colonoscopy  12/03/2031   COVID-19 Vaccine  Completed   Hepatitis C Screening  Completed   HIV Screening  Completed   Pneumococcal Vaccine 78-47 Years old  Aged Out   HPV VACCINES  Aged Out

## 2023-05-18 NOTE — Assessment & Plan Note (Signed)
 Well controlled, no changes to meds. Encouraged heart healthy diet such as the DASH diet and exercise as tolerated.

## 2023-05-18 NOTE — Progress Notes (Signed)
 Established Patient Office Visit  Subjective   Patient ID: [name] [name], female    DOB: [birth year]  Age: 48 y.o. MRN: [id number]  Chief Complaint  Patient presents with   Annual Exam    Pt states fasting     HPI Discussed the use of AI scribe software for clinical note transcription with the patient, who gave verbal consent to proceed.  History of Present Illness [name] [name] "Scottie" is a 48 year old female who presents for an annual physical exam and review of recent test results.  She has undergone a food sensitivity test with inconsistent results, such as reacting to barley but not wheat, despite having celiac disease. She is currently avoiding foods that tested positive at levels one, two, or three, including duck, egg, [place], venison, crab, lettuce, spinach, zucchini, asparagus, lentils, peanuts, soy, milk, banana, coconut, cranberry, pineapple, coffee, yeast, nuts, and hemp. Additionally, she avoids red meat, sugar, and alcohol for general health. Her diet is mainly vegetarian, with two to three ounces of fish or chicken daily. Her gut health has improved with digestive enzymes.  She experiences joint pain and discomfort, particularly in her left foot, hands, and neck, and suspects arthritis in her left foot. She cannot take ibuprofen due to dietary restrictions, and Tylenol is ineffective. She is considering black cumin oil and collagen supplements for pain management.  On February 13th, she had an episode of indigestion, extreme weakness, and dry heaving. Her husband noted she appeared extremely gray. She experienced hot and cold flashes during the episode, which lasted about 30 minutes. She did not take any medication at the time and recovered after resting and hydrating. No chest pain was reported during the episode.  She has a family history of heart issues, with her father likely having died of a heart attack at 77 and her grandmother having had a stroke. She  experiences prolonged healing times for minor injuries and joint inflammation. She is considering physical therapy for her neck and shoulders, which have been tight since sleeping wrong.  She is up to date with mammograms and colonoscopy. She is through menopause and does not plan to return to a gynecologist unless necessary. Pap smears are not due for a couple more years.   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   Primary hypertension 06/04/2022   Preventative health care 05/07/2022   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   Fatigue 08/20/2021   Functional dyspepsia 09/05/2012   GERD (gastroesophageal reflux disease) 08/08/2012   Acne 08/08/2012   Hypoglycemia, unspecified 10/26/2007   Celiac disease 10/26/2007   Hyperthyroidism 04/01/2007   Guillain Barr syndrome 04/01/2007   Past Medical History:  Diagnosis Date   Allergy    Basal cell carcinoma    abdomen   Celiac disease 2009   Functional dyspepsia 09/05/2012   Gastritis    GERD (gastroesophageal reflux disease) 08/08/2012   Guillain Barr syndrome 04/01/2007   After influenza vaccine   Hyperthyroidism 04/01/2007   Hypoglycemia, unspecified 10/26/2007   Past Surgical History:  Procedure Laterality Date   CESAREAN SECTION     COLONOSCOPY  05/12/2007   Leone Payor   ESOPHAGOGASTRODUODENOSCOPY     SKIN CANCER EXCISION     abdomin   WISDOM TOOTH EXTRACTION     Social History   Tobacco Use   Smoking status: Former  Current packs/day: 0.00    Types: Cigarettes    Quit date: 01/19/1998    Years since quitting: 25.3   Smokeless tobacco: Never   Tobacco comments:    About age 60   Vaping Use   Vaping status: Never Used  Substance Use Topics   Alcohol use: Yes    Alcohol/week: 4.0 - 8.0 standard drinks of alcohol    Types: 4 - 8 Glasses of wine per week   Drug  use: Yes    Comment: CBD   Social History   Socioeconomic History   Marital status: Married    Spouse name: [name]   Number of children: 2   Years of education: 16   Highest education level: Bachelor's degree (e.g., BA, AB, BS)  Occupational History   Occupation: Social worker: [employer]  Tobacco Use   Smoking status: Former    Current packs/day: 0.00    Types: Cigarettes    Quit date: 01/19/1998    Years since quitting: 25.3   Smokeless tobacco: Never   Tobacco comments:    About age 37   Vaping Use   Vaping status: Never Used  Substance and Sexual Activity   Alcohol use: Yes    Alcohol/week: 4.0 - 8.0 standard drinks of alcohol    Types: 4 - 8 Glasses of wine per week   Drug use: Yes    Comment: CBD   Sexual activity: Yes    Partners: Male  Other Topics Concern   Not on file  Social History Narrative   Married with 2 children   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 [place]   Occasional alcohol no drugs or tobacco use at this time   Exercise-- 2 x a week    R handed   Caffeine: a C of tea and Coffee a day   Social Drivers of Corporate investment banker Strain: Low Risk  (05/17/2023)   Overall Financial Resource Strain (CARDIA)    Difficulty of Paying Living Expenses: Not hard at all  Food Insecurity: No Food Insecurity (05/17/2023)   Hunger Vital Sign    Worried About Running Out of Food in the Last Year: Never true    Ran Out of Food in the Last Year: Never true  Transportation Needs: No Transportation Needs (05/17/2023)   PRAPARE - Administrator, Civil Service (Medical): No    Lack of Transportation (Non-Medical): No  Physical Activity: Sufficiently Active (05/17/2023)   Exercise Vital Sign    Days of Exercise per Week: 5 days    Minutes of Exercise per Session: 40 min  Stress: Stress Concern Present (05/17/2023)   [employer] of Occupational Health - Occupational Stress Questionnaire    Feeling of  Stress : Very much  Social Connections: Moderately Isolated (05/17/2023)   Social Connection and Isolation Panel [NHANES]    Frequency of Communication with Friends and Family: More than three times a week    Frequency of Social Gatherings with Friends and Family: Once a week    Attends Religious Services: Never    Database administrator or Organizations: No    Attends Engineer, structural: Not on file    Marital Status: Married  Catering manager Violence: Not on file   Family Status  Relation Name Status   Mother  Deceased   Father  Deceased   Sister  Alive   MGM  Deceased   Cousin  (Not Specified)  Cousin  (Not Specified)   Other  (Not Specified)   Neg Hx  (Not Specified)  No partnership data on file   Family History  Problem Relation Age of Onset   Leukemia Mother 33   Hypertension Mother    Stroke Mother    Memory loss Mother    Hypertension Father    Hyperlipidemia Father    Diabetes Father    Prostate cancer Father 30   Depression Father    Heart attack Father    Hypertension Sister    Bipolar disorder Sister    Stroke Maternal Grandmother    Multiple sclerosis Cousin    Lupus Cousin    Hypertension Other        siblings   Autoimmune disease Other        numerous family members on her mother's side   Colon cancer Neg Hx    Esophageal cancer Neg Hx    Stomach cancer Neg Hx    Rectal cancer Neg Hx    Colon polyps Neg Hx    Allergies  Allergen Reactions   Gluten Meal Other (See Comments)   Influenza Vac Split Quad     REACTION: pt had Drema Balzarine--- can not have flu shot      Review of Systems  Constitutional:  Negative for fever and malaise/fatigue.  HENT:  Negative for congestion.   Eyes:  Negative for blurred vision.  Respiratory:  Negative for cough and shortness of breath.   Cardiovascular:  Negative for chest pain, palpitations and leg swelling.  Gastrointestinal:  Positive for heartburn. Negative for abdominal pain, diarrhea,  nausea and vomiting.  Musculoskeletal:  Negative for back pain.  Skin:  Negative for rash.  Neurological:  Negative for loss of consciousness and headaches.      Objective:     BP 108/80 (BP Location: Left Arm, Patient Position: Sitting, Cuff Size: Normal)   Pulse 68   Temp 98.5 F (36.9 C) (Oral)   Resp 18   Ht 5\' 6"  (1.676 m)   Wt 124 lb 6.4 oz (56.4 kg)   LMP 02/24/2019   SpO2 99%   BMI 20.08 kg/m  BP Readings from Last 3 Encounters:  05/18/23 108/80  12/24/22 120/80  10/01/22 131/81   Wt Readings from Last 3 Encounters:  05/18/23 124 lb 6.4 oz (56.4 kg)  12/24/22 131 lb 12.8 oz (59.8 kg)  10/01/22 136 lb (61.7 kg)   SpO2 Readings from Last 3 Encounters:  05/18/23 99%  12/24/22 97%  09/14/22 97%      Physical Exam Vitals and nursing note reviewed.  Constitutional:      General: She is not in acute distress.    Appearance: Normal appearance. She is well-developed.  HENT:     Head: Normocephalic and atraumatic.     Right Ear: Tympanic membrane, ear canal and external ear normal. There is no impacted cerumen.     Left Ear: Tympanic membrane, ear canal and external ear normal. There is no impacted cerumen.     Nose: Nose normal.     Mouth/Throat:     Mouth: Mucous membranes are moist.     Pharynx: Oropharynx is clear. No oropharyngeal exudate or posterior oropharyngeal erythema.  Eyes:     General: No scleral icterus.       Right eye: No discharge.        Left eye: No discharge.     Conjunctiva/sclera: Conjunctivae normal.     Pupils: Pupils are equal, round, and  reactive to light.  Neck:     Thyroid: No thyromegaly or thyroid tenderness.     Vascular: No JVD.  Cardiovascular:     Rate and Rhythm: Normal rate and regular rhythm.     Heart sounds: Normal heart sounds. No murmur heard. Pulmonary:     Effort: Pulmonary effort is normal. No respiratory distress.     Breath sounds: Normal breath sounds.  Abdominal:     General: Bowel sounds are normal.  There is no distension.     Palpations: Abdomen is soft. There is no mass.     Tenderness: There is no abdominal tenderness. There is no guarding or rebound.  Genitourinary:    Vagina: Normal.  Musculoskeletal:        General: Normal range of motion.     Cervical back: Normal range of motion and neck supple.     Right lower leg: No edema.     Left lower leg: No edema.  Lymphadenopathy:     Cervical: No cervical adenopathy.  Skin:    General: Skin is warm and dry.     Findings: No erythema or rash.  Neurological:     Mental Status: She is alert and oriented to person, place, and time.     Cranial Nerves: No cranial nerve deficit.     Deep Tendon Reflexes: Reflexes are normal and symmetric.  Psychiatric:        Mood and Affect: Mood normal.        Behavior: Behavior normal.        Thought Content: Thought content normal.        Judgment: Judgment normal.      No results found for any visits on 05/18/23.  Last CBC Lab Results  Component Value Date   WBC 4.2 12/24/2022   HGB 14.1 12/24/2022   HCT 41.5 12/24/2022   MCV 91.0 12/24/2022   RDW 13.4 12/24/2022   PLT 341.0 12/24/2022   Last metabolic panel Lab Results  Component Value Date   GLUCOSE 86 12/24/2022   NA 137 12/24/2022   K 4.5 12/24/2022   CL 104 12/24/2022   CO2 26 12/24/2022   BUN 10 12/24/2022   CREATININE 0.85 12/24/2022   GFR 82.12 12/24/2022   CALCIUM 9.3 12/24/2022   PHOS 3.0 04/25/2008   PROT 6.8 12/24/2022   ALBUMIN 4.3 12/24/2022   BILITOT 0.5 12/24/2022   ALKPHOS 30 (L) 12/24/2022   AST 18 12/24/2022   ALT 19 12/24/2022   Last lipids Lab Results  Component Value Date   CHOL 145 12/24/2022   HDL 58.60 12/24/2022   LDLCALC 71 12/24/2022   TRIG 78.0 12/24/2022   CHOLHDL 2 12/24/2022   Last hemoglobin A1c No results found for: "HGBA1C" Last thyroid functions Lab Results  Component Value Date   TSH 1.25 12/24/2022   Last vitamin D Lab Results  Component Value Date   VD25OH 35.99  12/24/2022   Last vitamin B12 and Folate Lab Results  Component Value Date   VITAMINB12 352 12/24/2022   FOLATE > 20.0 ng/mL 04/25/2008    EKG-- NSR--  no old one to compare   The 10-year ASCVD risk score (Arnett DK, et al., 2019) is: 0.5%    Assessment & Plan:   Problem List Items Addressed This Visit       Unprioritized   Primary hypertension   Well controlled, no changes to meds. Encouraged heart healthy diet such as the DASH diet and exercise as tolerated.  Relevant Medications   losartan (COZAAR) 50 MG tablet   Other Relevant Orders   EKG 12-Lead (Completed)   Preventative health care - Primary   Ghm utd Check labs  See AVS  Health Maintenance  Topic Date Due   MAMMOGRAM  05/04/2023   Cervical Cancer Screening (HPV/Pap Cotest)  03/19/2026   DTaP/Tdap/Td (3 - Td or Tdap) 03/15/2030   Colonoscopy  12/03/2031   COVID-19 Vaccine  Completed   Hepatitis C Screening  Completed   HIV Screening  Completed   Pneumococcal Vaccine 12-76 Years old  Aged Out   HPV VACCINES  Aged Out         Relevant Orders   CBC with Differential/Platelet   Comprehensive metabolic panel with GFR   Lipid panel   EKG 12-Lead (Completed)   Celiac disease   Pt has app with GI in [place] She was unable to get in to see Dr Leone Payor      Other Visit Diagnoses       Acute midline low back pain with bilateral sciatica       Relevant Medications   cyclobenzaprine (FLEXERIL) 10 MG tablet     Abnormal thyroid blood test       Relevant Orders   Thyroid Panel With TSH   Thyroid peroxidase antibody     Family history of early CAD       Relevant Orders   Ambulatory referral to Cardiology   EKG 12-Lead     Assessment and Plan Assessment & Plan Atypical Chest Pain   She experienced severe indigestion, weakness, and dry heaving without chest pain, which resolved without intervention. Due to a family history of heart disease, there is concern for cardiac involvement. Perform an EKG to  assess cardiac status and consider a cardiology referral for further evaluation. Advise seeking urgent care if symptoms recur.  Hashimoto's Thyroiditis (suspected)   She was informed of possible Hashimoto's thyroiditis by another provider, but no thyroid studies are available to confirm. Black cumin oil was suggested, but its efficacy is uncertain without a confirmed diagnosis. Order thyroid function tests to confirm the diagnosis and discuss results and treatment options once confirmed.  Arthritis (suspected)   She reports joint pain in her left foot, hands, and neck. Unable to take NSAIDs due to dietary restrictions, and Tylenol is ineffective. Considering collagen supplements, topical lidocaine, and omega-3 supplementation. Regular exercise is encouraged for joint mobility. Consider a trial of collagen supplements and topical lidocaine for localized pain. Encourage omega-3 supplementation and regular exercise.  Food Sensitivities   She was advised to eliminate foods testing positive for sensitivities, including barley, peanuts, and banana. She is following an elimination diet to identify true allergies and improve gut health, reporting improvement with digestive enzymes. She is avoiding red meat, sugar, and alcohol for general health benefits. Continue the elimination diet for 3 months, then reintroduce foods gradually to identify true allergies. Consider a dietitian consultation for managing sensitivities.  Leaky Gut Syndrome (suspected)   She was informed of possible leaky gut syndrome by another provider, but no definitive diagnostic test was provided. Reports improvement in gut health with dietary changes and supplements, though the diagnosis remains uncertain without confirmatory tests. Continue dietary changes and supplements, and monitor symptoms, reassessing if necessary.  General Health Maintenance   She is up to date with her mammogram and colonoscopy. Being post-menopausal, she does not  require annual gynecological visits unless issues arise. She is considering reducing specialist visits and consolidating care. Perform a  Pap smear when due in 3-5 years and consider a bone density scan post-menopause.  Follow-up   She is managing multiple health concerns with dietary and lifestyle changes. Follow-up is necessary to monitor progress and adjust treatment plans. The importance of coordinating care with specialists was discussed. Schedule a follow-up to review test results and reassess treatment plans. Coordinate care with a gastroenterologist and other specialists as needed.    No follow-ups on file.    [name], DO

## 2023-05-19 LAB — THYROID PANEL WITH TSH
Free Thyroxine Index: 2.2 (ref 1.4–3.8)
T3 Uptake: 34 % (ref 22–35)
T4, Total: 6.4 ug/dL (ref 5.1–11.9)
TSH: 1.7 m[IU]/L

## 2023-05-19 LAB — THYROID PEROXIDASE ANTIBODY: Thyroperoxidase Ab SerPl-aCnc: 401 [IU]/mL — ABNORMAL HIGH (ref <9)

## 2023-05-25 DIAGNOSIS — M9903 Segmental and somatic dysfunction of lumbar region: Secondary | ICD-10-CM | POA: Diagnosis not present

## 2023-05-25 DIAGNOSIS — M9905 Segmental and somatic dysfunction of pelvic region: Secondary | ICD-10-CM | POA: Diagnosis not present

## 2023-05-25 DIAGNOSIS — M9902 Segmental and somatic dysfunction of thoracic region: Secondary | ICD-10-CM | POA: Diagnosis not present

## 2023-05-30 ENCOUNTER — Encounter: Payer: Self-pay | Admitting: Family Medicine

## 2023-06-09 ENCOUNTER — Other Ambulatory Visit: Payer: Self-pay

## 2023-06-09 DIAGNOSIS — R7989 Other specified abnormal findings of blood chemistry: Secondary | ICD-10-CM

## 2023-07-07 DIAGNOSIS — E063 Autoimmune thyroiditis: Secondary | ICD-10-CM

## 2023-07-07 HISTORY — DX: Autoimmune thyroiditis: E06.3

## 2023-07-09 ENCOUNTER — Ambulatory Visit: Attending: Cardiology | Admitting: Cardiology

## 2023-07-09 ENCOUNTER — Encounter: Payer: Self-pay | Admitting: Cardiology

## 2023-07-09 ENCOUNTER — Ambulatory Visit: Attending: Cardiology

## 2023-07-09 VITALS — BP 134/76 | HR 56 | Ht 66.0 in | Wt 125.0 lb

## 2023-07-09 DIAGNOSIS — K219 Gastro-esophageal reflux disease without esophagitis: Secondary | ICD-10-CM | POA: Diagnosis not present

## 2023-07-09 DIAGNOSIS — R55 Syncope and collapse: Secondary | ICD-10-CM

## 2023-07-09 DIAGNOSIS — R0609 Other forms of dyspnea: Secondary | ICD-10-CM

## 2023-07-09 DIAGNOSIS — I10 Essential (primary) hypertension: Secondary | ICD-10-CM

## 2023-07-09 HISTORY — DX: Syncope and collapse: R55

## 2023-07-09 NOTE — Progress Notes (Unsigned)
 Cardiology Consultation:    Date:  07/09/2023   ID:  [name] [name], DOB 06-15-1975, MRN [id number]  PCP:  Estill Hemming, DO  Cardiologist:  Ralene Burger, MD   Referring MD: Crecencio Dodge, Candida Chalk, *   Chief Complaint  Patient presents with   Loss of Consciousness    History of Present Illness:    [name] [name] is a 48 y.o. female who is being seen today for the evaluation of dizziness and passing out at the request of Crecencio Dodge, Candida Chalk, *.  Past medical history significant for celiac disease, questionable Hashimoto's, she was referred to us  because of episode of near syncope and syncope.  She said all her life she got this problem that happened typically when she is standing she will start feeling swimmy headed if she does not sit down or lay down she will pass out she describes 1 episode couple weeks ago when she was taking shower started feeling that way developed also some pain in the epigastrium.  Eventually ended up almost completely passing out in the plane done in [place] and after that recovered.  She did not have a bowel movement she did not wet herself.  She tells me that during her life she got close to 30 episodes like that.  She was to smoke long time ago when she was teenager does not anymore.  She is trying to be more active but admits that she does not exercise on the regular basis because of multiple other issues.  She is seeing functional medicine doctor.  She was told to have Hashimoto, her cholesterol looks quite nice with high HDL.  She does not have family history of premature coronary artery disease.  No exertional chest pain tightness squeezing pressure burning chest.  Past Medical History:  Diagnosis Date   Allergy    Basal cell carcinoma    abdomen   Celiac disease 2009   Functional dyspepsia 09/05/2012   Gastritis    GERD (gastroesophageal reflux disease) 08/08/2012   Guillain Barr syndrome 04/01/2007   After influenza vaccine    Hyperthyroidism 04/01/2007   Hypoglycemia, unspecified 10/26/2007    Past Surgical History:  Procedure Laterality Date   CESAREAN SECTION     COLONOSCOPY  05/12/2007   [facility]   ESOPHAGOGASTRODUODENOSCOPY     SKIN CANCER EXCISION     abdomin   WISDOM TOOTH EXTRACTION      Current Medications: Current Meds  Medication Sig   cyclobenzaprine  (FLEXERIL ) 10 MG tablet Take 1 tablet (10 mg total) by mouth 3 (three) times daily as needed for muscle spasms.   Ferrous Sulfate (IRON) 325 (65 FE) MG TABS Take 65 mg by mouth daily.   glucosamine-chondroitin 500-400 MG tablet Take 1 tablet by mouth 3 (three) times daily.   losartan  (COZAAR ) 50 MG tablet Take 1 tablet (50 mg total) by mouth daily.   NON FORMULARY Take 1 tablet by mouth at bedtime. CBD     Allergies:   Banana, Gluten meal, Influenza vac split quad, Peanut (diagnostic), and Tilactase   Social History   Socioeconomic History   Marital status: Married    Spouse name: Porfirio Bristol   Number of children: 2   Years of education: 16   Highest education level: Bachelor's degree (e.g., BA, AB, BS)  Occupational History   Occupation: Social worker: [employer]  Tobacco Use   Smoking status: Former    Current packs/day: 0.00  Types: Cigarettes    Quit date: 01/19/1998    Years since quitting: 25.4   Smokeless tobacco: Never   Tobacco comments:    About age 70   Vaping Use   Vaping status: Never Used  Substance and Sexual Activity   Alcohol use: Yes    Alcohol/week: 4.0 - 8.0 standard drinks of alcohol    Types: 4 - 8 Glasses of wine per week   Drug use: Yes    Comment: CBD   Sexual activity: Yes    Partners: Male  Other Topics Concern   Not on file  Social History Narrative   Married with 2 children   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 [place]   Occasional alcohol no drugs or tobacco use at this time   Exercise-- 2 x a week    R handed   Caffeine: a C of tea and Coffee a day    Social Drivers of Corporate investment banker Strain: Low Risk  (05/17/2023)   Overall Financial Resource Strain (CARDIA)    Difficulty of Paying Living Expenses: Not hard at all  Food Insecurity: No Food Insecurity (05/17/2023)   Hunger Vital Sign    Worried About Running Out of Food in the Last Year: Never true    Ran Out of Food in the Last Year: Never true  Transportation Needs: No Transportation Needs (05/17/2023)   PRAPARE - Administrator, Civil Service (Medical): No    Lack of Transportation (Non-Medical): No  Physical Activity: Sufficiently Active (05/17/2023)   Exercise Vital Sign    Days of Exercise per Week: 5 days    Minutes of Exercise per Session: 40 min  Stress: Stress Concern Present (05/17/2023)   [employer] of Occupational Health - Occupational Stress Questionnaire    Feeling of Stress : Very much  Social Connections: Moderately Isolated (05/17/2023)   Social Connection and Isolation Panel [NHANES]    Frequency of Communication with Friends and Family: More than three times a week    Frequency of Social Gatherings with Friends and Family: Once a week    Attends Religious Services: Never    Database administrator or Organizations: No    Attends Engineer, structural: Not on file    Marital Status: Married     Family History: The patient's family history includes Autoimmune disease in an other family member; Bipolar disorder in her sister; Depression in her father; Diabetes in her father; Heart attack in her father; Hyperlipidemia in her father; Hypertension in her father, mother, sister, and another family member; Leukemia (age of onset: 2) in her mother; Lupus in her cousin; Memory loss in her mother; Multiple sclerosis in her cousin; Prostate cancer (age of onset: 73) in her father; Stroke in her maternal grandmother and mother. There is no history of Colon cancer, Esophageal cancer, Stomach cancer, Rectal cancer, or Colon polyps. ROS:    Please see the history of present illness.    All 14 point review of systems negative except as described per history of present illness.  EKGs/Labs/Other Studies Reviewed:    The following studies were reviewed today:   EKG:  EKG Interpretation Date/Time:  Friday Jul 09 2023 15:53:06 EDT Ventricular Rate:  59 PR Interval:  140 QRS Duration:  74 QT Interval:  402 QTC Calculation: 397 R Axis:   45  Text Interpretation: Sinus bradycardia When compared with ECG of 05-Apr-2006 19:21, No significant change was found Confirmed by Gordan Latina,  Porfirio Bristol [phone]) on 07/09/2023 3:59:55 PM    Recent Labs: 05/18/2023: ALT 25; BUN 10; Creatinine, Ser 0.76; Hemoglobin 14.4; Platelets 304.0; Potassium 4.0; Sodium 141; TSH 1.70  Recent Lipid Panel    Component Value Date/Time   CHOL 191 05/18/2023 1039   TRIG 63.0 05/18/2023 1039   HDL 85.40 05/18/2023 1039   CHOLHDL 2 05/18/2023 1039   VLDL 12.6 05/18/2023 1039   LDLCALC 93 05/18/2023 1039    Physical Exam:    VS:  BP 134/76 (BP Location: Left Arm, Patient Position: Sitting)   Pulse (!) 56   Ht 5\' 6"  (1.676 m)   Wt 125 lb (56.7 kg)   LMP 02/24/2019   SpO2 95%   BMI 20.18 kg/m     Wt Readings from Last 3 Encounters:  07/09/23 125 lb (56.7 kg)  05/18/23 124 lb 6.4 oz (56.4 kg)  12/24/22 131 lb 12.8 oz (59.8 kg)     GEN:  Well nourished, well developed in no acute distress HEENT: Normal NECK: No JVD; No carotid bruits LYMPHATICS: No lymphadenopathy CARDIAC: RRR, no murmurs, no rubs, no gallops RESPIRATORY:  Clear to auscultation without rales, wheezing or rhonchi  ABDOMEN: Soft, non-tender, non-distended MUSCULOSKELETAL:  No edema; No deformity  SKIN: Warm and dry NEUROLOGIC:  Alert and oriented x 3 PSYCHIATRIC:  Normal affect   ASSESSMENT:    1. Primary hypertension   2. Dyspnea on exertion   3. Syncope and collapse   4. Vasovagal syncope   5. Gastroesophageal reflux disease without esophagitis    PLAN:    In order of  problems listed above:  Essential hypertension blood pressure seems to be well-controlled continue present management. Episode of syncope look like vasovagal however I will ask her to wear Zio patch to make sure that she does not have any other abnormalities that are not obvious right now based on her story and physical exam, part evaluation echocardiogram will be done.  As a part of evaluation echocardiogram to be done to make sure her heart is structurally normal Gastroesophageal reflux disease nothing follow-up by GI. Dyslipidemia.  Cholesterol looks fine we will continue present management.  She use anti-inflammatory diet   Medication Adjustments/Labs and Tests Ordered: Current medicines are reviewed at length with the patient today.  Concerns regarding medicines are outlined above.  Orders Placed This Encounter  Procedures   LONG TERM MONITOR (3-14 DAYS)   EKG 12-Lead   ECHOCARDIOGRAM COMPLETE   No orders of the defined types were placed in this encounter.   Signed, Manfred Seed, MD, [facility]. 07/09/2023 4:57 PM    [facility]

## 2023-07-09 NOTE — Patient Instructions (Addendum)
 Medication Instructions:  Your physician recommends that you continue on your current medications as directed. Please refer to the Current Medication list given to you today.  *If you need a refill on your cardiac medications before your next appointment, please call your pharmacy*   Lab Work: None Ordered If you have labs (blood work) drawn today and your tests are completely normal, you will receive your results only by: MyChart Message (if you have MyChart) OR A paper copy in the mail If you have any lab test that is abnormal or we need to change your treatment, we will call you to review the results.   Testing/Procedures:  WHY IS MY DOCTOR PRESCRIBING ZIO? The Zio system is proven and trusted by physicians to detect and diagnose irregular heart rhythms -- and has been prescribed to hundreds of thousands of patients.  The FDA has cleared the Zio system to monitor for many different kinds of irregular heart rhythms. In a study, physicians were able to reach a diagnosis 90% of the time with the Zio system1.  You can wear the Zio monitor -- a small, discreet, comfortable patch -- during your normal day-to-day activity, including while you sleep, shower, and exercise, while it records every single heartbeat for analysis.  1Barrett, P., et al. Comparison of 24 Hour Holter Monitoring Versus 14 Day Novel Adhesive Patch Electrocardiographic Monitoring. American Journal of Medicine, 2014.  ZIO VS. HOLTER MONITORING The Zio monitor can be comfortably worn for up to 14 days. Holter monitors can be worn for 24 to 48 hours, limiting the time to record any irregular heart rhythms you may have. Zio is able to capture data for the 51% of patients who have their first symptom-triggered arrhythmia after 48 hours.1  LIVE WITHOUT RESTRICTIONS The Zio ambulatory cardiac monitor is a small, unobtrusive, and water-resistant patch--you might even forget you're wearing it. The Zio monitor records and stores  every beat of your heart, whether you're sleeping, working out, or showering.    Your physician has requested that you have an echocardiogram. Echocardiography is a painless test that uses sound waves to create images of your heart. It provides your doctor with information about the size and shape of your heart and how well your heart's chambers and valves are working. This procedure takes approximately one hour. There are no restrictions for this procedure. Please do NOT wear cologne, perfume, aftershave, or lotions (deodorant is allowed). Please arrive 15 minutes prior to your appointment time.  Please note: We ask at that you not bring children with you during ultrasound (echo/ vascular) testing. Due to room size and safety concerns, children are not allowed in the ultrasound rooms during exams. Our front office staff cannot provide observation of children in our lobby area while testing is being conducted. An adult accompanying a patient to their appointment will only be allowed in the ultrasound room at the discretion of the ultrasound technician under special circumstances. We apologize for any inconvenience.    Follow-Up: At [facility], you and your health needs are our priority.  As part of our continuing mission to provide you with exceptional heart care, we have created designated Provider Care Teams.  These Care Teams include your primary Cardiologist (physician) and Advanced Practice Providers (APPs -  Physician Assistants and Nurse Practitioners) who all work together to provide you with the care you need, when you need it.  We recommend signing up for the patient portal called "MyChart".  Sign up information is provided on this  After Visit Summary.  MyChart is used to connect with patients for Virtual Visits (Telemedicine).  Patients are able to view lab/test results, encounter notes, upcoming appointments, etc.  Non-urgent messages can be sent to your provider as well.   To learn more  about what you can do with MyChart, go to [email or web address].    Your next appointment:   2 month(s)  The format for your next appointment:   In Person  Provider:   Gypsy Balsam, MD    Other Instructions NA

## 2023-07-14 DIAGNOSIS — M9903 Segmental and somatic dysfunction of lumbar region: Secondary | ICD-10-CM | POA: Diagnosis not present

## 2023-07-14 DIAGNOSIS — M9902 Segmental and somatic dysfunction of thoracic region: Secondary | ICD-10-CM | POA: Diagnosis not present

## 2023-07-14 DIAGNOSIS — M9905 Segmental and somatic dysfunction of pelvic region: Secondary | ICD-10-CM | POA: Diagnosis not present

## 2023-07-20 DIAGNOSIS — N951 Menopausal and female climacteric states: Secondary | ICD-10-CM | POA: Diagnosis not present

## 2023-07-20 DIAGNOSIS — Z1321 Encounter for screening for nutritional disorder: Secondary | ICD-10-CM | POA: Diagnosis not present

## 2023-07-20 DIAGNOSIS — E538 Deficiency of other specified B group vitamins: Secondary | ICD-10-CM | POA: Diagnosis not present

## 2023-07-20 DIAGNOSIS — G4700 Insomnia, unspecified: Secondary | ICD-10-CM | POA: Diagnosis not present

## 2023-07-20 DIAGNOSIS — E611 Iron deficiency: Secondary | ICD-10-CM | POA: Diagnosis not present

## 2023-07-20 DIAGNOSIS — E559 Vitamin D deficiency, unspecified: Secondary | ICD-10-CM | POA: Diagnosis not present

## 2023-07-20 DIAGNOSIS — K900 Celiac disease: Secondary | ICD-10-CM | POA: Diagnosis not present

## 2023-08-16 ENCOUNTER — Ambulatory Visit: Payer: Self-pay | Admitting: Cardiology

## 2023-08-16 DIAGNOSIS — R55 Syncope and collapse: Secondary | ICD-10-CM | POA: Diagnosis not present

## 2023-08-17 ENCOUNTER — Ambulatory Visit (HOSPITAL_BASED_OUTPATIENT_CLINIC_OR_DEPARTMENT_OTHER)
Admission: RE | Admit: 2023-08-17 | Discharge: 2023-08-17 | Disposition: A | Discharge status: Home / Self Care | Source: Ambulatory Visit | Attending: Cardiology | Admitting: Cardiology

## 2023-08-17 DIAGNOSIS — R0609 Other forms of dyspnea: Secondary | ICD-10-CM | POA: Insufficient documentation

## 2023-08-18 ENCOUNTER — Encounter: Payer: Self-pay | Admitting: Cardiology

## 2023-08-18 LAB — ECHOCARDIOGRAM COMPLETE
AR max vel: 2.74 cm2
AV Area VTI: 2.83 cm2
AV Area mean vel: 2.56 cm2
AV Mean grad: 5 mmHg
AV Peak grad: 8.1 mmHg
Ao pk vel: 1.42 m/s
Area-P 1/2: 2.66 cm2
Calc EF: 60.2 %
S' Lateral: 2.4 cm
Single Plane A2C EF: 61 %
Single Plane A4C EF: 60.2 %

## 2023-08-19 ENCOUNTER — Telehealth: Payer: Self-pay | Admitting: Cardiology

## 2023-08-19 NOTE — Telephone Encounter (Signed)
 Patient scheduled.

## 2023-08-19 NOTE — Telephone Encounter (Signed)
 LVM on home and cell for pt to call and get scheduled with Dr. Bernie in [place] next week/kbl 08/19/23

## 2023-08-23 ENCOUNTER — Telehealth: Payer: Self-pay

## 2023-08-23 NOTE — Telephone Encounter (Signed)
 Left message on My Chart with normal results per Dr. Vanetta Shawl note. Routed to PCP.

## 2023-08-24 ENCOUNTER — Ambulatory Visit: Attending: Cardiology | Admitting: Cardiology

## 2023-08-24 VITALS — BP 116/82 | HR 58 | Ht 66.0 in | Wt 124.0 lb

## 2023-08-24 DIAGNOSIS — I4729 Other ventricular tachycardia: Secondary | ICD-10-CM

## 2023-08-24 DIAGNOSIS — R55 Syncope and collapse: Secondary | ICD-10-CM

## 2023-08-24 DIAGNOSIS — I10 Essential (primary) hypertension: Secondary | ICD-10-CM

## 2023-08-24 HISTORY — DX: Other ventricular tachycardia: I47.29

## 2023-08-24 NOTE — Progress Notes (Signed)
 Cardiology Office Note:    Date:  08/24/2023   ID:  Rollene [name] [name], DOB 1975-11-09, MRN [id number]  PCP:  Antonio Cyndee [name], DO  Cardiologist:  [name] Fitch, MD    Referring MD: Antonio Cyndee, [name], *   Chief Complaint  Patient presents with   Follow-up    History of Present Illness:    [name] [name] is a 48 y.o. female with past medical history significant for multiple episode of syncope she had total 30 episodes.  Look like those episodes are vasovagal related.  She requested to be seen today last week on Thursday she passed out.  She was at the evening time outside watching some game.  She started feeling woozy swimmy headed not well she decided to go and get some drink she got up went to the stand she order something while she was waiting she started feeling tunnel vision ringing in the ear and up completely passing out.  When she regained her consciousness she was a little confused initially but then she was able to tell the name of her husband and they were able to get him.  When he came to see her she was sitting in the chair very pale her heart rate was 40 at the time.  Apparently there was some nurse practitioner working the emergency over that they were able to assist.  911 was summoned however when she start getting better the consult she felt poorly for about a day or 2 after that.  Otherwise she seems to be doing fine.  Workup so far included echocardiogram showed structurally normal heart which was anticipated, she did wear a monitor suppressing monitor shows some episode of nonsustained ventricular tachycardia.  Asymptomatic happening during the night  Past Medical History:  Diagnosis Date   Allergy    Basal cell carcinoma    abdomen   Celiac disease 2009   Functional dyspepsia 09/05/2012   Gastritis    GERD (gastroesophageal reflux disease) 08/08/2012   Guillain Barr syndrome 04/01/2007   After influenza vaccine   Hyperthyroidism 04/01/2007    Hypoglycemia, unspecified 10/26/2007    Past Surgical History:  Procedure Laterality Date   CESAREAN SECTION     COLONOSCOPY  05/12/2007   [facility]   ESOPHAGOGASTRODUODENOSCOPY     SKIN CANCER EXCISION     abdomin   WISDOM TOOTH EXTRACTION      Current Medications: Current Meds  Medication Sig   cyclobenzaprine  (FLEXERIL ) 10 MG tablet Take 1 tablet (10 mg total) by mouth 3 (three) times daily as needed for muscle spasms.   Ferrous Sulfate (IRON) 325 (65 FE) MG TABS Take 65 mg by mouth daily.   glucosamine-chondroitin 500-400 MG tablet Take 1 tablet by mouth 3 (three) times daily.   losartan  (COZAAR ) 50 MG tablet Take 1 tablet (50 mg total) by mouth daily.   NON FORMULARY Take 1 tablet by mouth at bedtime. CBD     Allergies:   Banana, Gluten meal, Influenza vac split quad, Peanut (diagnostic), and Tilactase   Social History   Socioeconomic History   Marital status: Married    Spouse name: [name]   Number of children: 2   Years of education: 16   Highest education level: Bachelor's degree (e.g., BA, AB, BS)  Occupational History   Occupation: Social worker: [employer]  Tobacco Use   Smoking status: Former    Current packs/day: 0.00    Types: Cigarettes    Quit date:  01/19/1998    Years since quitting: 25.6   Smokeless tobacco: Never   Tobacco comments:    About age 48   Vaping Use   Vaping status: Never Used  Substance and Sexual Activity   Alcohol use: Yes    Alcohol/week: 4.0 - 8.0 standard drinks of alcohol    Types: 4 - 8 Glasses of wine per week   Drug use: Yes    Comment: CBD   Sexual activity: Yes    Partners: Male  Other Topics Concern   Not on file  Social History Narrative   Married with 2 children   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 [place]   Occasional alcohol no drugs or tobacco use at this time   Exercise-- 2 x a week    R handed   Caffeine: a C of tea and Coffee a day   Social Drivers of Manufacturing engineer Strain: Low Risk  (05/17/2023)   Overall Financial Resource Strain (CARDIA)    Difficulty of Paying Living Expenses: Not hard at all  Food Insecurity: No Food Insecurity (05/17/2023)   Hunger Vital Sign    Worried About Running Out of Food in the Last Year: Never true    Ran Out of Food in the Last Year: Never true  Transportation Needs: No Transportation Needs (05/17/2023)   PRAPARE - Administrator, Civil Service (Medical): No    Lack of Transportation (Non-Medical): No  Physical Activity: Sufficiently Active (05/17/2023)   Exercise Vital Sign    Days of Exercise per Week: 5 days    Minutes of Exercise per Session: 40 min  Stress: Stress Concern Present (05/17/2023)   [employer] of Occupational Health - Occupational Stress Questionnaire    Feeling of Stress : Very much  Social Connections: Moderately Isolated (05/17/2023)   Social Connection and Isolation Panel    Frequency of Communication with Friends and Family: More than three times a week    Frequency of Social Gatherings with Friends and Family: Once a week    Attends Religious Services: Never    Database administrator or Organizations: No    Attends Engineer, structural: Not on file    Marital Status: Married     Family History: The patient's family history includes Autoimmune disease in an other family member; Bipolar disorder in her sister; Depression in her father; Diabetes in her father; Heart attack in her father; Hyperlipidemia in her father; Hypertension in her father, mother, sister, and another family member; Leukemia (age of onset: 2) in her mother; Lupus in her cousin; Memory loss in her mother; Multiple sclerosis in her cousin; Prostate cancer (age of onset: 34) in her father; Stroke in her maternal grandmother and mother. There is no history of Colon cancer, Esophageal cancer, Stomach cancer, Rectal cancer, or Colon polyps. ROS:   Please see the history of present  illness.    All 14 point review of systems negative except as described per history of present illness  EKGs/Labs/Other Studies Reviewed:         Recent Labs: 05/18/2023: ALT 25; BUN 10; Creatinine, Ser 0.76; Hemoglobin 14.4; Platelets 304.0; Potassium 4.0; Sodium 141; TSH 1.70  Recent Lipid Panel    Component Value Date/Time   CHOL 191 05/18/2023 1039   TRIG 63.0 05/18/2023 1039   HDL 85.40 05/18/2023 1039   CHOLHDL 2 05/18/2023 1039   VLDL 12.6 05/18/2023 1039   LDLCALC 93 05/18/2023 1039  Physical Exam:    VS:  BP 116/82 (BP Location: Left Arm, Patient Position: Sitting)   Pulse (!) 58   Ht 5' 6 (1.676 m)   Wt 124 lb (56.2 kg)   LMP 02/24/2019   SpO2 95%   BMI 20.01 kg/m     Wt Readings from Last 3 Encounters:  08/24/23 124 lb (56.2 kg)  07/09/23 125 lb (56.7 kg)  05/18/23 124 lb 6.4 oz (56.4 kg)     GEN:  Well nourished, well developed in no acute distress HEENT: Normal NECK: No JVD; No carotid bruits LYMPHATICS: No lymphadenopathy CARDIAC: RRR, no murmurs, no rubs, no gallops RESPIRATORY:  Clear to auscultation without rales, wheezing or rhonchi  ABDOMEN: Soft, non-tender, non-distended MUSCULOSKELETAL:  No edema; No deformity  SKIN: Warm and dry LOWER EXTREMITIES: no swelling NEUROLOGIC:  Alert and oriented x 3 PSYCHIATRIC:  Normal affect   ASSESSMENT:    1. Nonsustained ventricular tachycardia (HCC)   2. Primary hypertension   3. Vasovagal syncope    PLAN:    In order of problems listed above:  Vasovagal syncope we spent a great deal of time talking about this I explained to him the mechanism and what to do to prevent those episodes from reaching the point of passing out.  Encourage plenty of fluid liberation of salt.  I demonstrated counterpressure maneuvers.  We did talk about potentially wearing elastic stockings.  Of course this is will be difficult in situation like that.  She is also very special diet trying to avoid a lot of food product and  that is being addressed by different physician. Nonsustained ventricular tachycardia new diagnosis apprising I do not think have anything to do with episode of syncope.  She did have echocardiogram showing normal ejection fraction we will schedule her to have plain EKG treadmill stress test to make sure she does not have any inducible arrhythmia inducible ischemia which I doubt. Essential hypertension blood pressure controlled   Medication Adjustments/Labs and Tests Ordered: Current medicines are reviewed at length with the patient today.  Concerns regarding medicines are outlined above.  No orders of the defined types were placed in this encounter.  Medication changes: No orders of the defined types were placed in this encounter.   Signed, [name] DOROTHA Fitch, MD, [facility] 08/24/2023 1:33 PM     [facility]

## 2023-08-24 NOTE — Patient Instructions (Addendum)
 Medication Instructions:  Your physician recommends that you continue on your current medications as directed. Please refer to the Current Medication list given to you today.  *If you need a refill on your cardiac medications before your next appointment, please call your pharmacy*   Lab Work: None Ordered If you have labs (blood work) drawn today and your tests are completely normal, you will receive your results only by: MyChart Message (if you have MyChart) OR A paper copy in the mail If you have any lab test that is abnormal or we need to change your treatment, we will call you to review the results.   Testing/Procedures: Exercise Stress Test:  Eat a Light Breakfast  Dress to exercise    Follow-Up: At [facility], you and your health needs are our priority.  As part of our continuing mission to provide you with exceptional heart care, we have created designated Provider Care Teams.  These Care Teams include your primary Cardiologist (physician) and Advanced Practice Providers (APPs -  Physician Assistants and Nurse Practitioners) who all work together to provide you with the care you need, when you need it.  We recommend signing up for the patient portal called MyChart.  Sign up information is provided on this After Visit Summary.  MyChart is used to connect with patients for Virtual Visits (Telemedicine).  Patients are able to view lab/test results, encounter notes, upcoming appointments, etc.  Non-urgent messages can be sent to your provider as well.   To learn more about what you can do with MyChart, go to [email or web address].    Your next appointment:   3 month(s)  The format for your next appointment:   In Person  Provider:   [name] Fitch, MD    Other Instructions NA

## 2023-09-10 ENCOUNTER — Telehealth (HOSPITAL_COMMUNITY): Payer: Self-pay | Admitting: *Deleted

## 2023-09-10 ENCOUNTER — Telehealth (HOSPITAL_COMMUNITY): Payer: Self-pay

## 2023-09-10 NOTE — Telephone Encounter (Signed)
 Detailed instructions left on the patient's answering machine. [name] [name] CCT

## 2023-09-10 NOTE — Telephone Encounter (Signed)
 Left a detailed message on voicemail as a reminder about her ETT on 09/16/23 at 2:45.

## 2023-09-13 ENCOUNTER — Ambulatory Visit: Admitting: Cardiology

## 2023-09-15 DIAGNOSIS — M9901 Segmental and somatic dysfunction of cervical region: Secondary | ICD-10-CM | POA: Diagnosis not present

## 2023-09-15 DIAGNOSIS — M9903 Segmental and somatic dysfunction of lumbar region: Secondary | ICD-10-CM | POA: Diagnosis not present

## 2023-09-15 DIAGNOSIS — M9905 Segmental and somatic dysfunction of pelvic region: Secondary | ICD-10-CM | POA: Diagnosis not present

## 2023-09-15 DIAGNOSIS — M9906 Segmental and somatic dysfunction of lower extremity: Secondary | ICD-10-CM | POA: Diagnosis not present

## 2023-09-15 DIAGNOSIS — M9902 Segmental and somatic dysfunction of thoracic region: Secondary | ICD-10-CM | POA: Diagnosis not present

## 2023-09-16 ENCOUNTER — Ambulatory Visit (HOSPITAL_COMMUNITY)
Admission: RE | Admit: 2023-09-16 | Discharge: 2023-09-16 | Disposition: A | Discharge status: Home / Self Care | Source: Ambulatory Visit | Attending: Cardiology | Admitting: Cardiology

## 2023-09-16 DIAGNOSIS — I4729 Other ventricular tachycardia: Secondary | ICD-10-CM | POA: Diagnosis not present

## 2023-09-16 LAB — EXERCISE TOLERANCE TEST
Angina Index: 0
Duke Treadmill Score: 10
Estimated workload: 11.7
Exercise duration (min): 10 min
Exercise duration (sec): 0 s
MPHR: 173 {beats}/min
Peak HR: 179 {beats}/min
Percent HR: 103 %
Rest HR: 72 {beats}/min
ST Depression (mm): 0 mm

## 2023-09-17 ENCOUNTER — Ambulatory Visit: Payer: Self-pay | Admitting: Cardiology

## 2023-09-21 ENCOUNTER — Telehealth: Payer: Self-pay

## 2023-09-21 NOTE — Telephone Encounter (Signed)
 Pt viewed stress test results on My Chart per Dr. Vanetta Shawl note. Routed to PCP.

## 2023-09-21 NOTE — Telephone Encounter (Signed)
 Left message on My Chart with Stress Test results per Dr. Tonja Fray note. Routed to PCP.

## 2023-09-26 ENCOUNTER — Other Ambulatory Visit: Payer: Self-pay | Admitting: Medical Genetics

## 2023-09-29 ENCOUNTER — Encounter: Payer: Self-pay | Admitting: "Endocrinology

## 2023-09-29 ENCOUNTER — Ambulatory Visit: Admitting: "Endocrinology

## 2023-09-29 VITALS — BP 120/80 | HR 66 | Ht 66.0 in | Wt 124.0 lb

## 2023-09-29 DIAGNOSIS — R768 Other specified abnormal immunological findings in serum: Secondary | ICD-10-CM | POA: Diagnosis not present

## 2023-09-29 NOTE — Progress Notes (Signed)
 Outpatient Endocrinology Note [name] Birmingham, MD  09/29/23   NARYA BEAVIN 01-Dec-1975 [id number]  Referring Provider: Antonio Meth, Jamee SAUNDERS, * Primary Care Provider: Antonio Meth, Jamee SAUNDERS, DO Subjective  No chief complaint on file.   Assessment & Plan  Diagnoses and all orders for this visit:  Anti-TPO antibodies present -     TSH + free T4 -     TSH -     T4, free   [name] is currently not taking any thyroid  medication. Patient is currently biochemically euthyroid with + TPO.  Educated on thyroid  axis.  Recommend the following: lab today. Follows with integrative doctor and benefiting from it. Not interested in levothyroxine unless checked with her integrative doctor.  Advised to take levothyroxine first thing in the morning on empty stomach and wait at least 30 minutes to 1 hour before eating or drinking anything or taking any other medications. Space out levothyroxine by 4 hours from any acid reflux medication/fibrate/iron/calcium/multivitamin. Advised to take birth control pills and nutritional supplements in the evening. Repeat lab before next visit or sooner if symptoms of hyperthyroidism or hypothyroidism develop.  Notify us  immediately in case of significant weight gain or loss. Counseled on compliance and follow up needs.  I have reviewed current medications, nurse's notes, allergies, vital signs, past medical and surgical history, family medical history, and social history for this encounter. Counseled patient on symptoms, examination findings, lab findings, imaging results, treatment decisions and monitoring and prognosis. The patient understood the recommendations and agrees with the treatment plan. All questions regarding treatment plan were fully answered.  Return for labs today.   [name] Birmingham, MD  09/29/23   I have reviewed current medications, nurse's notes, allergies, vital signs, past medical and surgical history, family medical  history, and social history for this encounter. Counseled patient on symptoms, examination findings, lab findings, imaging results, treatment decisions and monitoring and prognosis. The patient understood the recommendations and agrees with the treatment plan. All questions regarding treatment plan were fully answered.   History of Present Illness [name] [name] is a 48 y.o. year old female who presents to our clinic with TPO antibody diagnosed in 2025.    Feels tired more than usual and cold at baseline   Compressive symptoms:  dysphagia  No dysphonia  No positional dyspnea (especially with simultaneous arms elevation)  No  Smokes  No On biotin  No Personal history of head/neck surgery/irradiation  No  Never been on thyroid  thyroid  medication Had postpartum thyroiditis  No history of thyroid  cancer in self/family   06/21/2004 ULTRASOUND OF THE THYROID :  The right lobe measures 6.3 x 1.5 x 1.8 cm. The left lobe measures 6.2 x 1.2 x 1.3 cm.   There is inhomogeneous echo pattern of both right and left lobes. No focal solid or cystic masses are present.   IMPRESSION:  1. There is mild diffuse enlargement of the thyroid  gland without evidence of focal lesions.   2. Thyroid  serum levels are thought to represent hyperthyroidism.   3. Correlation with a TSH level would be suggested as well as suggesting the patient have a radioactive iodine uptake and thyroid  scan.   Physical Exam  BP 120/80   Pulse 66   Ht 5' 6 (1.676 m)   Wt 124 lb (56.2 kg)   LMP 02/24/2019   SpO2 96%   BMI 20.01 kg/m  Constitutional: well developed, well nourished Head: normocephalic, atraumatic, no exophthalmos Eyes: sclera anicteric, no  redness Neck: no thyromegaly, no thyroid  tenderness; no nodules palpated Lungs: normal respiratory effort Neurology: alert and oriented, no fine hand tremor Skin: dry, no appreciable rashes Musculoskeletal: no appreciable defects Psychiatric: normal mood and  affect  Allergies Allergies  Allergen Reactions   Banana Diarrhea   Gluten Meal Other (See Comments)   Influenza Vac Split Quad     REACTION: pt had Guillane Barre--- can not have flu shot   Peanut (Diagnostic) Diarrhea   Tilactase     Current Medications Patient's Medications  New Prescriptions   No medications on file  Previous Medications   CYCLOBENZAPRINE  (FLEXERIL ) 10 MG TABLET    Take 1 tablet (10 mg total) by mouth 3 (three) times daily as needed for muscle spasms.   FERROUS SULFATE (IRON) 325 (65 FE) MG TABS    Take 65 mg by mouth daily.   GLUCOSAMINE-CHONDROITIN 500-400 MG TABLET    Take 1 tablet by mouth 3 (three) times daily.   LOSARTAN  (COZAAR ) 50 MG TABLET    Take 1 tablet (50 mg total) by mouth daily.   NON FORMULARY    Take 1 tablet by mouth at bedtime. CBD  Modified Medications   No medications on file  Discontinued Medications   No medications on file    Past Medical History Past Medical History:  Diagnosis Date   Allergy    Basal cell carcinoma    abdomen   Celiac disease 2009   Functional dyspepsia 09/05/2012   Gastritis    GERD (gastroesophageal reflux disease) 08/08/2012   Guillain Barr syndrome 04/01/2007   After influenza vaccine   Hyperthyroidism 04/01/2007   Hypoglycemia, unspecified 10/26/2007    Past Surgical History Past Surgical History:  Procedure Laterality Date   CESAREAN SECTION     COLONOSCOPY  05/12/2007   [place]   ESOPHAGOGASTRODUODENOSCOPY     SKIN CANCER EXCISION     abdomin   WISDOM TOOTH EXTRACTION      Family History family history includes Autoimmune disease in an other family member; Bipolar disorder in her sister; Depression in her father; Diabetes in her father; Heart attack in her father; Hyperlipidemia in her father; Hypertension in her father, mother, sister, and another family member; Leukemia (age of onset: 42) in her mother; Lupus in her cousin; Memory loss in her mother; Multiple sclerosis in her cousin;  Prostate cancer (age of onset: 69) in her father; Stroke in her maternal grandmother and mother.  Social History Social History   Socioeconomic History   Marital status: Married    Spouse name: [name]   Number of children: 2   Years of education: 16   Highest education level: Bachelor's degree (e.g., BA, AB, BS)  Occupational History   Occupation: Social worker: [employer]  Tobacco Use   Smoking status: Former    Current packs/day: 0.00    Types: Cigarettes    Quit date: 01/19/1998    Years since quitting: 25.7   Smokeless tobacco: Never   Tobacco comments:    About age 61   Vaping Use   Vaping status: Never Used  Substance and Sexual Activity   Alcohol use: Yes    Alcohol/week: 4.0 - 8.0 standard drinks of alcohol    Types: 4 - 8 Glasses of wine per week   Drug use: Yes    Comment: CBD   Sexual activity: Yes    Partners: Male  Other Topics Concern   Not on file  Social History Narrative  Married with 2 children   Works for [employer] in [place]   Occasional alcohol no drugs or tobacco use at this time   Exercise-- 2 x a week    R handed   Caffeine: a C of tea and Coffee a day   Social Drivers of Corporate investment banker Strain: Low Risk  (05/17/2023)   Overall Financial Resource Strain (CARDIA)    Difficulty of Paying Living Expenses: Not hard at all  Food Insecurity: No Food Insecurity (05/17/2023)   Hunger Vital Sign    Worried About Running Out of Food in the Last Year: Never true    Ran Out of Food in the Last Year: Never true  Transportation Needs: No Transportation Needs (05/17/2023)   PRAPARE - Administrator, Civil Service (Medical): No    Lack of Transportation (Non-Medical): No  Physical Activity: Sufficiently Active (05/17/2023)   Exercise Vital Sign    Days of Exercise per Week: 5 days    Minutes of Exercise per Session: 40 min  Stress: Stress Concern Present (05/17/2023)   [employer] of Occupational Health - Occupational Stress Questionnaire    Feeling of Stress : Very much  Social Connections: Moderately Isolated (05/17/2023)   Social Connection and Isolation Panel    Frequency of Communication with Friends and Family: More than three times a week    Frequency of Social Gatherings with Friends and Family: Once a week    Attends Religious Services: Never    Database administrator or Organizations: No    Attends Banker Meetings: Not on file    Marital Status: Married  Intimate Partner Violence: Not on file    Laboratory Investigations Lab Results  Component Value Date   TSH 1.70 05/18/2023   TSH 1.25 12/24/2022   TSH 1.65 05/07/2022   FREET4 0.8 05/22/2009   FREET4 0.7 04/01/2007     No results found for: TSI   No components found for: TRAB   Lab Results  Component Value Date   CHOL 191 05/18/2023   Lab Results  Component Value Date   HDL 85.40 05/18/2023   Lab Results  Component Value Date   LDLCALC 93 05/18/2023   Lab Results  Component Value Date   TRIG 63.0 05/18/2023   Lab Results  Component Value Date   CHOLHDL 2 05/18/2023   Lab Results  Component Value Date   CREATININE 0.76 05/18/2023   Lab Results  Component Value Date   GFR 93.66 05/18/2023      Component Value Date/Time   NA 141 05/18/2023 1039   K 4.0 05/18/2023 1039   CL 103 05/18/2023 1039   CO2 29 05/18/2023 1039   GLUCOSE 85 05/18/2023 1039   BUN 10 05/18/2023 1039   CREATININE 0.76 05/18/2023 1039   CALCIUM 9.9 05/18/2023 1039   PROT 7.4 05/18/2023 1039   ALBUMIN 4.9 05/18/2023 1039   AST 21 05/18/2023 1039   ALT 25 05/18/2023 1039   ALKPHOS 41 05/18/2023 1039   BILITOT 0.7 05/18/2023 1039   GFRNONAA 102.49 05/22/2009 0902   GFRAA 126 04/25/2008 0932      Latest Ref Rng & Units 05/18/2023   10:39 AM 12/24/2022    9:42 AM 09/15/2022    7:28 AM  BMP  Glucose 70 - 99 mg/dL 85  86  82   BUN 6 - 23 mg/dL 10  10  7    Creatinine 0.40 -  1.20  mg/dL 9.23  9.14  9.10   Sodium 135 - 145 mEq/L 141  137  135   Potassium 3.5 - 5.1 mEq/L 4.0  4.5  4.4   Chloride 96 - 112 mEq/L 103  104  106   CO2 19 - 32 mEq/L 29  26  20    Calcium 8.4 - 10.5 mg/dL 9.9  9.3  9.2        Component Value Date/Time   WBC 4.3 05/18/2023 1039   RBC 4.66 05/18/2023 1039   HGB 14.4 05/18/2023 1039   HCT 42.3 05/18/2023 1039   PLT 304.0 05/18/2023 1039   MCV 90.7 05/18/2023 1039   MCHC 34.0 05/18/2023 1039   RDW 12.8 05/18/2023 1039   LYMPHSABS 1.9 05/18/2023 1039   MONOABS 0.3 05/18/2023 1039   EOSABS 0.0 05/18/2023 1039   BASOSABS 0.1 05/18/2023 1039      Parts of this note may have been dictated using voice recognition software. There may be variances in spelling and vocabulary which are unintentional. Not all errors are proofread. Please notify the dino if any discrepancies are noted or if the meaning of any statement is not clear.

## 2023-09-30 LAB — T4, FREE: Free T4: 1.4 ng/dL (ref 0.8–1.8)

## 2023-09-30 LAB — TSH: TSH: 1.4 m[IU]/L

## 2023-10-19 ENCOUNTER — Other Ambulatory Visit

## 2023-10-19 DIAGNOSIS — Z006 Encounter for examination for normal comparison and control in clinical research program: Secondary | ICD-10-CM

## 2023-10-20 DIAGNOSIS — N951 Menopausal and female climacteric states: Secondary | ICD-10-CM | POA: Diagnosis not present

## 2023-10-20 DIAGNOSIS — E611 Iron deficiency: Secondary | ICD-10-CM | POA: Diagnosis not present

## 2023-10-20 DIAGNOSIS — K900 Celiac disease: Secondary | ICD-10-CM | POA: Diagnosis not present

## 2023-10-20 DIAGNOSIS — Z1321 Encounter for screening for nutritional disorder: Secondary | ICD-10-CM | POA: Diagnosis not present

## 2023-10-22 ENCOUNTER — Encounter: Payer: Self-pay | Admitting: Cardiology

## 2023-10-22 ENCOUNTER — Telehealth: Payer: Self-pay

## 2023-10-22 DIAGNOSIS — R55 Syncope and collapse: Secondary | ICD-10-CM

## 2023-10-22 NOTE — Telephone Encounter (Signed)
 Carotid US  and Neuology referral for syncope workup per Dr. Krasowski.

## 2023-10-28 DIAGNOSIS — Z1329 Encounter for screening for other suspected endocrine disorder: Secondary | ICD-10-CM | POA: Diagnosis not present

## 2023-10-28 DIAGNOSIS — E559 Vitamin D deficiency, unspecified: Secondary | ICD-10-CM | POA: Diagnosis not present

## 2023-10-28 DIAGNOSIS — E611 Iron deficiency: Secondary | ICD-10-CM | POA: Diagnosis not present

## 2023-10-28 DIAGNOSIS — K900 Celiac disease: Secondary | ICD-10-CM | POA: Diagnosis not present

## 2023-10-28 DIAGNOSIS — K580 Irritable bowel syndrome with diarrhea: Secondary | ICD-10-CM | POA: Diagnosis not present

## 2023-10-28 DIAGNOSIS — N951 Menopausal and female climacteric states: Secondary | ICD-10-CM | POA: Diagnosis not present

## 2023-10-28 DIAGNOSIS — G4700 Insomnia, unspecified: Secondary | ICD-10-CM | POA: Diagnosis not present

## 2023-10-28 DIAGNOSIS — E063 Autoimmune thyroiditis: Secondary | ICD-10-CM | POA: Diagnosis not present

## 2023-10-29 LAB — GENECONNECT MOLECULAR SCREEN

## 2023-11-02 ENCOUNTER — Other Ambulatory Visit: Payer: Self-pay | Admitting: Family Medicine

## 2023-11-02 ENCOUNTER — Encounter: Payer: Self-pay | Admitting: Family Medicine

## 2023-11-02 DIAGNOSIS — M549 Dorsalgia, unspecified: Secondary | ICD-10-CM

## 2023-11-03 ENCOUNTER — Telehealth: Payer: Self-pay | Admitting: Medical Genetics

## 2023-11-03 ENCOUNTER — Ambulatory Visit: Attending: Family Medicine | Admitting: Physical Therapy

## 2023-11-03 DIAGNOSIS — R279 Unspecified lack of coordination: Secondary | ICD-10-CM | POA: Insufficient documentation

## 2023-11-03 DIAGNOSIS — M62838 Other muscle spasm: Secondary | ICD-10-CM | POA: Diagnosis not present

## 2023-11-03 DIAGNOSIS — M5459 Other low back pain: Secondary | ICD-10-CM | POA: Diagnosis not present

## 2023-11-03 DIAGNOSIS — M6281 Muscle weakness (generalized): Secondary | ICD-10-CM | POA: Insufficient documentation

## 2023-11-03 DIAGNOSIS — M549 Dorsalgia, unspecified: Secondary | ICD-10-CM | POA: Diagnosis not present

## 2023-11-03 DIAGNOSIS — R293 Abnormal posture: Secondary | ICD-10-CM | POA: Insufficient documentation

## 2023-11-03 NOTE — Telephone Encounter (Signed)
 [place] [facility] Note  11/03/2023 10:23 AM  FIRST TNP CONTACT ATTEMPT: Left message for patient to call back.

## 2023-11-03 NOTE — Therapy (Signed)
 OUTPATIENT PHYSICAL THERAPY THORACOLUMBAR EVALUATION   Patient Name: [name] [name] MRN: [id number] DOB:1975/12/09, 48 y.o., female Today's Date: 11/03/2023  END OF SESSION:  PT End of Session - 11/03/23 1454     Visit Number 1    Date for PT Re-Evaluation 02/02/24    Authorization Type BCBS    PT Start Time 1452    PT Stop Time 1530    PT Time Calculation (min) 38 min    Activity Tolerance Patient tolerated treatment well    Behavior During Therapy [facility] for tasks assessed/performed          Past Medical History:  Diagnosis Date   Allergy    Basal cell carcinoma    abdomen   Celiac disease 2009   Functional dyspepsia 09/05/2012   Gastritis    GERD (gastroesophageal reflux disease) 08/08/2012   Guillain Barr syndrome 04/01/2007   After influenza vaccine   Hyperthyroidism 04/01/2007   Hypoglycemia, unspecified 10/26/2007   Past Surgical History:  Procedure Laterality Date   CESAREAN SECTION     COLONOSCOPY  05/12/2007   [place]   ESOPHAGOGASTRODUODENOSCOPY     SKIN CANCER EXCISION     abdomin   WISDOM TOOTH EXTRACTION     Patient Active Problem List   Diagnosis Date Noted   Nonsustained ventricular tachycardia (HCC) 08/24/2023   Vasovagal syncope 07/09/2023   Hashimoto's disease 07/07/2023   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   Small intestinal bacterial overgrowth 09/13/2022   Primary hypertension 06/04/2022   Preventative health care 05/07/2022   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   Fatigue 08/20/2021   Functional dyspepsia 09/05/2012   GERD (gastroesophageal reflux disease) 08/08/2012   Acne 08/08/2012   Hypoglycemia, unspecified 10/26/2007   Celiac disease 10/26/2007   Hyperthyroidism 04/01/2007   Guillain Barr syndrome 04/01/2007    PCP: Antonio Cyndee [name], DO  REFERRING PROVIDER: Antonio Cyndee [name], DO  REFERRING DIAG:  M54.9 (ICD-10-CM) - Mid back pain  Rationale for Evaluation and Treatment: Rehabilitation  THERAPY DIAG:  Other low back pain  Muscle weakness (generalized)  Unspecified lack of coordination  Abnormal posture  Other muscle spasm  ONSET DATE: unsure specifically ~10/24/23    SUBJECTIVE:                                                                                                                                                                                           SUBJECTIVE STATEMENT: Still doing HEP from previous PT. Was doing well since last back PT but started having some pain and got  a massage and noticed a lot of tightness and pain and wanted to get back in to prevent from getting worse.   PERTINENT HISTORY:  Guillain Barr syndrome, Hyperthyroidism, Hashimoto's disease, CESAREAN SECTION, Celiac disease  PAIN:  Are you having pain? Yes: NPRS scale: 3/10 Pain location: mid back  Pain description: achy Aggravating factors: working, sitting,  Relieving factors: heat, muscle relaxors, low impact/tai chi based exercises, end of work day    PRECAUTIONS: None  RED FLAGS: None   WEIGHT BEARING RESTRICTIONS: No  FALLS:  Has patient fallen in last 6 months? No  LIVING ENVIRONMENT: Lives with: lives with their family   OCCUPATION: desk job, works at home   PLOF: Independent  PATIENT GOALS: to have no pain with ending work day  NEXT MD VISIT: no   OBJECTIVE:  Note: Objective measures were completed at Evaluation unless otherwise noted.  DIAGNOSTIC FINDINGS:    PATIENT SURVEYS:  Oswestry Score: 11 / 50 or 22 %  COGNITION: Overall cognitive status: Within functional limits for tasks assessed     SENSATION: WFL  MUSCLE LENGTH: Bil hip flexors tight and adductors minimally   POSTURE: rounded shoulders and increased thoracic kyphosis  PALPATION: TTP and tightness noted in bil thoracic and lumbar paraspinals   LUMBAR ROM:   AROM eval  Flexion WFL   Extension Limited by 25%  Right lateral flexion WFL  Left lateral flexion WFL  Right rotation Limited by 25%  Left rotation Limited by 25%   (Blank rows = not tested)  LOWER EXTREMITY ROM:     WFL  LOWER EXTREMITY MMT:    Bil hips grossly 4+/5   FUNCTIONAL TESTS:  Sit up test 2/3  GAIT: WFL  TREATMENT DATE:   11/03/23:Examination completed, findings reviewed, pt educated on POC, HEP, manual at bil paraspinals with addaday pt in prone L1 and STM at Lt paraspinals which were most restricted . Pt motivated to participate in PT and agreeable to attempt recommendations.                                                                                                                             PATIENT EDUCATION:  Education details: Pensions consultant Person educated: Patient Education method: Solicitor, Actor cues, Verbal cues, and Handouts Education comprehension: verbalized understanding, returned demonstration, verbal cues required, tactile cues required, and needs further education  HOME EXERCISE PROGRAM: Access Code: [email or web address] URL: [email or web address] Date: 11/03/2023 Prepared by: Darryle   Exercises - Cat Cow  - 1 x daily - 7 x weekly - 1 sets - 10 reps - Tail Wag  - 1 x daily - 7 x weekly - 1 sets - 10 reps - Child's Pose with Thread the Needle  - 1 x daily - 7 x weekly - 1 sets - 5 reps - 15s holds - Quadruped Full Range Thoracic Rotation with Reach  - 1 x daily - 7 x weekly - 1 sets - 10 reps -  Sidelying Open Book Thoracic Lumbar Rotation and Extension  - 1 x daily - 7 x weekly - 1 sets - 10 reps  ASSESSMENT:  CLINICAL IMPRESSION: Patient is a 48 y.o. female  who was seen today for physical therapy evaluation and treatment for mid back pain. Pt found to have tension with replicated pain with palpation at mid thoracic paraspinals with decreased mobility noted in assessment. Pt demonstrated decreased spinal mobility in rotation, extension. Pt  reports she does sit a lot for work and unsure if this is contributing but does have recent onset of this mid back pain despite attempting to move as able during work day. Demonstrated decreased core strength and mild hip weakness. Pt had 11/50 on Oswestry but states she tried to get into PT as soon as able to prevent pain from getting worse. Pt very motivated to participate and improve. Pt would benefit from additional PT to further address deficits.   OBJECTIVE IMPAIRMENTS: decreased activity tolerance, decreased coordination, decreased endurance, decreased mobility, decreased strength, increased fascial restrictions, increased muscle spasms, impaired flexibility, improper body mechanics, postural dysfunction, and pain.   ACTIVITY LIMITATIONS: carrying, lifting, sitting, standing, and squatting  PARTICIPATION LIMITATIONS: meal prep, community activity, and occupation  PERSONAL FACTORS: 1 comorbidity: history of back pain are also affecting patient's functional outcome.   REHAB POTENTIAL: Good  CLINICAL DECISION MAKING: Stable/uncomplicated  EVALUATION COMPLEXITY: Low   GOALS: Goals reviewed with patient? Yes  SHORT TERM GOALS: Target date: 12/01/23  Pt to be I with HEP for carry over and continuing recommendations for improved outcomes.   Baseline: Goal status: INITIAL  2.  Pt to report no more than 2/10 back pain for improved tolerance to sitting for work at least 2 hours.  Baseline:  Goal status: INITIAL  3.  Pt to demonstrate full ROM at thoracic spine for improved posture at midline and upright tolerance.  Baseline:  Goal status: INITIAL   LONG TERM GOALS: Target date: 02/02/24  Pt to be I with advanced HEP for carry over and continuing recommendations for improved outcomes.   Baseline:  Goal status: INITIAL  2.  Pt to score no higher than 5/50 owestry indicating improvement in functional mobility with less to no pain.  Baseline:  Goal status: INITIAL  3.  Pt to  report 0/10 back pain for improved tolerance to sitting for work at least 2 hours.   Baseline:  Goal status: INITIAL  4.  Pt to report ability to walk at least 1 hour without increased pain for improved ability to have prior level of activity and increase fitness goals. Baseline:  Goal status: INITIAL   PLAN:  PT FREQUENCY: 2x/week  PT DURATION: 6 weeks  PLANNED INTERVENTIONS: 97110-Therapeutic exercises, 97530- Therapeutic activity, [id number]- Neuromuscular re-education, 97535- Self Care, 02859- Manual therapy, [phone]- Canalith repositioning, [id number]- Aquatic Therapy, [phone]- Electrical stimulation (manual), [id number]- Vasopneumatic device, [id number]- Ultrasound, [id number]- Traction (mechanical), [id number]- Ionotophoresis 4mg /ml Dexamethasone, [id number] (1-2 muscles), [id number] (3+ muscles)- Dry Needling, Patient/Family education, Balance training, Stair training, Taping, Joint mobilization, Joint manipulation, Spinal manipulation, Spinal mobilization, Scar mobilization, DME instructions, Cryotherapy, Moist heat, and Biofeedback.  PLAN FOR NEXT SESSION: dry needling?, core and posture strengthening, mobility mid back   Darryle Navy, PT, DPT 09/17/252:55 PM  [facility] [address] [place], [place] [address] Phone # [phone] Fax [phone]

## 2023-11-05 ENCOUNTER — Ambulatory Visit (HOSPITAL_COMMUNITY)
Admission: RE | Admit: 2023-11-05 | Discharge: 2023-11-05 | Disposition: A | Discharge status: Home / Self Care | Source: Ambulatory Visit | Attending: Cardiology | Admitting: Cardiology

## 2023-11-05 DIAGNOSIS — R55 Syncope and collapse: Secondary | ICD-10-CM | POA: Insufficient documentation

## 2023-11-08 NOTE — Telephone Encounter (Signed)
 [place] [facility] Note   9/[id number] 12:28 PM   SECOND TNP CONTACT ATTEMPT: Left message for patient to call back.

## 2023-11-09 ENCOUNTER — Ambulatory Visit

## 2023-11-09 ENCOUNTER — Ambulatory Visit: Payer: Self-pay | Admitting: Cardiology

## 2023-11-09 DIAGNOSIS — M6281 Muscle weakness (generalized): Secondary | ICD-10-CM | POA: Diagnosis not present

## 2023-11-09 DIAGNOSIS — M62838 Other muscle spasm: Secondary | ICD-10-CM

## 2023-11-09 DIAGNOSIS — M5459 Other low back pain: Secondary | ICD-10-CM | POA: Diagnosis not present

## 2023-11-09 DIAGNOSIS — R279 Unspecified lack of coordination: Secondary | ICD-10-CM | POA: Diagnosis not present

## 2023-11-09 DIAGNOSIS — M549 Dorsalgia, unspecified: Secondary | ICD-10-CM | POA: Diagnosis not present

## 2023-11-09 DIAGNOSIS — R293 Abnormal posture: Secondary | ICD-10-CM

## 2023-11-09 NOTE — Therapy (Signed)
 OUTPATIENT PHYSICAL THERAPY THORACOLUMBAR TREATMENT   Patient Name: [name] [name] MRN: [id number] DOB:[birth year], 48 y.o., female Today's Date: 11/09/2023  END OF SESSION:  PT End of Session - 11/09/23 1156     Visit Number 2    Date for Recertification  02/02/24    Authorization Type BCBS    PT Start Time 1155    PT Stop Time 1242    PT Time Calculation (min) 47 min    Activity Tolerance Patient tolerated treatment well    Behavior During Therapy [facility] for tasks assessed/performed          Past Medical History:  Diagnosis Date   Allergy    Basal cell carcinoma    abdomen   Celiac disease 2009   Functional dyspepsia 09/05/2012   Gastritis    GERD (gastroesophageal reflux disease) 08/08/2012   Guillain Barr syndrome 04/01/2007   After influenza vaccine   Hyperthyroidism 04/01/2007   Hypoglycemia, unspecified 10/26/2007   Past Surgical History:  Procedure Laterality Date   CESAREAN SECTION     COLONOSCOPY  05/12/2007   Gessner   ESOPHAGOGASTRODUODENOSCOPY     SKIN CANCER EXCISION     abdomin   WISDOM TOOTH EXTRACTION     Patient Active Problem List   Diagnosis Date Noted   Nonsustained ventricular tachycardia (HCC) 08/24/2023   Vasovagal syncope 07/09/2023   Hashimoto's disease 07/07/2023   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   Small intestinal bacterial overgrowth 09/13/2022   Primary hypertension 06/04/2022   Preventative health care 05/07/2022   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   Fatigue 08/20/2021   Functional dyspepsia 09/05/2012   GERD (gastroesophageal reflux disease) 08/08/2012   Acne 08/08/2012   Hypoglycemia, unspecified 10/26/2007   Celiac disease 10/26/2007   Hyperthyroidism 04/01/2007   Guillain Barr syndrome 04/01/2007    PCP: Antonio Cyndee [name], DO  REFERRING PROVIDER: Antonio Cyndee [name], DO  REFERRING DIAG:  M54.9 (ICD-10-CM) - Mid back pain  Rationale for Evaluation and Treatment: Rehabilitation  THERAPY DIAG:  Other low back pain  Muscle weakness (generalized)  Unspecified lack of coordination  Abnormal posture  Other muscle spasm  ONSET DATE: unsure specifically ~10/24/23    SUBJECTIVE:                                                                                                                                                                                           SUBJECTIVE STATEMENT: The new exercises are going well at home. I can see maybe a little improvement. Right now my mid back  is hurting more than my low back which is good for me because my low back is normally really bothering me.   PERTINENT HISTORY:  Guillain Barr syndrome, Hyperthyroidism, Hashimoto's disease, CESAREAN SECTION, Celiac disease  PAIN:  Are you having pain? Yes: NPRS scale: 3/10 Pain location: mid back  Pain description: achy Aggravating factors: working, sitting,  Relieving factors: heat, muscle relaxors, low impact/tai chi based exercises, end of work day, new exercises are helping some    PRECAUTIONS: None  RED FLAGS: None   WEIGHT BEARING RESTRICTIONS: No  FALLS:  Has patient fallen in last 6 months? No  LIVING ENVIRONMENT: Lives with: lives with their family   OCCUPATION: desk job, works at home   PLOF: Independent  PATIENT GOALS: to have no pain with ending work day  NEXT MD VISIT: no   OBJECTIVE:  Note: Objective measures were completed at Evaluation unless otherwise noted.  DIAGNOSTIC FINDINGS:    PATIENT SURVEYS:  Oswestry Score: 11 / 50 or 22 %  COGNITION: Overall cognitive status: Within functional limits for tasks assessed     SENSATION: WFL  MUSCLE LENGTH: Bil hip flexors tight and adductors minimally   POSTURE: rounded shoulders and increased thoracic kyphosis  PALPATION: TTP and tightness noted in bil thoracic and lumbar paraspinals   LUMBAR ROM:    AROM eval  Flexion WFL  Extension Limited by 25%  Right lateral flexion WFL  Left lateral flexion WFL  Right rotation Limited by 25%  Left rotation Limited by 25%   (Blank rows = not tested)  LOWER EXTREMITY ROM:     WFL  LOWER EXTREMITY MMT:    Bil hips grossly 4+/5   FUNCTIONAL TESTS:  Sit up test 2/3  GAIT: WFL  TREATMENT DATE:  11/09/23: Therapeutic Exercises NuStep Level 5, UE 8/LE 7, x 5 mins for warm up Free Motion Machine for thoracic rotation with core engaged for core stability 7# x 10 each side, returning therapist demo Squat in front of chair for glut tap with 3# weights x 10 and VC's for complete end motion with glut and core activation, pt reports feeling low back muscles working, not hurting Seated in chair and thoracic ext over back of chair stretch x 5 reps Supine over full foam roll horizontally first for thoracic ext x 5 reps, pt reports good stretch but felt better stretch in chair Therapeutic Activities Supine over full foam roll now vertically for bil UE scaption in a V x 10; then dead bug x 10 with VC's for TA activation throughout Supine on mat: Posterior pelvic tilt x 3 with excellent technique, then ppt with following: alt march x 10 each, then alt LE ext x 10 each;  Reverse curl x 10, then with obliques x 5 each side returning demo Quadruped for cat/cow x 5 Google with and without thread the needle x 5 reps Sidelying Open Book Thoracic Lumbar Rotation and Extension x 5 each Manual Therapy STM in prone with cocoa butter to bil thoracic paraspinals, also dynamic cupping to same intermittently during STM x 12 mins   11/03/23:Examination completed, findings reviewed, pt educated on POC, HEP, manual at bil paraspinals with addaday pt in prone L1 and STM at Lt paraspinals which were most restricted . Pt motivated to participate in PT and agreeable to attempt recommendations.  PATIENT EDUCATION:  Education details: Pensions consultant Person educated: Patient Education method: Solicitor, Actor cues, Verbal cues, and Handouts Education comprehension: verbalized understanding, returned demonstration, verbal cues required, tactile cues required, and needs further education  HOME EXERCISE PROGRAM: Access Code: [email or web address] URL: [email or web address] Date: 11/03/2023 Prepared by: Darryle   Exercises - Cat Cow  - 1 x daily - 7 x weekly - 1 sets - 10 reps - Tail Wag  - 1 x daily - 7 x weekly - 1 sets - 10 reps - Child's Pose with Thread the Needle  - 1 x daily - 7 x weekly - 1 sets - 5 reps - 15s holds - Quadruped Full Range Thoracic Rotation with Reach  - 1 x daily - 7 x weekly - 1 sets - 10 reps - Sidelying Open Book Thoracic Lumbar Rotation and Extension  - 1 x daily - 7 x weekly - 1 sets - 10 reps  ASSESSMENT:  CLINICAL IMPRESSION: Pt reports new HEP stretches are going well with some relief noted. Today progressed pt to include core strength and bil LE and thoracic flexibility. Also discussed ergonomics of desk set up, pt feels she has this set up as good as able with her work tasks.  Overall she was challenged by exercises and did not have any increased pain.   OBJECTIVE IMPAIRMENTS: decreased activity tolerance, decreased coordination, decreased endurance, decreased mobility, decreased strength, increased fascial restrictions, increased muscle spasms, impaired flexibility, improper body mechanics, postural dysfunction, and pain.   ACTIVITY LIMITATIONS: carrying, lifting, sitting, standing, and squatting  PARTICIPATION LIMITATIONS: meal prep, community activity, and occupation  PERSONAL FACTORS: 1 comorbidity: history of back pain are also affecting patient's functional outcome.   REHAB POTENTIAL: Good  CLINICAL DECISION MAKING: Stable/uncomplicated  EVALUATION COMPLEXITY:  Low   GOALS: Goals reviewed with patient? Yes  SHORT TERM GOALS: Target date: 12/01/23  Pt to be I with HEP for carry over and continuing recommendations for improved outcomes.   Baseline: Goal status: INITIAL  2.  Pt to report no more than 2/10 back pain for improved tolerance to sitting for work at least 2 hours.  Baseline:  Goal status: INITIAL  3.  Pt to demonstrate full ROM at thoracic spine for improved posture at midline and upright tolerance.  Baseline:  Goal status: INITIAL   LONG TERM GOALS: Target date: 02/02/24  Pt to be I with advanced HEP for carry over and continuing recommendations for improved outcomes.   Baseline:  Goal status: INITIAL  2.  Pt to score no higher than 5/50 owestry indicating improvement in functional mobility with less to no pain.  Baseline:  Goal status: INITIAL  3.  Pt to report 0/10 back pain for improved tolerance to sitting for work at least 2 hours.   Baseline:  Goal status: INITIAL  4.  Pt to report ability to walk at least 1 hour without increased pain for improved ability to have prior level of activity and increase fitness goals. Baseline:  Goal status: INITIAL   PLAN:  PT FREQUENCY: 2x/week  PT DURATION: 6 weeks  PLANNED INTERVENTIONS: 97110-Therapeutic exercises, 97530- Therapeutic activity, [id number]- Neuromuscular re-education, 97535- Self Care, [id number]- Manual therapy, [phone]- Canalith repositioning, [id number]- Aquatic Therapy, [phone]- Electrical stimulation (manual), [id number]- Vasopneumatic device, [id number]- Ultrasound, [id number]- Traction (mechanical), [id number]- Ionotophoresis 4mg /ml Dexamethasone, [id number] (1-2 muscles), [id number] (3+ muscles)- Dry Needling, Patient/Family education, Balance training, Stair training, Taping, Joint mobilization, Joint manipulation, Spinal manipulation, Spinal mobilization, Scar mobilization, DME instructions, Cryotherapy, Moist heat, and Biofeedback.  PLAN FOR NEXT SESSION: dry needling, Cont core and posture  strengthening, mobility mid back  Berwyn Knights, PTA 11/09/23 12:48 PM   [facility] [address], [address] [place], [place] [address] Phone # [phone] Fax [phone]

## 2023-11-10 DIAGNOSIS — M9905 Segmental and somatic dysfunction of pelvic region: Secondary | ICD-10-CM | POA: Diagnosis not present

## 2023-11-10 DIAGNOSIS — M9902 Segmental and somatic dysfunction of thoracic region: Secondary | ICD-10-CM | POA: Diagnosis not present

## 2023-11-10 DIAGNOSIS — M9903 Segmental and somatic dysfunction of lumbar region: Secondary | ICD-10-CM | POA: Diagnosis not present

## 2023-11-10 DIAGNOSIS — M9901 Segmental and somatic dysfunction of cervical region: Secondary | ICD-10-CM | POA: Diagnosis not present

## 2023-11-11 ENCOUNTER — Telehealth: Payer: Self-pay

## 2023-11-11 NOTE — Telephone Encounter (Signed)
 Left message on My Chart with Carotid US  results per Dr. Karry note. Routed to PCP.

## 2023-11-12 ENCOUNTER — Ambulatory Visit

## 2023-11-12 ENCOUNTER — Telehealth: Payer: Self-pay

## 2023-11-12 DIAGNOSIS — M62838 Other muscle spasm: Secondary | ICD-10-CM

## 2023-11-12 DIAGNOSIS — R279 Unspecified lack of coordination: Secondary | ICD-10-CM

## 2023-11-12 DIAGNOSIS — M6281 Muscle weakness (generalized): Secondary | ICD-10-CM | POA: Diagnosis not present

## 2023-11-12 DIAGNOSIS — M549 Dorsalgia, unspecified: Secondary | ICD-10-CM | POA: Diagnosis not present

## 2023-11-12 DIAGNOSIS — R293 Abnormal posture: Secondary | ICD-10-CM

## 2023-11-12 DIAGNOSIS — M5459 Other low back pain: Secondary | ICD-10-CM | POA: Diagnosis not present

## 2023-11-12 NOTE — Therapy (Signed)
 OUTPATIENT PHYSICAL THERAPY THORACOLUMBAR TREATMENT   Patient Name: [name] [name] MRN: [id number] DOB:February 10, 1976, 48 y.o., female Today's Date: 11/12/2023  END OF SESSION:  PT End of Session - 11/12/23 1106     Visit Number 3    Date for Recertification  02/02/24    Authorization Type BCBS    PT Start Time 1104    PT Stop Time 1153    PT Time Calculation (min) 49 min    Activity Tolerance Patient tolerated treatment well    Behavior During Therapy [facility] for tasks assessed/performed          Past Medical History:  Diagnosis Date   Allergy    Basal cell carcinoma    abdomen   Celiac disease 2009   Functional dyspepsia 09/05/2012   Gastritis    GERD (gastroesophageal reflux disease) 08/08/2012   Guillain Barr syndrome 04/01/2007   After influenza vaccine   Hyperthyroidism 04/01/2007   Hypoglycemia, unspecified 10/26/2007   Past Surgical History:  Procedure Laterality Date   CESAREAN SECTION     COLONOSCOPY  05/12/2007   Gessner   ESOPHAGOGASTRODUODENOSCOPY     SKIN CANCER EXCISION     abdomin   WISDOM TOOTH EXTRACTION     Patient Active Problem List   Diagnosis Date Noted   Nonsustained ventricular tachycardia (HCC) 08/24/2023   Vasovagal syncope 07/09/2023   Hashimoto's disease 07/07/2023   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   Small intestinal bacterial overgrowth 09/13/2022   Primary hypertension 06/04/2022   Preventative health care 05/07/2022   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   Fatigue 08/20/2021   Functional dyspepsia 09/05/2012   GERD (gastroesophageal reflux disease) 08/08/2012   Acne 08/08/2012   Hypoglycemia, unspecified 10/26/2007   Celiac disease 10/26/2007   Hyperthyroidism 04/01/2007   Guillain Barr syndrome 04/01/2007    PCP: Antonio Cyndee [name], DO  REFERRING PROVIDER: Antonio Cyndee [name], DO  REFERRING DIAG:  M54.9 (ICD-10-CM) - Mid back pain  Rationale for Evaluation and Treatment: Rehabilitation  THERAPY DIAG:  Other low back pain  Muscle weakness (generalized)  Unspecified lack of coordination  Abnormal posture  Other muscle spasm  ONSET DATE: unsure specifically ~10/24/23    SUBJECTIVE:                                                                                                                                                                                           SUBJECTIVE STATEMENT: The new exercises are going well at home. I can see maybe a little improvement. Right now my mid back  is hurting more than my low back which is good for me because my low back is normally really bothering me.   PERTINENT HISTORY:  Guillain Barr syndrome, Hyperthyroidism, Hashimoto's disease, CESAREAN SECTION, Celiac disease  PAIN:  Are you having pain? No pain currently right now  PRECAUTIONS: None  RED FLAGS: None   WEIGHT BEARING RESTRICTIONS: No  FALLS:  Has patient fallen in last 6 months? No  LIVING ENVIRONMENT: Lives with: lives with their family   OCCUPATION: desk job, works at home   PLOF: Independent  PATIENT GOALS: to have no pain with ending work day  NEXT MD VISIT: no   OBJECTIVE:  Note: Objective measures were completed at Evaluation unless otherwise noted.  DIAGNOSTIC FINDINGS:    PATIENT SURVEYS:  Oswestry Score: 11 / 50 or 22 %  COGNITION: Overall cognitive status: Within functional limits for tasks assessed     SENSATION: WFL  MUSCLE LENGTH: Bil hip flexors tight and adductors minimally   POSTURE: rounded shoulders and increased thoracic kyphosis  PALPATION: TTP and tightness noted in bil thoracic and lumbar paraspinals   LUMBAR ROM:   AROM eval  Flexion WFL  Extension Limited by 25%  Right lateral flexion WFL  Left lateral flexion WFL  Right rotation Limited by 25%  Left rotation Limited by 25%   (Blank rows = not tested)  LOWER  EXTREMITY ROM:     WFL  LOWER EXTREMITY MMT:    Bil hips grossly 4+/5   FUNCTIONAL TESTS:  Sit up test 2/3  GAIT: WFL  TREATMENT DATE:  11/12/23: Therapeutic Exercises NuStep Level 6, UE 8/LE 7, x 5 mins for warm up Stretches done at end of session: Seated in chair and leaning over back of chair for thoracic stretches x 10, 5 sec holds Doorway pectoralis stretch x 5 reps, 20 sec holds Thoracic stretches holding door frame and pulling away rounding upper back in varying positions Therapeutic Activities Supine over half foam roll for bil UE scaption in a V x 10, bil UE abd in a snow angel x 5 reps; supine scapular series with red theraband (horz abd, er, narrow and wide grip flex, and D2 each UE) x 10 each and working to incorporate chin tuck during for increased postural stability  Forearms on wall with red theraband for following: Wall walks x 5 up/down, and then scap retract x 10 each, then hands on wall for clock wall taps x 5 each returning therapist demo for each Manual Therapy STM in prone with cocoa butter to bil thoracic paraspinals, but focused on Rt scapular area over multiple trigger points at medial and lateral scap borders, also dynamic cupping to same intermittently during STM x 18 mins Scap Mobs on Rt into protraction and retraction, initially very limited scap mobility noted but this improved by end of session   11/09/23: Therapeutic Exercises NuStep Level 5, UE 8/LE 7, x 5 mins for warm up Free Motion Machine for thoracic rotation with core engaged for core stability 7# x 10 each side, returning therapist demo Squat in front of chair for glut tap with 3# weights x 10 and VC's for complete end motion with glut and core activation, pt reports feeling low back muscles working, not hurting Seated in chair and thoracic ext over back of chair stretch x 5 reps Supine over full foam roll horizontally first for thoracic ext x 5 reps, pt reports good stretch but felt  better stretch in chair Therapeutic Activities Supine over full foam  roll now vertically for bil UE scaption in a V x 10; then dead bug x 10 with VC's for TA activation throughout Supine on mat: Posterior pelvic tilt x 3 with excellent technique, then ppt with following: alt march x 10 each, then alt LE ext x 10 each;  Reverse curl x 10, then with obliques x 5 each side returning demo Quadruped for cat/cow x 5 Google with and without thread the needle x 5 reps Sidelying Open Book Thoracic Lumbar Rotation and Extension x 5 each Manual Therapy STM in prone with cocoa butter to bil thoracic paraspinals, also dynamic cupping to same intermittently during STM x 12 mins   11/03/23:Examination completed, findings reviewed, pt educated on POC, HEP, manual at bil paraspinals with addaday pt in prone L1 and STM at Lt paraspinals which were most restricted . Pt motivated to participate in PT and agreeable to attempt recommendations.                                                                                                                             PATIENT EDUCATION:  Education details: Pensions consultant Person educated: Patient Education method: Solicitor, Actor cues, Verbal cues, and Handouts Education comprehension: verbalized understanding, returned demonstration, verbal cues required, tactile cues required, and needs further education  HOME EXERCISE PROGRAM: Access Code: [email or web address] URL: [email or web address] Date: 11/03/2023 Prepared by: Darryle   Exercises - Cat Cow  - 1 x daily - 7 x weekly - 1 sets - 10 reps - Tail Wag  - 1 x daily - 7 x weekly - 1 sets - 10 reps - Child's Pose with Thread the Needle  - 1 x daily - 7 x weekly - 1 sets - 5 reps - 15s holds - Quadruped Full Range Thoracic Rotation with Reach  - 1 x daily - 7 x weekly - 1 sets - 10 reps - Sidelying Open Book Thoracic Lumbar Rotation and Extension  - 1 x daily - 7 x weekly - 1 sets - 10  reps  11/12/23: - Supine Shoulder Horizontal Abduction with Resistance  - 2 x daily - 4-5 x weekly - 1-2 sets - 10 reps - 3 sec hold - Supine Shoulder External Rotation with Resistance  - 2 x daily - 4-5 x weekly - 1-2 sets - 10 reps - 3 sec hold - Supine narrow and wide grip flexion  - 4 x daily - 4-5 x weekly - 1-2 sets - 10 reps - 3 sec hold - Supine PNF D2   - 1 x daily - 4-5 x weekly - 1-2 sets - 10 reps - 3 sec hold - Forearm Walks on Wall with Resistance Band  - 1 x daily - 7 x weekly - 1-2 sets - 10 reps - Reverse Crunch  - 1 x daily - 7 x weekly - 2 sets - 10 reps   ASSESSMENT:  CLINICAL IMPRESSION: Pt reports feeling a little looser in thoracic  spine since being here last. She felt good after session and then saw a chiropractor yesterday as well. Today progressed postural strength and stability with new exs and added these to HEP as well.  Then continued with manual therapy as pt reports Rt thoracic area feels tight and in fact was very tight with multiple trigger points palpable with limited Rt scap mobility initially but this was some improved by end of session.   OBJECTIVE IMPAIRMENTS: decreased activity tolerance, decreased coordination, decreased endurance, decreased mobility, decreased strength, increased fascial restrictions, increased muscle spasms, impaired flexibility, improper body mechanics, postural dysfunction, and pain.   ACTIVITY LIMITATIONS: carrying, lifting, sitting, standing, and squatting  PARTICIPATION LIMITATIONS: meal prep, community activity, and occupation  PERSONAL FACTORS: 1 comorbidity: history of back pain are also affecting patient's functional outcome.   REHAB POTENTIAL: Good  CLINICAL DECISION MAKING: Stable/uncomplicated  EVALUATION COMPLEXITY: Low   GOALS: Goals reviewed with patient? Yes  SHORT TERM GOALS: Target date: 12/01/23  Pt to be I with HEP for carry over and continuing recommendations for improved outcomes.   Baseline: Goal  status: INITIAL  2.  Pt to report no more than 2/10 back pain for improved tolerance to sitting for work at least 2 hours.  Baseline:  Goal status: INITIAL  3.  Pt to demonstrate full ROM at thoracic spine for improved posture at midline and upright tolerance.  Baseline:  Goal status: INITIAL   LONG TERM GOALS: Target date: 02/02/24  Pt to be I with advanced HEP for carry over and continuing recommendations for improved outcomes.   Baseline:  Goal status: INITIAL  2.  Pt to score no higher than 5/50 owestry indicating improvement in functional mobility with less to no pain.  Baseline:  Goal status: INITIAL  3.  Pt to report 0/10 back pain for improved tolerance to sitting for work at least 2 hours.   Baseline:  Goal status: INITIAL  4.  Pt to report ability to walk at least 1 hour without increased pain for improved ability to have prior level of activity and increase fitness goals. Baseline:  Goal status: INITIAL   PLAN:  PT FREQUENCY: 2x/week  PT DURATION: 6 weeks  PLANNED INTERVENTIONS: 97110-Therapeutic exercises, 97530- Therapeutic activity, [id number]- Neuromuscular re-education, 97535- Self Care, 02859- Manual therapy, [phone]- Canalith repositioning, [id number]- Aquatic Therapy, [phone]- Electrical stimulation (manual), [id number]- Vasopneumatic device, [id number]- Ultrasound, [id number]- Traction (mechanical), [id number]- Ionotophoresis 4mg /ml Dexamethasone, 79439 (1-2 muscles), [id number] (3+ muscles)- Dry Needling, Patient/Family education, Balance training, Stair training, Taping, Joint mobilization, Joint manipulation, Spinal manipulation, Spinal mobilization, Scar mobilization, DME instructions, Cryotherapy, Moist heat, and Biofeedback.  PLAN FOR NEXT SESSION: Add dry needling prn, Cont core and posture strengthening and review/progress HEP prn, mobility mid back, cont manual therapy prn  Berwyn Knights, PTA 11/12/23 12:01 PM   [facility] [address] [place], [place] [address] Phone # [phone] Fax [phone]

## 2023-11-12 NOTE — Telephone Encounter (Signed)
 Pt viewed [place] results on My Chart per Dr. Vanetta Shawl note. Routed to PCP.

## 2023-11-19 NOTE — Telephone Encounter (Signed)
 11/19/23 10:44 AM  3rd TNP attempt: sent MyChart message

## 2023-11-24 ENCOUNTER — Ambulatory Visit: Attending: Family Medicine

## 2023-11-24 DIAGNOSIS — R252 Cramp and spasm: Secondary | ICD-10-CM | POA: Insufficient documentation

## 2023-11-24 DIAGNOSIS — M62838 Other muscle spasm: Secondary | ICD-10-CM | POA: Diagnosis not present

## 2023-11-24 DIAGNOSIS — M6281 Muscle weakness (generalized): Secondary | ICD-10-CM | POA: Insufficient documentation

## 2023-11-24 DIAGNOSIS — R293 Abnormal posture: Secondary | ICD-10-CM | POA: Insufficient documentation

## 2023-11-24 DIAGNOSIS — M5459 Other low back pain: Secondary | ICD-10-CM | POA: Insufficient documentation

## 2023-11-24 NOTE — Therapy (Signed)
 OUTPATIENT PHYSICAL THERAPY THORACOLUMBAR TREATMENT   Patient Name: NIRALI MAGOUIRK MRN: [id number] DOB:03-02-1975, 48 y.o., female Today's Date: 11/24/2023  END OF SESSION:  PT End of Session - 11/24/23 1236     Visit Number 4    Date for Recertification  02/02/24    Authorization Type [employer]    PT Start Time 1236    PT Stop Time 1325    PT Time Calculation (min) 49 min    Activity Tolerance Patient tolerated treatment well    Behavior During Therapy [facility] for tasks assessed/performed          Past Medical History:  Diagnosis Date   Allergy    Basal cell carcinoma    abdomen   Celiac disease 2009   Functional dyspepsia 09/05/2012   Gastritis    GERD (gastroesophageal reflux disease) 08/08/2012   Guillain Barr syndrome 04/01/2007   After influenza vaccine   Hyperthyroidism 04/01/2007   Hypoglycemia, unspecified 10/26/2007   Past Surgical History:  Procedure Laterality Date   CESAREAN SECTION     COLONOSCOPY  05/12/2007   Gessner   ESOPHAGOGASTRODUODENOSCOPY     SKIN CANCER EXCISION     abdomin   WISDOM TOOTH EXTRACTION     Patient Active Problem List   Diagnosis Date Noted   Nonsustained ventricular tachycardia (HCC) 08/24/2023   Vasovagal syncope 07/09/2023   Hashimoto's disease 07/07/2023   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   Small intestinal bacterial overgrowth 09/13/2022   Primary hypertension 06/04/2022   Preventative health care 05/07/2022   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   Fatigue 08/20/2021   Functional dyspepsia 09/05/2012   GERD (gastroesophageal reflux disease) 08/08/2012   Acne 08/08/2012   Hypoglycemia, unspecified 10/26/2007   Celiac disease 10/26/2007   Hyperthyroidism 04/01/2007   Guillain Barr syndrome 04/01/2007    PCP: Antonio Cyndee [name], DO  REFERRING PROVIDER: Antonio Cyndee [name], DO  REFERRING DIAG:  M54.9 (ICD-10-CM) - Mid back pain  Rationale for Evaluation and Treatment: Rehabilitation  THERAPY DIAG:  Other low back pain  Muscle weakness (generalized)  Abnormal posture  Cramp and spasm  Other muscle spasm  ONSET DATE: unsure specifically ~10/24/23    SUBJECTIVE:                                                                                                                                                                                           SUBJECTIVE STATEMENT: Not much better  PERTINENT HISTORY:  Guillain Barr syndrome, Hyperthyroidism, Hashimoto's disease, CESAREAN SECTION, Celiac disease  PAIN:  Are  you having pain? No pain currently right now  PRECAUTIONS: None  RED FLAGS: None   WEIGHT BEARING RESTRICTIONS: No  FALLS:  Has patient fallen in last 6 months? No  LIVING ENVIRONMENT: Lives with: lives with their family   OCCUPATION: desk job, works at home   PLOF: Independent  PATIENT GOALS: to have no pain with ending work day  NEXT MD VISIT: no   OBJECTIVE:  Note: Objective measures were completed at Evaluation unless otherwise noted.  DIAGNOSTIC FINDINGS:    PATIENT SURVEYS:  Oswestry Score: 11 / 50 or 22 %  COGNITION: Overall cognitive status: Within functional limits for tasks assessed     SENSATION: WFL  MUSCLE LENGTH: Bil hip flexors tight and adductors minimally   POSTURE: rounded shoulders and increased thoracic kyphosis  PALPATION: TTP and tightness noted in bil thoracic and lumbar paraspinals   LUMBAR ROM:   AROM eval  Flexion WFL  Extension Limited by 25%  Right lateral flexion WFL  Left lateral flexion WFL  Right rotation Limited by 25%  Left rotation Limited by 25%   (Blank rows = not tested)  LOWER EXTREMITY ROM:     WFL  LOWER EXTREMITY MMT:    Bil hips grossly 4+/5   FUNCTIONAL TESTS:  Sit up test 2/3  GAIT: WFL  TREATMENT DATE:  11/24/23: Nustep x 6 min level 4 Prone wing taps 2 x  10 Prone reach and pull 2 x 10 Quadruped thread the needle with thoracic rotation x 10 each side Side lying open book x 10 each side Trigger Point Dry Needling Initial Treatment: Pt instructed on Dry Needling rational, procedures, and possible side effects. Pt instructed to expect mild to moderate muscle soreness later in the day and/or into the next day.  Pt instructed in methods to reduce muscle soreness. Pt instructed to continue prescribed HEP. Because Dry Needling was performed over or adjacent to a lung field, pt was educated on S/S of pneumothorax and to seek immediate medical attention should they occur.  Patient was educated on signs and symptoms of infection and other risk factors and advised to seek medical attention should they occur.  Patient verbalized understanding of these instructions and education.  Patient Verbal Consent Given: Yes Education Handout Provided: Yes Muscles Treated: rhomboids Electrical Stimulation Performed: No Treatment Response/Outcome: Skilled palpation used to identify taut bands and trigger points.  Once identified, dry needling techniques used to treat these areas.  Twitch response ellicited along with palpable elongation of muscle.  Following treatment, patient reported significant decrease in muscle tension    11/12/23: Therapeutic Exercises NuStep Level 6, UE 8/LE 7, x 5 mins for warm up Stretches done at end of session: Seated in chair and leaning over back of chair for thoracic stretches x 10, 5 sec holds Doorway pectoralis stretch x 5 reps, 20 sec holds Thoracic stretches holding door frame and pulling away rounding upper back in varying positions Therapeutic Activities Supine over half foam roll for bil UE scaption in a V x 10, bil UE abd in a snow angel x 5 reps; supine scapular series with red theraband (horz abd, er, narrow and wide grip flex, and D2 each UE) x 10 each and working to incorporate chin tuck during for increased postural  stability  Forearms on wall with red theraband for following: Wall walks x 5 up/down, and then scap retract x 10 each, then hands on wall for clock wall taps x 5 each returning therapist demo for each Manual  Therapy STM in prone with cocoa butter to bil thoracic paraspinals, but focused on Rt scapular area over multiple trigger points at medial and lateral scap borders, also dynamic cupping to same intermittently during STM x 18 mins Scap Mobs on Rt into protraction and retraction, initially very limited scap mobility noted but this improved by end of session   11/09/23: Therapeutic Exercises NuStep Level 5, UE 8/LE 7, x 5 mins for warm up Free Motion Machine for thoracic rotation with core engaged for core stability 7# x 10 each side, returning therapist demo Squat in front of chair for glut tap with 3# weights x 10 and VC's for complete end motion with glut and core activation, pt reports feeling low back muscles working, not hurting Seated in chair and thoracic ext over back of chair stretch x 5 reps Supine over full foam roll horizontally first for thoracic ext x 5 reps, pt reports good stretch but felt better stretch in chair Therapeutic Activities Supine over full foam roll now vertically for bil UE scaption in a V x 10; then dead bug x 10 with VC's for TA activation throughout Supine on mat: Posterior pelvic tilt x 3 with excellent technique, then ppt with following: alt march x 10 each, then alt LE ext x 10 each;  Reverse curl x 10, then with obliques x 5 each side returning demo Quadruped for cat/cow x 5 [employer] with and without thread the needle x 5 reps Sidelying Open Book Thoracic Lumbar Rotation and Extension x 5 each Manual Therapy STM in prone with cocoa butter to bil thoracic paraspinals, also dynamic cupping to same intermittently during STM x 12 mins   11/03/23:Examination completed, findings reviewed, pt educated on POC, HEP, manual at bil paraspinals with addaday pt  in prone L1 and STM at Lt paraspinals which were most restricted . Pt motivated to participate in PT and agreeable to attempt recommendations.                                                                                                                             PATIENT EDUCATION:  Education details: Pensions consultant Person educated: Patient Education method: Solicitor, Actor cues, Verbal cues, and Handouts Education comprehension: verbalized understanding, returned demonstration, verbal cues required, tactile cues required, and needs further education  HOME EXERCISE PROGRAM: Access Code: [email or web address] URL: [email or web address] Date: 11/03/2023 Prepared by: Darryle   Exercises - Cat Cow  - 1 x daily - 7 x weekly - 1 sets - 10 reps - Tail Wag  - 1 x daily - 7 x weekly - 1 sets - 10 reps - Child's Pose with Thread the Needle  - 1 x daily - 7 x weekly - 1 sets - 5 reps - 15s holds - Quadruped Full Range Thoracic Rotation with Reach  - 1 x daily - 7 x weekly - 1 sets - 10 reps - Sidelying Open Book Thoracic Lumbar Rotation and Extension  -  1 x daily - 7 x weekly - 1 sets - 10 reps  11/12/23: - Supine Shoulder Horizontal Abduction with Resistance  - 2 x daily - 4-5 x weekly - 1-2 sets - 10 reps - 3 sec hold - Supine Shoulder External Rotation with Resistance  - 2 x daily - 4-5 x weekly - 1-2 sets - 10 reps - 3 sec hold - Supine narrow and wide grip flexion  - 4 x daily - 4-5 x weekly - 1-2 sets - 10 reps - 3 sec hold - Supine PNF D2   - 1 x daily - 4-5 x weekly - 1-2 sets - 10 reps - 3 sec hold - Forearm Walks on Wall with Resistance Band  - 1 x daily - 7 x weekly - 1-2 sets - 10 reps - Reverse Crunch  - 1 x daily - 7 x weekly - 2 sets - 10 reps   ASSESSMENT:  CLINICAL IMPRESSION: Scottie reported no significant changes upon arriving today.  We added prone wing taps and reach and pull along with thoracic mobility.  Dry needling performed on rhomboids bilaterally  today.  Patient had several severe twitch responses on both sides.  She reported no soreness but relief of muscle tension and pain after treatment.  She would benefit from continuing skilled PT to progress toward goals below.     OBJECTIVE IMPAIRMENTS: decreased activity tolerance, decreased coordination, decreased endurance, decreased mobility, decreased strength, increased fascial restrictions, increased muscle spasms, impaired flexibility, improper body mechanics, postural dysfunction, and pain.   ACTIVITY LIMITATIONS: carrying, lifting, sitting, standing, and squatting  PARTICIPATION LIMITATIONS: meal prep, community activity, and occupation  PERSONAL FACTORS: 1 comorbidity: history of back pain are also affecting patient's functional outcome.   REHAB POTENTIAL: Good  CLINICAL DECISION MAKING: Stable/uncomplicated  EVALUATION COMPLEXITY: Low   GOALS: Goals reviewed with patient? Yes  SHORT TERM GOALS: Target date: 12/01/23  Pt to be I with HEP for carry over and continuing recommendations for improved outcomes.   Baseline: Goal status: MET 11/24/23  2.  Pt to report no more than 2/10 back pain for improved tolerance to sitting for work at least 2 hours.  Baseline:  Goal status: In progress  3.  Pt to demonstrate full ROM at thoracic spine for improved posture at midline and upright tolerance.  Baseline:  Goal status: In Progress   LONG TERM GOALS: Target date: 02/02/24  Pt to be I with advanced HEP for carry over and continuing recommendations for improved outcomes.   Baseline:  Goal status: INITIAL  2.  Pt to score no higher than 5/50 owestry indicating improvement in functional mobility with less to no pain.  Baseline:  Goal status: INITIAL  3.  Pt to report 0/10 back pain for improved tolerance to sitting for work at least 2 hours.   Baseline:  Goal status: INITIAL  4.  Pt to report ability to walk at least 1 hour without increased pain for improved ability to  have prior level of activity and increase fitness goals. Baseline:  Goal status: INITIAL   PLAN:  PT FREQUENCY: 2x/week  PT DURATION: 6 weeks  PLANNED INTERVENTIONS: 97110-Therapeutic exercises, 97530- Therapeutic activity, [id number]- Neuromuscular re-education, 97535- Self Care, [id number]- Manual therapy, [phone]- Canalith repositioning, [id number]- Aquatic Therapy, [phone]- Electrical stimulation (manual), [id number]- Vasopneumatic device, [id number]- Ultrasound, [id number]- Traction (mechanical), [id number]- Ionotophoresis 4mg /ml Dexamethasone, [id number] (1-2 muscles), [id number] (3+ muscles)- Dry Needling, Patient/Family education, Balance training, Stair training, Taping, Joint mobilization, Joint manipulation, Spinal manipulation, Spinal  mobilization, Scar mobilization, DME instructions, Cryotherapy, Moist heat, and Biofeedback.  PLAN FOR NEXT SESSION: Assess response to DN #1, Cont core and posture strengthening and review/progress HEP prn, mobility mid back, cont manual therapy prn  Ronasia Isola B. Kenneith Stief, PT 11/24/23 8:39 PM [facility] [address], [address] [place], [place] [address] Phone # [phone] Fax [phone]

## 2023-11-25 ENCOUNTER — Encounter: Payer: Self-pay | Admitting: Neurology

## 2023-11-25 ENCOUNTER — Ambulatory Visit (INDEPENDENT_AMBULATORY_CARE_PROVIDER_SITE_OTHER): Admitting: Neurology

## 2023-11-25 VITALS — BP 128/76 | HR 83 | Ht 66.0 in | Wt 127.8 lb

## 2023-11-25 DIAGNOSIS — R55 Syncope and collapse: Secondary | ICD-10-CM | POA: Diagnosis not present

## 2023-11-25 NOTE — Progress Notes (Signed)
 Provider:  Dedra Gores, MD  Primary Care Physician:  Antonio Cyndee [name], DO [address] [place] [address]     Referring Provider: Bernie [name] PARAS, Md [address] [place],  [place] [address]          Chief Complaint according to patient   Patient presents with:          ESS- 3 FSS- 36   Patient is here with husband for syncope - fainting is happening every 6 months now instead of yearly. It is also taking longer to recover from the fainting. Took her 30 minutes to get back to normal has slurred speech and brain fog - showing signs of stroke       HISTORY OF PRESENT ILLNESS:  [name] [name] is a 48 y.o. female patient who is here for revisit 11/25/2023 for sleep issues. She underwent  sleep studies in 2023 , was seen one in a visit consultation in 08-20-2021. At that time she woke up undesirably early and felt not refreshed.  She would wake up at 4 AM with nocturia and then find her self unable to go back to sleep.   She has meanwhile worked on hormones and menopausal  therapies,  she followed [facility] and  worked on gastrointestinal issued,  switched to a integrative health profession, Dr Adin,  who dx  her with Hashimoto's,  she tested Mrs Enyeart  for food allergies, who was dx with leaky gut' . The strongest reactions to food were seen with :  bananas, peanuts,  black pepper, [place] and duck meat, crab meat, asparagus, squash, Eggs, venison,  trout,  barley- spinach. Hemp oil. Pineapple, pine nuts,  para-nuts.   She is no longer sleepy, but more fatigued- non refreshed by sleep.  She now sleeps 6-8 hours.  and not the feeling as if she couldn't go through the day.   She now has fainted about 3 times in the last 18 months and these lead to just seconds of unconsciousness.  She has a warning symptom, she tries to sit down,  One spell was different this summer: She sat in a lawn chair and felt thirsty, fainted while in line at a  concession stand.  Was white as a sheet, she had briefly slurred speech, and she seemed disoriented. It was a very hot day,  was possibly dehydrated.   No seizure , no stroke .   Cardiology work up with zio patch and echo was normal, clinically irrelavant carotid US  ,  over July and August 2025.     First Visit:    [name] [name] is a 48 y.o. female patient who was seen in Consultation on 08/20/21. Chief concern:   I often cannot go back to sleep after restroom break, usually after 4 hours of sleep and: Feeling too cold, or too hot at times at night, but this improved on HRT- and I haven't had a period since I started.    "I used to be able to sleep for 9 hours and longer before parenthood", deep sleep ended with birth of her eldest child, now 66. LASHAN MACIAS has a past medical history of Allergies, Basal cell carcinoma, Celiac disease (2009), Functional dyspepsia (09/05/2012), Gastritis, GERD (gastroesophageal reflux disease) (08/08/2012), Central line catheter jugularis, Guillain Barr syndrome (04/01/2007), Hyperthyroidism (04/01/2007), and heavy menstrual bleeding, Hypoglycemia, unspecified (10/26/2007).  Sleep relevant medical history: Nocturia once, no  recent hot flushes. Has had problems with allergic congestion of nose, mouth breather. insomnia, non-restorative sleep, waking in the night, and snoring. Onset of pre- menopause with decline in quality of sleep. Sleeps good and deep for the first 3-4 hours and usually gets up to use the restroom , then finds herself unable to sleep through the rest of the night. She was taking 10mg  of melatonin nightly but  cut back on caffeine and melatonin. Drowsiness reported mainly after lunch. Trazodone  made her sleepy but didn't help her go back to sleep. Currently on CBD which has worked better then Trazodone   ADDITIONAL INFORMATION:  The Epworth Sleepiness Scale endorsed at at  6 /24 points (scores above or equal to 10 are suggestive of  hypersomnolence). FSS endorsed at  31 /63 points.  Height: 66 in Weight: 139 lb (BMI 22) Neck Size: 13 in     Fam Hx : see previous note  Social HX; see previous note       Review of Systems: Out of a complete 14 system review, the patient complains of only the following symptoms, and all other reviewed systems are negative.:   Fainting easily     Social History   Socioeconomic History   Marital status: Married    Spouse name: [name]   Number of children: 2   Years of education: 16   Highest education level: Bachelor's degree (e.g., BA, AB, BS)  Occupational History   Occupation: Social worker: [employer]  Tobacco Use   Smoking status: Former    Current packs/day: 0.00    Types: Cigarettes    Quit date: 01/19/1998    Years since quitting: 25.8   Smokeless tobacco: Never   Tobacco comments:    About age 47   Vaping Use   Vaping status: Never Used  Substance and Sexual Activity   Alcohol use: Yes    Alcohol/week: 4.0 - 8.0 standard drinks of alcohol    Types: 4 - 8 Glasses of wine per week   Drug use: Yes    Comment: CBD   Sexual activity: Yes    Partners: Male  Other Topics Concern   Not on file  Social History Narrative   Married with 2 children   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 [place]   Occasional alcohol no drugs or tobacco use at this time   Exercise-- 2 x a week    R handed   Caffeine: a cup of tea and Coffee a day   Social Drivers of Corporate investment banker Strain: Low Risk  (05/17/2023)   Overall Financial Resource Strain (CARDIA)    Difficulty of Paying Living Expenses: Not hard at all  Food Insecurity: No Food Insecurity (05/17/2023)   Hunger Vital Sign    Worried About Running Out of Food in the Last Year: Never true    Ran Out of Food in the Last Year: Never true  Transportation Needs: No Transportation Needs (05/17/2023)   PRAPARE - Administrator, Civil Service (Medical): No     Lack of Transportation (Non-Medical): No  Physical Activity: Sufficiently Active (05/17/2023)   Exercise Vital Sign    Days of Exercise per Week: 5 days    Minutes of Exercise per Session: 40 min  Stress: Stress Concern Present (05/17/2023)   [employer] of Occupational Health - Occupational Stress Questionnaire    Feeling of Stress : Very much  Social Connections: Moderately Isolated (05/17/2023)  Social Connection and Isolation Panel    Frequency of Communication with Friends and Family: More than three times a week    Frequency of Social Gatherings with Friends and Family: Once a week    Attends Religious Services: Never    Database administrator or Organizations: No    Attends Engineer, structural: Not on file    Marital Status: Married    Family History  Problem Relation Age of Onset   Leukemia Mother 27   Hypertension Mother    Stroke Mother    Memory loss Mother    Hypertension Father    Hyperlipidemia Father    Diabetes Father    Prostate cancer Father 83   Depression Father    Heart attack Father    Hypertension Sister    Bipolar disorder Sister    Stroke Maternal Grandmother    Multiple sclerosis Cousin    Lupus Cousin    Hypertension Other        siblings   Autoimmune disease Other        numerous family members on her mother's side   Colon cancer Neg Hx    Esophageal cancer Neg Hx    Stomach cancer Neg Hx    Rectal cancer Neg Hx    Colon polyps Neg Hx     Past Medical History:  Diagnosis Date   Allergy    Basal cell carcinoma    abdomen   Celiac disease 2009   Functional dyspepsia 09/05/2012   Gastritis    GERD (gastroesophageal reflux disease) 08/08/2012   Guillain Barr syndrome 04/01/2007   After influenza vaccine   Hyperthyroidism 04/01/2007   Hypoglycemia, unspecified 10/26/2007    Past Surgical History:  Procedure Laterality Date   CESAREAN SECTION     COLONOSCOPY  05/12/2007   [facility]   ESOPHAGOGASTRODUODENOSCOPY      SKIN CANCER EXCISION     abdomin   WISDOM TOOTH EXTRACTION       Current Outpatient Medications on File Prior to Visit  Medication Sig Dispense Refill   cyclobenzaprine  (FLEXERIL ) 10 MG tablet Take 1 tablet (10 mg total) by mouth 3 (three) times daily as needed for muscle spasms. 30 tablet 0   DIGESTIVE ENZYMES      FA-Cyanocobalamin -B6-D-Ca-Aloe (RX SUPPORT HB/REFLUX/ALOE PO) Take by mouth.     Ferrous Sulfate (IRON) 325 (65 FE) MG TABS Take 65 mg by mouth daily.     glucosamine-chondroitin 500-400 MG tablet Take 1 tablet by mouth 3 (three) times daily.     Loratadine (CLARITIN) 10 MG CAPS      losartan  (COZAAR ) 50 MG tablet Take 1 tablet (50 mg total) by mouth daily. 90 tablet 1   Misc Natural Products (CORTISOL MANAGER PO) Take by mouth.     NON FORMULARY Take 1 tablet by mouth at bedtime. CBD (Patient taking differently: Take 1 tablet by mouth at bedtime. CBD - as needed)     NON FORMULARY      Probiotic Product (UP4 PROBIOTICS PO)      PROGESTERONE  MICRONIZED, PROGESTINS,      No current facility-administered medications on file prior to visit.    Allergies  Allergen Reactions   Banana Diarrhea   Gluten Meal Other (See Comments)   Influenza Vac Split Quad     REACTION: pt had Guillane Barre--- can not have flu shot   Peanut (Diagnostic) Diarrhea   Tilactase      DIAGNOSTIC DATA (LABS, IMAGING, TESTING) - I reviewed  patient records, labs, notes, testing and imaging myself where available.  Lab Results  Component Value Date   WBC 4.3 05/18/2023   HGB 14.4 05/18/2023   HCT 42.3 05/18/2023   MCV 90.7 05/18/2023   PLT 304.0 05/18/2023      Component Value Date/Time   NA 141 05/18/2023 1039   K 4.0 05/18/2023 1039   CL 103 05/18/2023 1039   CO2 29 05/18/2023 1039   GLUCOSE 85 05/18/2023 1039   BUN 10 05/18/2023 1039   CREATININE 0.76 05/18/2023 1039   CALCIUM 9.9 05/18/2023 1039   PROT 7.4 05/18/2023 1039   ALBUMIN 4.9 05/18/2023 1039   AST 21 05/18/2023 1039    ALT 25 05/18/2023 1039   ALKPHOS 41 05/18/2023 1039   BILITOT 0.7 05/18/2023 1039   GFRNONAA 102.49 05/22/2009 0902   GFRAA 126 04/25/2008 0932   Lab Results  Component Value Date   CHOL 191 05/18/2023   HDL 85.40 05/18/2023   LDLCALC 93 05/18/2023   TRIG 63.0 05/18/2023   CHOLHDL 2 05/18/2023   No results found for: HGBA1C Lab Results  Component Value Date   VITAMINB12 352 12/24/2022   Lab Results  Component Value Date   TSH 1.40 09/29/2023    PHYSICAL EXAM:  Vitals:   11/25/23 1413  BP: 128/76  Pulse: 83  SpO2: 98%   No data found. Body mass index is 20.63 kg/m.   Wt Readings from Last 3 Encounters:  11/25/23 127 lb 12.8 oz (58 kg)  09/29/23 124 lb (56.2 kg)  08/24/23 124 lb (56.2 kg)     Ht Readings from Last 3 Encounters:  11/25/23 5' 6 (1.676 m)  09/29/23 5' 6 (1.676 m)  08/24/23 5' 6 (1.676 m)      General: The patient is awake, alert and appears not in acute distress and groomed. Head: Normocephalic, atraumatic.  Neck is supple.Cardiovascular:  Regular rate and cardiac rhythm by pulse, without distended neck veins. Respiratory: no shortness of breath  Skin:  Without evidence of ankle edema, or rash.    NEUROLOGIC EXAM: The patient is awake and alert, oriented to place and time.   Memory subjective described as intact.  Attention span & concentration ability appears normal.   Speech is fluent,  without  dysarthria, dysphonia or aphasia.  Mood and affect are appropriate.   Neurological Examination: Mental Status: Intact. Language and speech are normal. No cognitive deficits. Cranial Nerves II-XII: Intact. PERL. EOMI. VFF. No nystagmus.  No facial droop.  No ptosis.  Hearing is grossly intact bilaterally.  The tongue is normal and midline. Motor: Strengths are 5/5 throughout. Muscle bulk and tone are normal. No tremors.  Coordination: No ataxia or dysmetria.  Sensory: Grossly intact throughout to all modalities. Reflexes: Normal and  symmetric throughout. No ankle clonus. Babinski's sign is absent bilaterally. Hoffman's sign is absent bilaterally. Gait and Station: Normal. Romberg's sign is absent.   ASSESSMENT AND PLAN :   48 y.o. year old female  here without neurological deficits : SYNCOPE    1) I have no explantation for the syncopal episodes, but strongly suspect these to be orthostatic or  vasovagal.   2) I like for her to try compression stockings and scheduled hydration.   3) keep hydrating well, observe your heart rate and rhythm on your smart watch.     No follow-ups on file. PRN      I would like to thank Antonio Meth, Jamee SAUNDERS, DO and Krasowski, Robert J, Md [phone]  [address] [place],  [place] [address] for allowing me to meet with this pleasant patient.      The patient will be seen in follow-up in the sleep clinic at [facility] for discussion of test results, sleep related symptoms and treatment compliance review, further management strategies, etc.   The referring provider will be notified of the test results.   The patient's condition requires frequent monitoring and adjustments in the treatment plan, reflecting the ongoing complexity of care.  This provider is the continuing focal point for all needed services for this condition.  After spending a total time of  45  minutes face to face and time for  history taking, physical and neurologic examination, review of laboratory studies,  personal review of imaging studies, reports and results of other testing and review of referral information / records as far as provided in visit,   Electronically signed by: Dedra Gores, MD 11/25/2023 2:35 PM  [facility] Board certified by The [employer] of Sleep Medicine and Diplomate of the [employer] of Sleep Medicine. Board certified In Neurology through the ABPN, Fellow of the [employer] of Neurology.

## 2023-11-25 NOTE — Patient Instructions (Signed)
 Syncope, Adult  Syncope is when you pass out or faint for a short time. It is caused by a sudden decrease in blood flow to the brain. This can happen for many reasons. It can sometimes happen when seeing blood, getting a shot (injection), or having pain or strong emotions. Most causes of fainting are not dangerous, but in some cases it can be a sign of a serious medical problem. If you faint, get help right away. Call your local emergency services (911 in the [place] Follow these instructions at home: Watch for any changes in your symptoms. Take these actions to stay safe and help with your symptoms: Knowing when you may be about to faint Signs that you may be about to faint include: Feeling dizzy or light-headed. It may feel like the room is spinning. Feeling weak. Feeling like you may vomit (nauseous). Seeing spots or seeing all white or all black. Having cold, clammy skin. Feeling warm and sweaty. Hearing ringing in the ears. If you start to feel like you might faint, sit or lie down right away. If sitting, lower your head down between your legs. If lying down, raise (elevate) your feet above the level of your heart. Breathe deeply and steadily. Wait until all of the symptoms are gone. Have someone stay with you until you feel better. Medicines Take over-the-counter and prescription medicines only as told by your doctor. If you are taking blood pressure or heart medicine, sit up and stand up slowly. Spend a few minutes getting ready to sit and then stand. This can help you feel less dizzy. Lifestyle Do not drive, use machinery, or play sports until your doctor says it is okay. Do not drink alcohol. Do not smoke or use any products that contain nicotine or tobacco. If you need help quitting, ask your doctor. Avoid hot tubs and saunas. General instructions Talk with your doctor about your symptoms. You may need to have testing to help find the cause. Drink enough fluid to keep your pee  (urine) pale yellow. Avoid standing for a long time. If you must stand for a long time, do movements such as: Moving your legs. Crossing your legs. Flexing and stretching your leg muscles. Squatting. Keep all follow-up visits. Contact a doctor if: You have episodes of near fainting. Get help right away if: You pass out or faint. You hit your head or are injured after fainting. You have any of these symptoms: Fast or uneven heartbeats (palpitations). Pain in your chest, belly, or back. Shortness of breath. You have jerky movements that you cannot control (seizure). You have a very bad headache. You are confused. You have problems with how you see (vision). You are very weak. You have trouble walking. You are bleeding from your mouth or your butt (rectum). You have black or tarry poop (stool). These symptoms may be an emergency. Get help right away. Call your local emergency services (911 in the [place] Do not wait to see if the symptoms will go away. Do not drive yourself to the hospital. Summary Syncope is when you pass out or faint for a short time. It is caused by a sudden decrease in blood flow to the brain. Signs that you may be about to faint include feeling dizzy or light-headed, feeling like you may vomit, seeing all white or all black, or having cold, clammy skin. If you start to feel like you might faint, sit or lie down right away. Lower your head if sitting, or raise (elevate)  your feet if lying down. Breathe deeply and steadily. Wait until all of the symptoms are gone. This information is not intended to replace advice given to you by your health care provider. Make sure you discuss any questions you have with your health care provider. Document Revised: 06/13/2020 Document Reviewed: 06/13/2020 Elsevier Patient Education  2024 Else[employer] Inc.  Near-Syncope Near-syncope is when you suddenly feel like you might pass out or faint. This may also be called presyncope. During  an episode of near-syncope, you may: Feel dizzy, weak, or light-headed. It may feel like the room is spinning. Feel like you may vomit (nauseous). See spots or see all white or all black. Have cold, clammy skin. Feel warm and sweaty. Hear ringing in your ears. This condition is caused by a sudden decrease in blood flow to the brain. This can result from many causes, but most of those causes are not dangerous. However, near-syncope may be a sign of a serious medical problem, so it is important to seek medical care. Follow these instructions at home: Medicines Take over-the-counter and prescription medicines only as told by your doctor. If you are taking blood pressure or heart medicine, get up slowly and spend many minutes getting ready to sit and then stand. This can help with dizziness. Lifestyle Do not drive, use machinery, or play sports until your doctor says it is okay. Do not drink alcohol. Do not smoke or use any products that contain nicotine or tobacco. If you need help quitting, ask your doctor. Avoid hot tubs and saunas. General instructions Be aware of any changes in your symptoms. Talk with your doctor about your symptoms. You may need to have testing to help find the cause. If you start to feel like you might pass out, sit or lie down right away. If sitting, lower your head down between your legs. If lying down, raise (elevate) your feet above the level of your heart. Breathe deeply and steadily. Wait until all of the symptoms are gone. Have someone stay with you until you feel better. Drink enough fluid to keep your pee (urine) pale yellow. Avoid standing for a long time. If you must stand for a long time, do movements such as: Moving your legs. Crossing your legs. Flexing and stretching your leg muscles. Squatting. Keep all follow-up visits. Contact a doctor if: You continue to have episodes of near fainting. Get help right away if: You pass out or faint. You have  any of these symptoms: Fast or uneven heartbeats (palpitations). Pain in your chest, belly, or back. Shortness of breath. You have a seizure. You have a very bad headache. You are confused. You have trouble seeing. You are very weak. You have trouble walking. You are bleeding from your mouth or butt. You have black or tarry poop (stool). These symptoms may be an emergency. Get help right away. Call your local emergency services (911 in the [place] Do not wait to see if the symptoms will go away. Do not drive yourself to the hospital. Summary Near-syncope is when you suddenly feel like you might pass out or faint. This condition is caused by a sudden decrease in blood flow to the brain. Near-syncope may be a sign of a serious medical problem, so it is important to seek medical care. If you start to feel like you might pass out, sit or lie down right away. If sitting, lower your head down between your legs. If lying down, raise (elevate) your feet above the level  of your heart. Talk with your doctor about your symptoms. You may need to have testing to help find the cause. This information is not intended to replace advice given to you by your health care provider. Make sure you discuss any questions you have with your health care provider. Document Revised: 06/13/2020 Document Reviewed: 06/13/2020 Elsevier Patient Education  2024 [employer].

## 2023-11-26 ENCOUNTER — Encounter: Payer: Self-pay | Admitting: Physical Therapy

## 2023-11-26 ENCOUNTER — Ambulatory Visit: Admitting: Physical Therapy

## 2023-11-26 DIAGNOSIS — M5459 Other low back pain: Secondary | ICD-10-CM

## 2023-11-26 DIAGNOSIS — M6281 Muscle weakness (generalized): Secondary | ICD-10-CM | POA: Diagnosis not present

## 2023-11-26 DIAGNOSIS — M62838 Other muscle spasm: Secondary | ICD-10-CM | POA: Diagnosis not present

## 2023-11-26 DIAGNOSIS — R252 Cramp and spasm: Secondary | ICD-10-CM

## 2023-11-26 DIAGNOSIS — R293 Abnormal posture: Secondary | ICD-10-CM | POA: Diagnosis not present

## 2023-11-26 NOTE — Therapy (Signed)
 OUTPATIENT PHYSICAL THERAPY THORACOLUMBAR TREATMENT   Patient Name: [name] [name] MRN: [id number] DOB:1975/07/14, 49 y.o., female Today's Date: 11/26/2023  END OF SESSION:  PT End of Session - 11/26/23 1058     Visit Number 5    Date for Recertification  02/02/24    Authorization Type BCBS    PT Start Time 1058    PT Stop Time 1146    PT Time Calculation (min) 48 min    Activity Tolerance Patient tolerated treatment well    Behavior During Therapy [facility] for tasks assessed/performed           Past Medical History:  Diagnosis Date   Allergy    Basal cell carcinoma    abdomen   Celiac disease 2009   Functional dyspepsia 09/05/2012   Gastritis    GERD (gastroesophageal reflux disease) 08/08/2012   Guillain Barr syndrome 04/01/2007   After influenza vaccine   Hyperthyroidism 04/01/2007   Hypoglycemia, unspecified 10/26/2007   Past Surgical History:  Procedure Laterality Date   CESAREAN SECTION     COLONOSCOPY  05/12/2007   Gessner   ESOPHAGOGASTRODUODENOSCOPY     SKIN CANCER EXCISION     abdomin   WISDOM TOOTH EXTRACTION     Patient Active Problem List   Diagnosis Date Noted   Nonsustained ventricular tachycardia (HCC) 08/24/2023   Vasovagal syncope 07/09/2023   Hashimoto's disease 07/07/2023   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   Small intestinal bacterial overgrowth 09/13/2022   Primary hypertension 06/04/2022   Preventative health care 05/07/2022   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   Fatigue 08/20/2021   Functional dyspepsia 09/05/2012   GERD (gastroesophageal reflux disease) 08/08/2012   Acne 08/08/2012   Hypoglycemia, unspecified 10/26/2007   Celiac disease 10/26/2007   Hyperthyroidism 04/01/2007   Guillain Barr syndrome 04/01/2007    PCP: Antonio Cyndee [name], DO  REFERRING PROVIDER: Antonio Cyndee [name], DO  REFERRING DIAG:  M54.9 (ICD-10-CM) - Mid back pain  Rationale for Evaluation and Treatment: Rehabilitation  THERAPY DIAG:  Other low back pain  Muscle weakness (generalized)  Abnormal posture  Cramp and spasm  ONSET DATE: unsure specifically ~10/24/23    SUBJECTIVE:                                                                                                                                                                                           SUBJECTIVE STATEMENT: My back feels so much better. I still feel it a little on the R side.   PERTINENT HISTORY:  Guillain Barr syndrome,  Hyperthyroidism, Hashimoto's disease, CESAREAN SECTION, Celiac disease  PAIN:  Are you having pain? Yes 1-2 /10.   PRECAUTIONS: None  RED FLAGS: None   WEIGHT BEARING RESTRICTIONS: No  FALLS:  Has patient fallen in last 6 months? No  LIVING ENVIRONMENT: Lives with: lives with their family   OCCUPATION: desk job, works at home   PLOF: Independent  PATIENT GOALS: to have no pain with ending work day  NEXT MD VISIT: no   OBJECTIVE:  Note: Objective measures were completed at Evaluation unless otherwise noted.  DIAGNOSTIC FINDINGS:    PATIENT SURVEYS:  Oswestry Score: 11 / 50 or 22 %  COGNITION: Overall cognitive status: Within functional limits for tasks assessed     SENSATION: WFL  MUSCLE LENGTH: Bil hip flexors tight and adductors minimally   POSTURE: rounded shoulders and increased thoracic kyphosis  PALPATION: TTP and tightness noted in bil thoracic and lumbar paraspinals   LUMBAR ROM:   AROM eval  Flexion WFL  Extension Limited by 25%  Right lateral flexion WFL  Left lateral flexion WFL  Right rotation Limited by 25%  Left rotation Limited by 25%   (Blank rows = not tested)  LOWER EXTREMITY ROM:     WFL  LOWER EXTREMITY MMT:    Bil hips grossly 4+/5   FUNCTIONAL TESTS:  Sit up test 2/3  GAIT: WFL  TREATMENT DATE:  11/26/23: Nustep x 6 min level 5 Prone  wing taps 2 x 10 Prone reach and pull 2 x 10 Quadruped thread the needle with thoracic rotation x 10 each side Single arm rows 5# x 10 B, then 10# x 10 B Single arm lawn mower pull 5# x 10, then 10# x 10 Lat pull 35# 2x10  Hang from lat bar for stretch x 2  Side lying open book x 10 each side Standing serratus push blue band 2x10 Self MFR with double massage ball along spine   11/24/23: Nustep x 6 min level 4 Prone wing taps 2 x 10 Prone reach and pull 2 x 10 Quadruped thread the needle with thoracic rotation x 10 each side Side lying open book x 10 each side Trigger Point Dry Needling Initial Treatment: Pt instructed on Dry Needling rational, procedures, and possible side effects. Pt instructed to expect mild to moderate muscle soreness later in the day and/or into the next day.  Pt instructed in methods to reduce muscle soreness. Pt instructed to continue prescribed HEP. Because Dry Needling was performed over or adjacent to a lung field, pt was educated on S/S of pneumothorax and to seek immediate medical attention should they occur.  Patient was educated on signs and symptoms of infection and other risk factors and advised to seek medical attention should they occur.  Patient verbalized understanding of these instructions and education.  Patient Verbal Consent Given: Yes Education Handout Provided: Yes Muscles Treated: rhomboids Electrical Stimulation Performed: No Treatment Response/Outcome: Skilled palpation used to identify taut bands and trigger points.  Once identified, dry needling techniques used to treat these areas.  Twitch response ellicited along with palpable elongation of muscle.  Following treatment, patient reported significant decrease in muscle tension    11/12/23: Therapeutic Exercises NuStep Level 6, UE 8/LE 7, x 5 mins for warm up Stretches done at end of session: Seated in chair and leaning over back of chair for thoracic stretches x 10, 5 sec  holds Doorway pectoralis stretch x 5 reps, 20 sec holds Thoracic stretches holding door frame and pulling  away rounding upper back in varying positions Therapeutic Activities Supine over half foam roll for bil UE scaption in a V x 10, bil UE abd in a snow angel x 5 reps; supine scapular series with red theraband (horz abd, er, narrow and wide grip flex, and D2 each UE) x 10 each and working to incorporate chin tuck during for increased postural stability  Forearms on wall with red theraband for following: Wall walks x 5 up/down, and then scap retract x 10 each, then hands on wall for clock wall taps x 5 each returning therapist demo for each Manual Therapy STM in prone with cocoa butter to bil thoracic paraspinals, but focused on Rt scapular area over multiple trigger points at medial and lateral scap borders, also dynamic cupping to same intermittently during STM x 18 mins Scap Mobs on Rt into protraction and retraction, initially very limited scap mobility noted but this improved by end of session   11/09/23: Therapeutic Exercises NuStep Level 5, UE 8/LE 7, x 5 mins for warm up Free Motion Machine for thoracic rotation with core engaged for core stability 7# x 10 each side, returning therapist demo Squat in front of chair for glut tap with 3# weights x 10 and VC's for complete end motion with glut and core activation, pt reports feeling low back muscles working, not hurting Seated in chair and thoracic ext over back of chair stretch x 5 reps Supine over full foam roll horizontally first for thoracic ext x 5 reps, pt reports good stretch but felt better stretch in chair Therapeutic Activities Supine over full foam roll now vertically for bil UE scaption in a V x 10; then dead bug x 10 with VC's for TA activation throughout Supine on mat: Posterior pelvic tilt x 3 with excellent technique, then ppt with following: alt march x 10 each, then alt LE ext x 10 each;  Reverse curl x 10, then  with obliques x 5 each side returning demo Quadruped for cat/cow x 5 Google with and without thread the needle x 5 reps Sidelying Open Book Thoracic Lumbar Rotation and Extension x 5 each Manual Therapy STM in prone with cocoa butter to bil thoracic paraspinals, also dynamic cupping to same intermittently during STM x 12 mins   11/03/23:Examination completed, findings reviewed, pt educated on POC, HEP, manual at bil paraspinals with addaday pt in prone L1 and STM at Lt paraspinals which were most restricted . Pt motivated to participate in PT and agreeable to attempt recommendations.                                                                                                                             PATIENT EDUCATION:  Education details: Pensions consultant Person educated: Patient Education method: Solicitor, Actor cues, Verbal cues, and Handouts Education comprehension: verbalized understanding, returned demonstration, verbal cues required, tactile cues required, and needs further education  HOME EXERCISE PROGRAM: Access Code: [email or web address] URL: [email or web address]  Date: 11/03/2023 Prepared by: Darryle   Exercises - Cat Cow  - 1 x daily - 7 x weekly - 1 sets - 10 reps - Tail Wag  - 1 x daily - 7 x weekly - 1 sets - 10 reps - Child's Pose with Thread the Needle  - 1 x daily - 7 x weekly - 1 sets - 5 reps - 15s holds - Quadruped Full Range Thoracic Rotation with Reach  - 1 x daily - 7 x weekly - 1 sets - 10 reps - Sidelying Open Book Thoracic Lumbar Rotation and Extension  - 1 x daily - 7 x weekly - 1 sets - 10 reps  11/12/23: - Supine Shoulder Horizontal Abduction with Resistance  - 2 x daily - 4-5 x weekly - 1-2 sets - 10 reps - 3 sec hold - Supine Shoulder External Rotation with Resistance  - 2 x daily - 4-5 x weekly - 1-2 sets - 10 reps - 3 sec hold - Supine narrow and wide grip flexion  - 4 x daily - 4-5 x weekly - 1-2 sets - 10 reps - 3 sec hold -  Supine PNF D2   - 1 x daily - 4-5 x weekly - 1-2 sets - 10 reps - 3 sec hold - Forearm Walks on Wall with Resistance Band  - 1 x daily - 7 x weekly - 1-2 sets - 10 reps - Reverse Crunch  - 1 x daily - 7 x weekly - 2 sets - 10 reps   ASSESSMENT:  CLINICAL IMPRESSION: Patient reports significant improvement in pain after DN. We added in heavier strengthening today to rhomboids and parascapular muscles today with no increase in pain reported. Ended session with self MFR using double massage ball which patient really liked.    OBJECTIVE IMPAIRMENTS: decreased activity tolerance, decreased coordination, decreased endurance, decreased mobility, decreased strength, increased fascial restrictions, increased muscle spasms, impaired flexibility, improper body mechanics, postural dysfunction, and pain.   ACTIVITY LIMITATIONS: carrying, lifting, sitting, standing, and squatting  PARTICIPATION LIMITATIONS: meal prep, community activity, and occupation  PERSONAL FACTORS: 1 comorbidity: history of back pain are also affecting patient's functional outcome.   REHAB POTENTIAL: Good  CLINICAL DECISION MAKING: Stable/uncomplicated  EVALUATION COMPLEXITY: Low   GOALS: Goals reviewed with patient? Yes  SHORT TERM GOALS: Target date: 12/01/23  Pt to be I with HEP for carry over and continuing recommendations for improved outcomes.   Baseline: Goal status: MET 11/24/23  2.  Pt to report no more than 2/10 back pain for improved tolerance to sitting for work at least 2 hours.  Baseline:  Goal status: In progress  3.  Pt to demonstrate full ROM at thoracic spine for improved posture at midline and upright tolerance.  Baseline:  Goal status: In Progress   LONG TERM GOALS: Target date: 02/02/24  Pt to be I with advanced HEP for carry over and continuing recommendations for improved outcomes.   Baseline:  Goal status: INITIAL  2.  Pt to score no higher than 5/50 owestry indicating improvement in  functional mobility with less to no pain.  Baseline:  Goal status: INITIAL  3.  Pt to report 0/10 back pain for improved tolerance to sitting for work at least 2 hours.   Baseline:  Goal status: INITIAL  4.  Pt to report ability to walk at least 1 hour without increased pain for improved ability to have prior level of activity and increase fitness goals. Baseline:  Goal status:  INITIAL   PLAN:  PT FREQUENCY: 2x/week  PT DURATION: 6 weeks  PLANNED INTERVENTIONS: 97110-Therapeutic exercises, 97530- Therapeutic activity, [id number]- Neuromuscular re-education, 97535- Self Care, [id number]- Manual therapy, [phone]- Canalith repositioning, [id number]- Aquatic Therapy, [id number]- Electrical stimulation (manual), [id number]- Vasopneumatic device, [id number]- Ultrasound, [id number]- Traction (mechanical), [id number]- Ionotophoresis 4mg /ml Dexamethasone, [id number] (1-2 muscles), [id number] (3+ muscles)- Dry Needling, Patient/Family education, Balance training, Stair training, Taping, Joint mobilization, Joint manipulation, Spinal manipulation, Spinal mobilization, Scar mobilization, DME instructions, Cryotherapy, Moist heat, and Biofeedback.  PLAN FOR NEXT SESSION: Assess response to DN #1 and increased strengthening, Cont core and posture strengthening and review/progress HEP prn, mobility mid back, cont manual therapy prn  Mliss Cummins, PT  11/26/23 11:56 AM [facility] [address] [place], [place] [address] Phone # [phone] Fax [phone]

## 2023-12-01 ENCOUNTER — Ambulatory Visit: Admitting: Physical Therapy

## 2023-12-01 ENCOUNTER — Encounter: Payer: Self-pay | Admitting: Physical Therapy

## 2023-12-01 DIAGNOSIS — R293 Abnormal posture: Secondary | ICD-10-CM

## 2023-12-01 DIAGNOSIS — M6281 Muscle weakness (generalized): Secondary | ICD-10-CM | POA: Diagnosis not present

## 2023-12-01 DIAGNOSIS — M5459 Other low back pain: Secondary | ICD-10-CM | POA: Diagnosis not present

## 2023-12-01 DIAGNOSIS — M62838 Other muscle spasm: Secondary | ICD-10-CM | POA: Diagnosis not present

## 2023-12-01 DIAGNOSIS — R252 Cramp and spasm: Secondary | ICD-10-CM

## 2023-12-01 NOTE — Therapy (Signed)
 OUTPATIENT PHYSICAL THERAPY THORACOLUMBAR TREATMENT   Patient Name: [name] [name] MRN: [id number] DOB:1975-09-10, 48 y.o., female Today's Date: 12/01/2023  END OF SESSION:  PT End of Session - 12/01/23 1348     Visit Number 6    Date for Recertification  02/02/24    Authorization Type BCBS    PT Start Time 1233    PT Stop Time 1316    PT Time Calculation (min) 43 min    Activity Tolerance Patient tolerated treatment well    Behavior During Therapy [facility] for tasks assessed/performed            Past Medical History:  Diagnosis Date   Allergy    Basal cell carcinoma    abdomen   Celiac disease 2009   Functional dyspepsia 09/05/2012   Gastritis    GERD (gastroesophageal reflux disease) 08/08/2012   Guillain Barr syndrome 04/01/2007   After influenza vaccine   Hyperthyroidism 04/01/2007   Hypoglycemia, unspecified 10/26/2007   Past Surgical History:  Procedure Laterality Date   CESAREAN SECTION     COLONOSCOPY  05/12/2007   Gessner   ESOPHAGOGASTRODUODENOSCOPY     SKIN CANCER EXCISION     abdomin   WISDOM TOOTH EXTRACTION     Patient Active Problem List   Diagnosis Date Noted   Nonsustained ventricular tachycardia (HCC) 08/24/2023   Vasovagal syncope 07/09/2023   Hashimoto's disease 07/07/2023   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   Small intestinal bacterial overgrowth 09/13/2022   Primary hypertension 06/04/2022   Preventative health care 05/07/2022   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   Fatigue 08/20/2021   Functional dyspepsia 09/05/2012   GERD (gastroesophageal reflux disease) 08/08/2012   Acne 08/08/2012   Hypoglycemia, unspecified 10/26/2007   Celiac disease 10/26/2007   Hyperthyroidism 04/01/2007   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    PCP: Antonio Cyndee [name], DO  REFERRING PROVIDER: Antonio Cyndee [name], DO  REFERRING DIAG:  M54.9 (ICD-10-CM) - Mid back pain  Rationale for Evaluation and Treatment: Rehabilitation  THERAPY DIAG:  Other low back pain  Muscle weakness (generalized)  Abnormal posture  Cramp and spasm  ONSET DATE: unsure specifically ~10/24/23    SUBJECTIVE:                                                                                                                                                                                           SUBJECTIVE STATEMENT: Patient reports she feels muscle knots bilaterally but worse on the Rt side. She is not not sure if it is because we  are working muscles that need to be worked.  PERTINENT HISTORY:  Guillain Barr syndrome, Hyperthyroidism, Hashimoto's disease, CESAREAN SECTION, Celiac disease  PAIN:  Are you having pain? Yes 1-2 /10.   PRECAUTIONS: None  RED FLAGS: None   WEIGHT BEARING RESTRICTIONS: No  FALLS:  Has patient fallen in last 6 months? No  LIVING ENVIRONMENT: Lives with: lives with their family   OCCUPATION: desk job, works at home   PLOF: Independent  PATIENT GOALS: to have no pain with ending work day  NEXT MD VISIT: no   OBJECTIVE:  Note: Objective measures were completed at Evaluation unless otherwise noted.  DIAGNOSTIC FINDINGS:    PATIENT SURVEYS:  Oswestry Score: 11 / 50 or 22 %  COGNITION: Overall cognitive status: Within functional limits for tasks assessed     SENSATION: WFL  MUSCLE LENGTH: Bil hip flexors tight and adductors minimally   POSTURE: rounded shoulders and increased thoracic kyphosis  PALPATION: TTP and tightness noted in bil thoracic and lumbar paraspinals   LUMBAR ROM:   AROM eval  Flexion WFL  Extension Limited by 25%  Right lateral flexion WFL  Left lateral flexion WFL  Right rotation Limited by 25%  Left rotation Limited by 25%   (Blank rows = not tested)  LOWER EXTREMITY ROM:     WFL  LOWER EXTREMITY MMT:    Bil hips grossly 4+/5   FUNCTIONAL TESTS:  Sit  up test 2/3  GAIT: WFL  TREATMENT DATE:  12/01/23: UBE Level 2.5 3/3- PT present to discuss status Single arm row at cable column 10# 2 x 10 bilateral  Lat pull 35# 2x10  Hang from lat bar for stretch x 2  Cat cow with long red band x 10 Discussion of patient's pain location and possible contributing factors Lat stretch at counter Trigger Point Dry Needling  Subsequent Treatment: Instructions reviewed, if requested by the patient, prior to subsequent dry needling treatment.   Patient Verbal Consent Given: Yes Education Handout Provided: Previously Provided Muscles Treated: bilateral lats and rhomboids  Electrical Stimulation Performed: No Treatment Response/Outcome: Utilized skilled palpation to identify trigger points.  During dry needling able to palpate muscle twitch and muscle elongation  Soft tissue mobilization performed to further promote tissue elongation and decreased pain.         11/26/23: Nustep x 6 min level 5 Prone wing taps 2 x 10 Prone reach and pull 2 x 10 Quadruped thread the needle with thoracic rotation x 10 each side Single arm rows 5# x 10 B, then 10# x 10 B Single arm lawn mower pull 5# x 10, then 10# x 10 Lat pull 35# 2x10  Hang from lat bar for stretch x 2  Side lying open book x 10 each side Standing serratus push blue band 2x10 Self MFR with double massage ball along spine   11/24/23: Nustep x 6 min level 4 Prone wing taps 2 x 10 Prone reach and pull 2 x 10 Quadruped thread the needle with thoracic rotation x 10 each side Side lying open book x 10 each side Trigger Point Dry Needling Initial Treatment: Pt instructed on Dry Needling rational, procedures, and possible side effects. Pt instructed to expect mild to moderate muscle soreness later in the day and/or into the next day.  Pt instructed in methods to reduce muscle soreness. Pt instructed to continue prescribed HEP. Because Dry Needling was performed over or adjacent to a lung  field, pt was educated on S/S of pneumothorax and  to seek immediate medical attention should they occur.  Patient was educated on signs and symptoms of infection and other risk factors and advised to seek medical attention should they occur.  Patient verbalized understanding of these instructions and education.  Patient Verbal Consent Given: Yes Education Handout Provided: Yes Muscles Treated: rhomboids Electrical Stimulation Performed: No Treatment Response/Outcome: Skilled palpation used to identify taut bands and trigger points.  Once identified, dry needling techniques used to treat these areas.  Twitch response ellicited along with palpable elongation of muscle.  Following treatment, patient reported significant decrease in muscle tension      PATIENT EDUCATION:  Education details: [email or web address] Person educated: Patient Education method: Explanation, Demonstration, Tactile cues, Verbal cues, and Handouts Education comprehension: verbalized understanding, returned demonstration, verbal cues required, tactile cues required, and needs further education  HOME EXERCISE PROGRAM: Access Code: [email or web address] URL: [email or web address] Date: 11/03/2023 Prepared by: Darryle   Exercises - Cat Cow  - 1 x daily - 7 x weekly - 1 sets - 10 reps - Tail Wag  - 1 x daily - 7 x weekly - 1 sets - 10 reps - Child's Pose with Thread the Needle  - 1 x daily - 7 x weekly - 1 sets - 5 reps - 15s holds - Quadruped Full Range Thoracic Rotation with Reach  - 1 x daily - 7 x weekly - 1 sets - 10 reps - Sidelying Open Book Thoracic Lumbar Rotation and Extension  - 1 x daily - 7 x weekly - 1 sets - 10 reps  11/12/23: - Supine Shoulder Horizontal Abduction with Resistance  - 2 x daily - 4-5 x weekly - 1-2 sets - 10 reps - 3 sec hold - Supine Shoulder External Rotation with Resistance  - 2 x daily - 4-5 x weekly - 1-2 sets - 10 reps - 3 sec hold - Supine narrow and wide grip flexion  - 4 x daily - 4-5 x weekly  - 1-2 sets - 10 reps - 3 sec hold - Supine PNF D2   - 1 x daily - 4-5 x weekly - 1-2 sets - 10 reps - 3 sec hold - Forearm Walks on Wall with Resistance Band  - 1 x daily - 7 x weekly - 1-2 sets - 10 reps - Reverse Crunch  - 1 x daily - 7 x weekly - 2 sets - 10 reps   ASSESSMENT:  CLINICAL IMPRESSION: Patient presents complaining of increased muscle knots below her scapula Rt > Lt . She had a good response to dry needling last time so incorporated that again. Noted muscle twitches Rt > Lt especially in lats. Educated patient on s/s of pneumothorax as a review. Patient verbalized understanding. Patient is highly compliant with HEP and wants to better manage pain. Patient will benefit from skilled PT to address the below impairments and improve overall function.   OBJECTIVE IMPAIRMENTS: decreased activity tolerance, decreased coordination, decreased endurance, decreased mobility, decreased strength, increased fascial restrictions, increased muscle spasms, impaired flexibility, improper body mechanics, postural dysfunction, and pain.   ACTIVITY LIMITATIONS: carrying, lifting, sitting, standing, and squatting  PARTICIPATION LIMITATIONS: meal prep, community activity, and occupation  PERSONAL FACTORS: 1 comorbidity: history of back pain are also affecting patient's functional outcome.   REHAB POTENTIAL: Good  CLINICAL DECISION MAKING: Stable/uncomplicated  EVALUATION COMPLEXITY: Low   GOALS: Goals reviewed with patient? Yes  SHORT TERM GOALS: Target date: 12/01/23  Pt to be I with HEP for carry over and continuing recommendations for  improved outcomes.   Baseline: Goal status: MET 11/24/23  2.  Pt to report no more than 2/10 back pain for improved tolerance to sitting for work at least 2 hours.  Baseline:  Goal status: In progress  3.  Pt to demonstrate full ROM at thoracic spine for improved posture at midline and upright tolerance.  Baseline:  Goal status: In Progress   LONG  TERM GOALS: Target date: 02/02/24  Pt to be I with advanced HEP for carry over and continuing recommendations for improved outcomes.   Baseline:  Goal status: INITIAL  2.  Pt to score no higher than 5/50 owestry indicating improvement in functional mobility with less to no pain.  Baseline:  Goal status: INITIAL  3.  Pt to report 0/10 back pain for improved tolerance to sitting for work at least 2 hours.   Baseline:  Goal status: INITIAL  4.  Pt to report ability to walk at least 1 hour without increased pain for improved ability to have prior level of activity and increase fitness goals. Baseline:  Goal status: INITIAL   PLAN:  PT FREQUENCY: 2x/week  PT DURATION: 6 weeks  PLANNED INTERVENTIONS: 97110-Therapeutic exercises, 97530- Therapeutic activity, [id number]- Neuromuscular re-education, 97535- Self Care, 02859- Manual therapy, [phone]- Canalith repositioning, [id number]- Aquatic Therapy, [phone]- Electrical stimulation (manual), [id number]- Vasopneumatic device, [id number]- Ultrasound, [id number]- Traction (mechanical), [id number]- Ionotophoresis 4mg /ml Dexamethasone, 79439 (1-2 muscles), 20561 (3+ muscles)- Dry Needling, Patient/Family education, Balance training, Stair training, Taping, Joint mobilization, Joint manipulation, Spinal manipulation, Spinal mobilization, Scar mobilization, DME instructions, Cryotherapy, Moist heat, and Biofeedback.  PLAN FOR NEXT SESSION: Assess response to DN #2 and increased strengthening, Cont core and posture strengthening and review/progress HEP prn, mobility mid back, cont manual therapy prn  Chrishelle Zito, PT 12/01/23 1:49 PM [facility] [address], [address] [place], [place] [address] Phone # [phone] Fax [phone]

## 2023-12-03 ENCOUNTER — Encounter: Payer: Self-pay | Admitting: Rehabilitative and Restorative Service Providers"

## 2023-12-03 ENCOUNTER — Ambulatory Visit: Admitting: Rehabilitative and Restorative Service Providers"

## 2023-12-03 DIAGNOSIS — M62838 Other muscle spasm: Secondary | ICD-10-CM | POA: Diagnosis not present

## 2023-12-03 DIAGNOSIS — M5459 Other low back pain: Secondary | ICD-10-CM

## 2023-12-03 DIAGNOSIS — R293 Abnormal posture: Secondary | ICD-10-CM | POA: Diagnosis not present

## 2023-12-03 DIAGNOSIS — R252 Cramp and spasm: Secondary | ICD-10-CM

## 2023-12-03 DIAGNOSIS — M6281 Muscle weakness (generalized): Secondary | ICD-10-CM | POA: Diagnosis not present

## 2023-12-03 NOTE — Therapy (Signed)
 OUTPATIENT PHYSICAL THERAPY THORACOLUMBAR TREATMENT   Patient Name: [name] [name] MRN: [id number] DOB:October 31, 1975, 48 y.o., female Today's Date: 12/03/2023  END OF SESSION:  PT End of Session - 12/03/23 1125     Visit Number 7    Date for Recertification  02/02/24    Authorization Type BCBS    PT Start Time 1123   Pt arrived late   PT Stop Time 1146    PT Time Calculation (min) 23 min    Activity Tolerance Patient tolerated treatment well    Behavior During Therapy [facility] for tasks assessed/performed            Past Medical History:  Diagnosis Date   Allergy    Basal cell carcinoma    abdomen   Celiac disease 2009   Functional dyspepsia 09/05/2012   Gastritis    GERD (gastroesophageal reflux disease) 08/08/2012   Guillain Barr syndrome 04/01/2007   After influenza vaccine   Hyperthyroidism 04/01/2007   Hypoglycemia, unspecified 10/26/2007   Past Surgical History:  Procedure Laterality Date   CESAREAN SECTION     COLONOSCOPY  05/12/2007   Gessner   ESOPHAGOGASTRODUODENOSCOPY     SKIN CANCER EXCISION     abdomin   WISDOM TOOTH EXTRACTION     Patient Active Problem List   Diagnosis Date Noted   Nonsustained ventricular tachycardia (HCC) 08/24/2023   Vasovagal syncope 07/09/2023   Hashimoto's disease 07/07/2023   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   Small intestinal bacterial overgrowth 09/13/2022   Primary hypertension 06/04/2022   Preventative health care 05/07/2022   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   Fatigue 08/20/2021   Functional dyspepsia 09/05/2012   GERD (gastroesophageal reflux disease) 08/08/2012   Acne 08/08/2012   Hypoglycemia, unspecified 10/26/2007   Celiac disease 10/26/2007   Hyperthyroidism 04/01/2007   Guillain Barr syndrome 04/01/2007    PCP: Antonio Cyndee [name], DO  REFERRING PROVIDER: Antonio Cyndee [name],  DO  REFERRING DIAG: M54.9 (ICD-10-CM) - Mid back pain  Rationale for Evaluation and Treatment: Rehabilitation  THERAPY DIAG:  Other low back pain  Muscle weakness (generalized)  Abnormal posture  Cramp and spasm  Other muscle spasm  ONSET DATE: unsure specifically ~10/24/23    SUBJECTIVE:                                                                                                                                                                                           SUBJECTIVE STATEMENT: Patient reports the dry needling seemed to help last time and she is only having  slight pain.  PERTINENT HISTORY:  Guillain Barr syndrome, Hyperthyroidism, Hashimoto's disease, CESAREAN SECTION, Celiac disease  PAIN:  Are you having pain? Yes NPRS scale: 1-2/10 Pain location: thoracic region Pain description: intermittent and aching  Aggravating factors: certain movements Relieving factors: stretching and rest   PRECAUTIONS: None  RED FLAGS: None   WEIGHT BEARING RESTRICTIONS: No  FALLS:  Has patient fallen in last 6 months? No  LIVING ENVIRONMENT: Lives with: lives with their family   OCCUPATION: desk job, works at home   PLOF: Independent  PATIENT GOALS: to have no pain with ending work day  NEXT MD VISIT: no   OBJECTIVE:  Note: Objective measures were completed at Evaluation unless otherwise noted.  DIAGNOSTIC FINDINGS:    PATIENT SURVEYS:  Eval: Oswestry Score: 11 / 50 or 22 %  COGNITION: Overall cognitive status: Within functional limits for tasks assessed     SENSATION: WFL  MUSCLE LENGTH: Bil hip flexors tight and adductors minimally   POSTURE: rounded shoulders and increased thoracic kyphosis  PALPATION: TTP and tightness noted in bil thoracic and lumbar paraspinals   LUMBAR ROM:   AROM eval  Flexion WFL  Extension Limited by 25%  Right lateral flexion WFL  Left lateral flexion WFL  Right rotation Limited by 25%  Left rotation  Limited by 25%   (Blank rows = not tested)  LOWER EXTREMITY ROM:     WFL  LOWER EXTREMITY MMT:    Bil hips grossly 4+/5   FUNCTIONAL TESTS:  Sit up test 2/3  GAIT: WFL  TREATMENT DATE:   12/03/2023: (pt arrived late for session) Nustep level 5 x4 min with PT present to discuss status Standing march with alt UE overhead press with 4# dumbbell 2x10 bilat Forward T with 4# dumbbell 2x10 bilat Supine with soft foam roll along spine:  snow angels x10 Supine with soft foam roll along spine: shoulder horizontal abduction with green tband 2x10 Standing shoulder abduction with green tband x10   12/01/23: UBE Level 2.5 3/3- PT present to discuss status Single arm row at cable column 10# 2 x 10 bilateral  Lat pull 35# 2x10  Hang from lat bar for stretch x 2  Cat cow with long red band x 10 Discussion of patient's pain location and possible contributing factors Lat stretch at counter Trigger Point Dry Needling  Subsequent Treatment: Instructions reviewed, if requested by the patient, prior to subsequent dry needling treatment.   Patient Verbal Consent Given: Yes Education Handout Provided: Previously Provided Muscles Treated: bilateral lats and rhomboids  Electrical Stimulation Performed: No Treatment Response/Outcome: Utilized skilled palpation to identify trigger points.  During dry needling able to palpate muscle twitch and muscle elongation  Soft tissue mobilization performed to further promote tissue elongation and decreased pain.         11/26/23: Nustep x 6 min level 5 Prone wing taps 2 x 10 Prone reach and pull 2 x 10 Quadruped thread the needle with thoracic rotation x 10 each side Single arm rows 5# x 10 B, then 10# x 10 B Single arm lawn mower pull 5# x 10, then 10# x 10 Lat pull 35# 2x10  Hang from lat bar for stretch x 2  Side lying open book x 10 each side Standing serratus push blue band 2x10 Self MFR with double massage ball along  spine   11/24/23: Nustep x 6 min level 4 Prone wing taps 2 x 10 Prone reach and pull 2 x 10 Quadruped thread the  needle with thoracic rotation x 10 each side Side lying open book x 10 each side Trigger Point Dry Needling Initial Treatment: Pt instructed on Dry Needling rational, procedures, and possible side effects. Pt instructed to expect mild to moderate muscle soreness later in the day and/or into the next day.  Pt instructed in methods to reduce muscle soreness. Pt instructed to continue prescribed HEP. Because Dry Needling was performed over or adjacent to a lung field, pt was educated on S/S of pneumothorax and to seek immediate medical attention should they occur.  Patient was educated on signs and symptoms of infection and other risk factors and advised to seek medical attention should they occur.  Patient verbalized understanding of these instructions and education.  Patient Verbal Consent Given: Yes Education Handout Provided: Yes Muscles Treated: rhomboids Electrical Stimulation Performed: No Treatment Response/Outcome: Skilled palpation used to identify taut bands and trigger points.  Once identified, dry needling techniques used to treat these areas.  Twitch response ellicited along with palpable elongation of muscle.  Following treatment, patient reported significant decrease in muscle tension      PATIENT EDUCATION:  Education details: [email or web address] Person educated: Patient Education method: Explanation, Demonstration, Tactile cues, Verbal cues, and Handouts Education comprehension: verbalized understanding, returned demonstration, verbal cues required, tactile cues required, and needs further education  HOME EXERCISE PROGRAM: Access Code: [email or web address] URL: [email or web address] Date: 12/03/2023 Prepared by: [name] [name]  Exercises - Cat Cow  - 1 x daily - 7 x weekly - 1 sets - 10 reps - Tail Wag  - 1 x daily - 7 x weekly - 1 sets - 10 reps - Child's Pose  with Thread the Needle  - 1 x daily - 7 x weekly - 1 sets - 5 reps - 15s holds - Quadruped Full Range Thoracic Rotation with Reach  - 1 x daily - 7 x weekly - 1 sets - 10 reps - Sidelying Open Book Thoracic Lumbar Rotation and Extension  - 1 x daily - 7 x weekly - 1 sets - 10 reps - Supine Shoulder Horizontal Abduction with Resistance  - 2 x daily - 4-5 x weekly - 1-2 sets - 10 reps - 3 sec hold - Supine Shoulder External Rotation with Resistance  - 2 x daily - 4-5 x weekly - 1-2 sets - 10 reps - 3 sec hold - Supine narrow and wide grip flexion  - 4 x daily - 4-5 x weekly - 1-2 sets - 10 reps - 3 sec hold - Supine PNF D2   - 1 x daily - 4-5 x weekly - 1-2 sets - 10 reps - 3 sec hold - Forearm Walks on Wall with Resistance Band  - 1 x daily - 7 x weekly - 1-2 sets - 10 reps - Reverse Crunch  - 1 x daily - 7 x weekly - 2 sets - 10 reps - Prone Angels  - 1 x daily - 3 x weekly - 2 sets - 10 reps - Standing Serratus Punch with Resistance  - 1 x daily - 3 x weekly - 2 sets - 10 reps - Standing Single Arm Shoulder Abduction with Resistance  - 1 x daily - 7 x weekly - 2 sets - 10 reps - Standing March with Alternating Med [facility]  - 1 x daily - 7 x weekly - 2 sets - 10 reps - Forward T Arms Overhead with Band  - 1 x daily - 7 x weekly - 2 sets -  10 reps   ASSESSMENT:  CLINICAL IMPRESSION: Ms Barnwell presents to skilled PT reporting that she is feeling a lot better after dry needling last session.  Patient is reporting that she would like to start incorporating more total body workout, so changed to NuStep and added some exercises that will strengthening upper and lower body.  Patient with some difficulty with forward T secondary to decreased balance, but able to complete.  Patient provided with updated HEP to include new exercises performed today.  Patient continues to require skilled PT to progress towards goal related activities.   OBJECTIVE IMPAIRMENTS: decreased activity tolerance, decreased  coordination, decreased endurance, decreased mobility, decreased strength, increased fascial restrictions, increased muscle spasms, impaired flexibility, improper body mechanics, postural dysfunction, and pain.   ACTIVITY LIMITATIONS: carrying, lifting, sitting, standing, and squatting  PARTICIPATION LIMITATIONS: meal prep, community activity, and occupation  PERSONAL FACTORS: 1 comorbidity: history of back pain are also affecting patient's functional outcome.   REHAB POTENTIAL: Good  CLINICAL DECISION MAKING: Stable/uncomplicated  EVALUATION COMPLEXITY: Low   GOALS: Goals reviewed with patient? Yes  SHORT TERM GOALS: Target date: 12/01/23  Pt to be I with HEP for carry over and continuing recommendations for improved outcomes.   Baseline: Goal status: MET 11/24/23  2.  Pt to report no more than 2/10 back pain for improved tolerance to sitting for work at least 2 hours.  Baseline:  Goal status: In progress  3.  Pt to demonstrate full ROM at thoracic spine for improved posture at midline and upright tolerance.  Baseline:  Goal status: In Progress   LONG TERM GOALS: Target date: 02/02/24  Pt to be I with advanced HEP for carry over and continuing recommendations for improved outcomes.   Baseline:  Goal status: INITIAL  2.  Pt to score no higher than 5/50 owestry indicating improvement in functional mobility with less to no pain.  Baseline:  Goal status: INITIAL  3.  Pt to report 0/10 back pain for improved tolerance to sitting for work at least 2 hours.   Baseline:  Goal status: INITIAL  4.  Pt to report ability to walk at least 1 hour without increased pain for improved ability to have prior level of activity and increase fitness goals. Baseline:  Goal status: INITIAL   PLAN:  PT FREQUENCY: 2x/week  PT DURATION: 6 weeks  PLANNED INTERVENTIONS: 97110-Therapeutic exercises, 97530- Therapeutic activity, [id number]- Neuromuscular re-education, 97535- Self Care, 02859-  Manual therapy, [phone]- Canalith repositioning, [id number]- Aquatic Therapy, [phone]- Electrical stimulation (manual), [id number]- Vasopneumatic device, [id number]- Ultrasound, [id number]- Traction (mechanical), [id number]- Ionotophoresis 4mg /ml Dexamethasone, 79439 (1-2 muscles), 20561 (3+ muscles)- Dry Needling, Patient/Family education, Balance training, Stair training, Taping, Joint mobilization, Joint manipulation, Spinal manipulation, Spinal mobilization, Scar mobilization, DME instructions, Cryotherapy, Moist heat, and Biofeedback.  PLAN FOR NEXT SESSION: Assess response to DN #2 and increased strengthening, Cont core and posture strengthening and review/progress HEP prn, mobility mid back, cont manual therapy prn    [name] Laming, PT, DPT 12/03/23, 12:03 PM  [facility] [address], [address] [place], [place] [address] Phone # [phone] Fax [phone]

## 2023-12-07 ENCOUNTER — Encounter: Payer: Self-pay | Admitting: Cardiology

## 2023-12-07 ENCOUNTER — Ambulatory Visit: Attending: Cardiology | Admitting: Cardiology

## 2023-12-07 VITALS — BP 130/80 | HR 69 | Ht 66.0 in | Wt 127.0 lb

## 2023-12-07 DIAGNOSIS — I4729 Other ventricular tachycardia: Secondary | ICD-10-CM

## 2023-12-07 DIAGNOSIS — I10 Essential (primary) hypertension: Secondary | ICD-10-CM | POA: Diagnosis not present

## 2023-12-07 DIAGNOSIS — R55 Syncope and collapse: Secondary | ICD-10-CM

## 2023-12-07 DIAGNOSIS — E0590 Thyrotoxicosis, unspecified without thyrotoxic crisis or storm: Secondary | ICD-10-CM

## 2023-12-07 NOTE — Patient Instructions (Addendum)

## 2023-12-07 NOTE — Progress Notes (Unsigned)
 Cardiology Office Note:    Date:  12/07/2023   ID:  [name] [name], DOB 1975/10/06, MRN [id number]  PCP:  Antonio Cyndee [name], DO  Cardiologist:  [name] Fitch, MD    Referring MD: Antonio Cyndee, [name], *   Chief Complaint  Patient presents with   Follow-up  I had another episode  History of Present Illness:    [name] [name] is a 48 y.o. female past medical history significant for episode of syncope total of 30 look like vasovagal episodes she requested to be see today because couple days ago she had another episodes.  Apparently she had excellent day, she felt good.  She got good breakfast she did have 3 Mojitos after that she was watching game sitting in the hot sun.  In the middle of the game started feeling strange tingling not herself, her husband was with her noticed that she kind of was very slow in responses she looked pale but not as bad as previously when she passed out.  That sensation lasted for a few good minutes.  They were trying to get out of there eventually they succeeded they [facility] she was noted to be very pale also very slow with responses, blood pressure was checked EKG was checked was pretty decent.  And then they went home.  She when she came home she slept for many hours after that and then everything was fine.  Interesting event I am not sure exactly if this is purely vasovagal what she describes.  Couple observations apparently she takes some hormonal therapy given by some doctor and the dose of the medication has been increased day before, also contributing is the fact that she was in a hot environment of the sun and did have some alcohol before.  She had a lot of ideas about what potentially is responsible for her symptomatology.  She thinks may be allergy to different substances.  Past Medical History:  Diagnosis Date   Allergy    Basal cell carcinoma    abdomen   Celiac disease 2009   Functional dyspepsia 09/05/2012    Gastritis    GERD (gastroesophageal reflux disease) 08/08/2012   Guillain Barr syndrome 04/01/2007   After influenza vaccine   Hyperthyroidism 04/01/2007   Hypoglycemia, unspecified 10/26/2007    Past Surgical History:  Procedure Laterality Date   CESAREAN SECTION     COLONOSCOPY  05/12/2007   [place]   ESOPHAGOGASTRODUODENOSCOPY     SKIN CANCER EXCISION     abdomin   WISDOM TOOTH EXTRACTION      Current Medications: Current Meds  Medication Sig   cyclobenzaprine  (FLEXERIL ) 10 MG tablet Take 1 tablet (10 mg total) by mouth 3 (three) times daily as needed for muscle spasms.   DIGESTIVE ENZYMES    FA-Cyanocobalamin -B6-D-Ca-Aloe (RX SUPPORT HB/REFLUX/ALOE PO) Take by mouth.   Ferrous Sulfate (IRON) 325 (65 FE) MG TABS Take 65 mg by mouth daily.   glucosamine-chondroitin 500-400 MG tablet Take 1 tablet by mouth 3 (three) times daily.   Loratadine (CLARITIN) 10 MG CAPS    losartan  (COZAAR ) 50 MG tablet Take 1 tablet (50 mg total) by mouth daily.   Misc Natural Products (CORTISOL MANAGER PO) Take by mouth.   NON FORMULARY Take 1 tablet by mouth at bedtime. CBD (Patient taking differently: Take 1 tablet by mouth at bedtime. CBD - as needed)   NON FORMULARY    Probiotic Product (UP4 PROBIOTICS PO)    PROGESTERONE   MICRONIZED, PROGESTINS,      Allergies:   Banana, Gluten meal, Influenza vac split quad, Molds & smuts, Peanut (diagnostic), and Tilactase   Social History   Socioeconomic History   Marital status: Married    Spouse name: [name]   Number of children: 2   Years of education: 16   Highest education level: Bachelor's degree (e.g., BA, AB, BS)  Occupational History   Occupation: Social worker: [employer]  Tobacco Use   Smoking status: Former    Current packs/day: 0.00    Types: Cigarettes    Quit date: 01/19/1998    Years since quitting: 25.8   Smokeless tobacco: Never   Tobacco comments:    About age 55   Vaping Use   Vaping status: Never  Used  Substance and Sexual Activity   Alcohol use: Yes    Alcohol/week: 4.0 - 8.0 standard drinks of alcohol    Types: 4 - 8 Glasses of wine per week   Drug use: Yes    Comment: CBD   Sexual activity: Yes    Partners: Male  Other Topics Concern   Not on file  Social History Narrative   Married with 2 children   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 [place]   Occasional alcohol no drugs or tobacco use at this time   Exercise-- 2 x a week    R handed   Caffeine: a cup of tea and Coffee a day   Social Drivers of Corporate investment banker Strain: Low Risk  (05/17/2023)   Overall Financial Resource Strain (CARDIA)    Difficulty of Paying Living Expenses: Not hard at all  Food Insecurity: No Food Insecurity (05/17/2023)   Hunger Vital Sign    Worried About Running Out of Food in the Last Year: Never true    Ran Out of Food in the Last Year: Never true  Transportation Needs: No Transportation Needs (05/17/2023)   PRAPARE - Administrator, Civil Service (Medical): No    Lack of Transportation (Non-Medical): No  Physical Activity: Sufficiently Active (05/17/2023)   Exercise Vital Sign    Days of Exercise per Week: 5 days    Minutes of Exercise per Session: 40 min  Stress: Stress Concern Present (05/17/2023)   [employer] of Occupational Health - Occupational Stress Questionnaire    Feeling of Stress : Very much  Social Connections: Moderately Isolated (05/17/2023)   Social Connection and Isolation Panel    Frequency of Communication with Friends and Family: More than three times a week    Frequency of Social Gatherings with Friends and Family: Once a week    Attends Religious Services: Never    Database administrator or Organizations: No    Attends Engineer, structural: Not on file    Marital Status: Married     Family History: The patient's family history includes Autoimmune disease in an other family member; Bipolar disorder in her  sister; Depression in her father; Diabetes in her father; Heart attack in her father; Hyperlipidemia in her father; Hypertension in her father, mother, sister, and another family member; Leukemia (age of onset: 30) in her mother; Lupus in her cousin; Memory loss in her mother; Multiple sclerosis in her cousin; Prostate cancer (age of onset: 69) in her father; Stroke in her maternal grandmother and mother. There is no history of Colon cancer, Esophageal cancer, Stomach cancer, Rectal cancer, or Colon polyps. ROS:  Please see the history of present illness.    All 14 point review of systems negative except as described per history of present illness  EKGs/Labs/Other Studies Reviewed:         Recent Labs: 05/18/2023: ALT 25; BUN 10; Creatinine, Ser 0.76; Hemoglobin 14.4; Platelets 304.0; Potassium 4.0; Sodium 141 09/29/2023: TSH 1.40  Recent Lipid Panel    Component Value Date/Time   CHOL 191 05/18/2023 1039   TRIG 63.0 05/18/2023 1039   HDL 85.40 05/18/2023 1039   CHOLHDL 2 05/18/2023 1039   VLDL 12.6 05/18/2023 1039   LDLCALC 93 05/18/2023 1039    Physical Exam:    VS:  BP 130/80   Pulse 69   Ht 5' 6 (1.676 m)   Wt 127 lb (57.6 kg)   LMP 02/24/2019   SpO2 99%   BMI 20.50 kg/m     Wt Readings from Last 3 Encounters:  12/07/23 127 lb (57.6 kg)  11/25/23 127 lb 12.8 oz (58 kg)  09/29/23 124 lb (56.2 kg)     GEN:  Well nourished, well developed in no acute distress HEENT: Normal NECK: No JVD; No carotid bruits LYMPHATICS: No lymphadenopathy CARDIAC: RRR, no murmurs, no rubs, no gallops RESPIRATORY:  Clear to auscultation without rales, wheezing or rhonchi  ABDOMEN: Soft, non-tender, non-distended MUSCULOSKELETAL:  No edema; No deformity  SKIN: Warm and dry LOWER EXTREMITIES: no swelling NEUROLOGIC:  Alert and oriented x 3 PSYCHIATRIC:  Normal affect   ASSESSMENT:    1. Vasovagal syncope   2. Hyperthyroidism   3. Primary hypertension   4. Nonsustained ventricular  tachycardia (HCC)    PLAN:    In order of problems listed above:  Situation getting more complicated which she described today to me not sure exactly if it is purely vasovagal as some clear component to it but not everything fits to that.  I think is a good idea to ask her to see allergist to see if she got allergic to mold but she has suspicion for also mast cell disease.  Will be referred to Dr. Lucrecia Hypothyroid is stable. Essential hypertension doing well Nonsustained ventricular tachycardia, asymptomatic, stress test was excellent, echocardiogram preserved ejection fraction very low risk scenario Cardiac ultrasound showing no significant blockages   Medication Adjustments/Labs and Tests Ordered: Current medicines are reviewed at length with the patient today.  Concerns regarding medicines are outlined above.  No orders of the defined types were placed in this encounter.  Medication changes: No orders of the defined types were placed in this encounter.   Signed, [name] DOROTHA Fitch, MD, [facility] 12/07/2023 4:56 PM    [facility]

## 2023-12-08 ENCOUNTER — Ambulatory Visit: Admitting: Physical Therapy

## 2023-12-08 ENCOUNTER — Encounter: Payer: Self-pay | Admitting: Physical Therapy

## 2023-12-08 DIAGNOSIS — R293 Abnormal posture: Secondary | ICD-10-CM

## 2023-12-08 DIAGNOSIS — R252 Cramp and spasm: Secondary | ICD-10-CM

## 2023-12-08 DIAGNOSIS — M62838 Other muscle spasm: Secondary | ICD-10-CM

## 2023-12-08 DIAGNOSIS — M6281 Muscle weakness (generalized): Secondary | ICD-10-CM | POA: Diagnosis not present

## 2023-12-08 DIAGNOSIS — M5459 Other low back pain: Secondary | ICD-10-CM

## 2023-12-08 NOTE — Therapy (Signed)
 OUTPATIENT PHYSICAL THERAPY THORACOLUMBAR TREATMENT   Patient Name: [name] [name] MRN: [id number] DOB:[birth year], 48 y.o., female Today's Date: 12/08/2023  END OF SESSION:  PT End of Session - 12/08/23 1323     Visit Number 8    Date for Recertification  02/02/24    Authorization Type BCBS    PT Start Time 1230    PT Stop Time 1312    PT Time Calculation (min) 42 min    Activity Tolerance Patient tolerated treatment well    Behavior During Therapy [facility] for tasks assessed/performed             Past Medical History:  Diagnosis Date   Allergy    Basal cell carcinoma    abdomen   Celiac disease 2009   Functional dyspepsia 09/05/2012   Gastritis    GERD (gastroesophageal reflux disease) 08/08/2012   Guillain Barr syndrome 04/01/2007   After influenza vaccine   Hyperthyroidism 04/01/2007   Hypoglycemia, unspecified 10/26/2007   Past Surgical History:  Procedure Laterality Date   CESAREAN SECTION     COLONOSCOPY  05/12/2007   [place]   ESOPHAGOGASTRODUODENOSCOPY     SKIN CANCER EXCISION     abdomin   WISDOM TOOTH EXTRACTION     Patient Active Problem List   Diagnosis Date Noted   Nonsustained ventricular tachycardia (HCC) 08/24/2023   Vasovagal syncope 07/09/2023   Hashimoto's disease 07/07/2023   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   Small intestinal bacterial overgrowth 09/13/2022   Primary hypertension 06/04/2022   Preventative health care 05/07/2022   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   Fatigue 08/20/2021   Functional dyspepsia 09/05/2012   GERD (gastroesophageal reflux disease) 08/08/2012   Acne 08/08/2012   Hypoglycemia, unspecified 10/26/2007   Celiac disease 10/26/2007   Hyperthyroidism 04/01/2007   Guillain Barr syndrome 04/01/2007    PCP: Antonio Cyndee [name], DO  REFERRING PROVIDER: Antonio Cyndee [name], DO  REFERRING  DIAG: M54.9 (ICD-10-CM) - Mid back pain  Rationale for Evaluation and Treatment: Rehabilitation  THERAPY DIAG:  Other low back pain  Muscle weakness (generalized)  Abnormal posture  Cramp and spasm  Other muscle spasm  ONSET DATE: unsure specifically ~10/24/23    SUBJECTIVE:                                                                                                                                                                                           SUBJECTIVE STATEMENT: Patient reports she is doing good today. She is having a little tightness in her back but it  is a lot better.   PERTINENT HISTORY:  Guillain Barr syndrome, Hyperthyroidism, Hashimoto's disease, CESAREAN SECTION, Celiac disease  PAIN:  Are you having pain? Yes NPRS scale: 1-2/10 Pain location: thoracic region Pain description: intermittent and aching  Aggravating factors: certain movements Relieving factors: stretching and rest   PRECAUTIONS: None  RED FLAGS: None   WEIGHT BEARING RESTRICTIONS: No  FALLS:  Has patient fallen in last 6 months? No  LIVING ENVIRONMENT: Lives with: lives with their family   OCCUPATION: desk job, works at home   PLOF: Independent  PATIENT GOALS: to have no pain with ending work day  NEXT MD VISIT: no   OBJECTIVE:  Note: Objective measures were completed at Evaluation unless otherwise noted.  DIAGNOSTIC FINDINGS:    PATIENT SURVEYS:  Eval: Oswestry Score: 11 / 50 or 22 %  COGNITION: Overall cognitive status: Within functional limits for tasks assessed     SENSATION: WFL  MUSCLE LENGTH: Bil hip flexors tight and adductors minimally   POSTURE: rounded shoulders and increased thoracic kyphosis  PALPATION: TTP and tightness noted in bil thoracic and lumbar paraspinals   LUMBAR ROM:   AROM eval  Flexion WFL  Extension Limited by 25%  Right lateral flexion WFL  Left lateral flexion WFL  Right rotation Limited by 25%  Left rotation  Limited by 25%   (Blank rows = not tested)  LOWER EXTREMITY ROM:     WFL  LOWER EXTREMITY MMT:    Bil hips grossly 4+/5   FUNCTIONAL TESTS:  Sit up test 2/3  GAIT: WFL  TREATMENT DATE:  12/08/2023: UBE level 3 3/3 with PT present to discuss status Modified deadlift with green TB 2 x 10 Pallof press with green TB Bent over row 7# Y's at wall holding 1# DB 2 x 8 Lat [place] shoulder stretch x 2 Standing march with alt UE overhead press with 4# dumbbell x10 bilat and this same side raises x 10 reach SL shoulder ER 4# DB x 10 bilateral    12/03/2023: (pt arrived late for session) Nustep level 5 x4 min with PT present to discuss status Standing march with alt UE overhead press with 4# dumbbell 2x10 bilat Forward T with 4# dumbbell 2x10 bilat Supine with soft foam roll along spine:  snow angels x10 Supine with soft foam roll along spine: shoulder horizontal abduction with green tband 2x10 Standing shoulder abduction with green tband x10   12/01/23: UBE Level 2.5 3/3- PT present to discuss status Single arm row at cable column 10# 2 x 10 bilateral  Lat pull 35# 2x10  Hang from lat bar for stretch x 2  Cat cow with long red band x 10 Discussion of patient's pain location and possible contributing factors Lat stretch at counter Trigger Point Dry Needling  Subsequent Treatment: Instructions reviewed, if requested by the patient, prior to subsequent dry needling treatment.   Patient Verbal Consent Given: Yes Education Handout Provided: Previously Provided Muscles Treated: bilateral lats and rhomboids  Electrical Stimulation Performed: No Treatment Response/Outcome: Utilized skilled palpation to identify trigger points.  During dry needling able to palpate muscle twitch and muscle elongation  Soft tissue mobilization performed to further promote tissue elongation and decreased pain.         11/26/23: Nustep x 6 min level 5 Prone wing taps 2 x 10 Prone reach and  pull 2 x 10 Quadruped thread the needle with thoracic rotation x 10 each side Single arm rows 5# x 10 B, then 10#  x 10 B Single arm lawn mower pull 5# x 10, then 10# x 10 Lat pull 35# 2x10  Hang from lat bar for stretch x 2  Side lying open book x 10 each side Standing serratus push blue band 2x10 Self MFR with double massage ball along spine   PATIENT EDUCATION:  Education details: Pensions consultant Person educated: Patient Education method: Explanation, Demonstration, Actor cues, Verbal cues, and Handouts Education comprehension: verbalized understanding, returned demonstration, verbal cues required, tactile cues required, and needs further education  HOME EXERCISE PROGRAM: Access Code: [email or web address] URL: [email or web address] Date: 12/08/2023 Prepared by: Kristeen Sar  Exercises - Cat Cow  - 1 x daily - 7 x weekly - 1 sets - 10 reps - Tail Wag  - 1 x daily - 7 x weekly - 1 sets - 10 reps - Child's Pose with Thread the Needle  - 1 x daily - 7 x weekly - 1 sets - 5 reps - 15s holds - Quadruped Full Range Thoracic Rotation with Reach  - 1 x daily - 7 x weekly - 1 sets - 10 reps - Sidelying Open Book Thoracic Lumbar Rotation and Extension  - 1 x daily - 7 x weekly - 1 sets - 10 reps - Supine Shoulder Horizontal Abduction with Resistance  - 2 x daily - 4-5 x weekly - 1-2 sets - 10 reps - 3 sec hold - Supine Shoulder External Rotation with Resistance  - 2 x daily - 4-5 x weekly - 1-2 sets - 10 reps - 3 sec hold - Supine narrow and wide grip flexion  - 4 x daily - 4-5 x weekly - 1-2 sets - 10 reps - 3 sec hold - Supine PNF D2   - 1 x daily - 4-5 x weekly - 1-2 sets - 10 reps - 3 sec hold - Forearm Walks on Wall with Resistance Band  - 1 x daily - 7 x weekly - 1-2 sets - 10 reps - Reverse Crunch  - 1 x daily - 7 x weekly - 2 sets - 10 reps - Prone Angels  - 1 x daily - 3 x weekly - 2 sets - 10 reps - Standing Serratus Punch with Resistance  - 1 x daily - 3 x weekly - 2 sets - 10  reps - Standing Single Arm Shoulder Abduction with Resistance  - 1 x daily - 7 x weekly - 2 sets - 10 reps - Standing March with Alternating Med [facility]  - 1 x daily - 7 x weekly - 2 sets - 10 reps - Forward T Arms Overhead with Band  - 1 x daily - 7 x weekly - 2 sets - 10 reps - Deadlift with Resistance  - 1 x daily - 7 x weekly - 1-2 sets - 10 reps - Standing Anti-Rotation Press with Anchored Resistance  - 1 x daily - 7 x weekly - 1-2 sets - 12 reps - Standing Bent Over Single Arm Shoulder Row  - 1 x daily - 7 x weekly - 1-2 sets - 12 reps - Low Trap Setting at Wall  - 1 x daily - 7 x weekly - 2 sets - 8-10 reps - Standing March with Alternating Med [facility]  - 1 x daily - 7 x weekly - 1-2 sets - 10 reps - Seated Cat Cow  - 1 x daily - 7 x weekly - 1 sets - 5-10 reps - Seated Thoracic Rotation: Open Book  - 1  x daily - 7 x weekly - 1 sets - 5 reps - Sidelying Shoulder External Rotation Dumbbell  - 1 x daily - 7 x weekly - 1-2 sets - 10 reps  ASSESSMENT:  CLINICAL IMPRESSION:' Scottie is responding well to skilled therapy. She verbalized feeling like her tightness is improving. Provided patient education and discussion on quality of life and time management for exercise. Discussed motivators, challenges, and goals for future. Updated HEP to include postural strengthening and core exercises patient can do throughout her day as she is able. PT provided verbal and tactile cues as needed for form correction. Overall, patient tolerated treatment session well. Patient will benefit from skilled PT to address the below impairments and improve overall function.   OBJECTIVE IMPAIRMENTS: decreased activity tolerance, decreased coordination, decreased endurance, decreased mobility, decreased strength, increased fascial restrictions, increased muscle spasms, impaired flexibility, improper body mechanics, postural dysfunction, and pain.   ACTIVITY LIMITATIONS: carrying, lifting, sitting, standing, and  squatting  PARTICIPATION LIMITATIONS: meal prep, community activity, and occupation  PERSONAL FACTORS: 1 comorbidity: history of back pain are also affecting patient's functional outcome.   REHAB POTENTIAL: Good  CLINICAL DECISION MAKING: Stable/uncomplicated  EVALUATION COMPLEXITY: Low   GOALS: Goals reviewed with patient? Yes  SHORT TERM GOALS: Target date: 12/01/23  Pt to be I with HEP for carry over and continuing recommendations for improved outcomes.   Baseline: Goal status: MET 11/24/23  2.  Pt to report no more than 2/10 back pain for improved tolerance to sitting for work at least 2 hours.  Baseline:  Goal status: In progress  3.  Pt to demonstrate full ROM at thoracic spine for improved posture at midline and upright tolerance.  Baseline:  Goal status: In Progress   LONG TERM GOALS: Target date: 02/02/24  Pt to be I with advanced HEP for carry over and continuing recommendations for improved outcomes.   Baseline:  Goal status: INITIAL  2.  Pt to score no higher than 5/50 owestry indicating improvement in functional mobility with less to no pain.  Baseline:  Goal status: INITIAL  3.  Pt to report 0/10 back pain for improved tolerance to sitting for work at least 2 hours.   Baseline:  Goal status: INITIAL  4.  Pt to report ability to walk at least 1 hour without increased pain for improved ability to have prior level of activity and increase fitness goals. Baseline:  Goal status: INITIAL   PLAN:  PT FREQUENCY: 2x/week  PT DURATION: 6 weeks  PLANNED INTERVENTIONS: 97110-Therapeutic exercises, 97530- Therapeutic activity, [id number]- Neuromuscular re-education, 97535- Self Care, [id number]- Manual therapy, [phone]- Canalith repositioning, [id number]- Aquatic Therapy, [phone]- Electrical stimulation (manual), [id number]- Vasopneumatic device, [id number]- Ultrasound, [id number]- Traction (mechanical), [id number]- Ionotophoresis 4mg /ml Dexamethasone, [id number] (1-2 muscles), [id number] (3+ muscles)- Dry  Needling, Patient/Family education, Balance training, Stair training, Taping, Joint mobilization, Joint manipulation, Spinal manipulation, Spinal mobilization, Scar mobilization, DME instructions, Cryotherapy, Moist heat, and Biofeedback.  PLAN FOR NEXT SESSION: assess updated HEP; increased strengthening, Cont core and posture strengthening and review/progress HEP prn, mobility mid back, cont manual therapy prn    Douglass Dunshee, PT, DPT 12/08/23 1:24 PM [facility] [address] [place], [place] [address] Phone # [phone] Fax [phone]

## 2023-12-09 NOTE — Therapy (Signed)
 OUTPATIENT PHYSICAL THERAPY THORACOLUMBAR TREATMENT   Patient Name: [name] [name] MRN: [id number] DOB:[birth year], 48 y.o., female Today's Date: 12/10/2023  END OF SESSION:  PT End of Session - 12/10/23 1022     Visit Number 9    Date for Recertification  02/02/24    Authorization Type [employer]    PT Start Time 1019    PT Stop Time 1101    PT Time Calculation (min) 42 min    Activity Tolerance Patient tolerated treatment well    Behavior During Therapy [facility] for tasks assessed/performed              Past Medical History:  Diagnosis Date   Allergy    Basal cell carcinoma    abdomen   Celiac disease 2009   Functional dyspepsia 09/05/2012   Gastritis    GERD (gastroesophageal reflux disease) 08/08/2012   Guillain Barr syndrome 04/01/2007   After influenza vaccine   Hyperthyroidism 04/01/2007   Hypoglycemia, unspecified 10/26/2007   Past Surgical History:  Procedure Laterality Date   CESAREAN SECTION     COLONOSCOPY  05/12/2007   Gessner   ESOPHAGOGASTRODUODENOSCOPY     SKIN CANCER EXCISION     abdomin   WISDOM TOOTH EXTRACTION     Patient Active Problem List   Diagnosis Date Noted   Nonsustained ventricular tachycardia (HCC) 08/24/2023   Vasovagal syncope 07/09/2023   Hashimoto's disease 07/07/2023   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   Small intestinal bacterial overgrowth 09/13/2022   Primary hypertension 06/04/2022   Preventative health care 05/07/2022   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   Fatigue 08/20/2021   Functional dyspepsia 09/05/2012   GERD (gastroesophageal reflux disease) 08/08/2012   Acne 08/08/2012   Hypoglycemia, unspecified 10/26/2007   Celiac disease 10/26/2007   Hyperthyroidism 04/01/2007   Guillain Barr syndrome 04/01/2007    PCP: Antonio Cyndee [name], DO  REFERRING PROVIDER: Antonio Cyndee [name], DO  REFERRING  DIAG: M54.9 (ICD-10-CM) - Mid back pain  Rationale for Evaluation and Treatment: Rehabilitation  THERAPY DIAG:  Other low back pain  Muscle weakness (generalized)  Abnormal posture  Cramp and spasm  Other muscle spasm  ONSET DATE: unsure specifically ~10/24/23    SUBJECTIVE:                                                                                                                                                                                           SUBJECTIVE STATEMENT: The muscles are feeling much better. I have one spot in my spine that is still a  little crunchy. I do feel some muscular pull with the standing Ys. My low back feels better. I didn't think it would with the arthritis.  PERTINENT HISTORY:  Guillain Barr syndrome, Hyperthyroidism, Hashimoto's disease, CESAREAN SECTION, Celiac disease  PAIN:  Are you having pain? Yes NPRS scale: 1-2/10 Pain location: thoracic region Pain description: intermittent and aching  Aggravating factors: certain movements Relieving factors: stretching and rest   PRECAUTIONS: None  RED FLAGS: None   WEIGHT BEARING RESTRICTIONS: No  FALLS:  Has patient fallen in last 6 months? No  LIVING ENVIRONMENT: Lives with: lives with their family   OCCUPATION: desk job, works at home   PLOF: Independent  PATIENT GOALS: to have no pain with ending work day  NEXT MD VISIT: no   OBJECTIVE:  Note: Objective measures were completed at Evaluation unless otherwise noted.  DIAGNOSTIC FINDINGS:    PATIENT SURVEYS:  Eval: Oswestry Score: 11 / 50 or 22 %  COGNITION: Overall cognitive status: Within functional limits for tasks assessed     SENSATION: WFL  MUSCLE LENGTH: Bil hip flexors tight and adductors minimally   POSTURE: rounded shoulders and increased thoracic kyphosis  PALPATION: TTP and tightness noted in bil thoracic and lumbar paraspinals   LUMBAR ROM:   AROM eval  Flexion WFL  Extension Limited by 25%   Right lateral flexion WFL  Left lateral flexion WFL  Right rotation Limited by 25%  Left rotation Limited by 25%   (Blank rows = not tested)  LOWER EXTREMITY ROM:     WFL  LOWER EXTREMITY MMT:    Bil hips grossly 4+/5   FUNCTIONAL TESTS:  Sit up test 2/3  GAIT: WFL  TREATMENT DATE:   12/10/2023: UBE level 3 3/3 with PT present to discuss status Thoracic ext off head of bed with double ball at area of pain in T spine Seated MET for Tspine using isometric rhomboids Deadlift with 5# ea hand 2 x 10 Pallof press with green TB x 10 B  Bow and arrow green x 10  Y's at wall holding 1# DB 2 x 8 Assessed lat tightness with dowel - R tighter 90/90 lat stretch with dowel OH x 10 Lat Pulldown40# x 20 Lat hang for stretch  12/08/2023: UBE level 3 3/3 with PT present to discuss status Modified deadlift with green TB 2 x 10 Pallof press with green TB Bent over row 7# Y's at wall holding 1# DB 2 x 8 Lat Pulldown shoulder stretch x 2 Standing march with alt UE overhead press with 4# dumbbell x10 bilat and this same side raises x 10 reach SL shoulder ER 4# DB x 10 bilateral    12/03/2023: (pt arrived late for session) Nustep level 5 x4 min with PT present to discuss status Standing march with alt UE overhead press with 4# dumbbell 2x10 bilat Forward T with 4# dumbbell 2x10 bilat Supine with soft foam roll along spine:  snow angels x10 Supine with soft foam roll along spine: shoulder horizontal abduction with green tband 2x10 Standing shoulder abduction with green tband x10   12/01/23: UBE Level 2.5 3/3- PT present to discuss status Single arm row at cable column 10# 2 x 10 bilateral  Lat pull 35# 2x10  Hang from lat bar for stretch x 2  Cat cow with long red band x 10 Discussion of patient's pain location and possible contributing factors Lat stretch at counter Trigger Point Dry Needling  Subsequent Treatment: Instructions reviewed, if requested by the  patient, prior  to subsequent dry needling treatment.   Patient Verbal Consent Given: Yes Education Handout Provided: Previously Provided Muscles Treated: bilateral lats and rhomboids  Electrical Stimulation Performed: No Treatment Response/Outcome: Utilized skilled palpation to identify trigger points.  During dry needling able to palpate muscle twitch and muscle elongation  Soft tissue mobilization performed to further promote tissue elongation and decreased pain.         11/26/23: Nustep x 6 min level 5 Prone wing taps 2 x 10 Prone reach and pull 2 x 10 Quadruped thread the needle with thoracic rotation x 10 each side Single arm rows 5# x 10 B, then 10# x 10 B Single arm lawn mower pull 5# x 10, then 10# x 10 Lat pull 35# 2x10  Hang from lat bar for stretch x 2  Side lying open book x 10 each side Standing serratus push blue band 2x10 Self MFR with double massage ball along spine   PATIENT EDUCATION:  Education details: Pensions consultant Person educated: Patient Education method: Programmer, multimedia, Demonstration, Actor cues, Verbal cues, and Handouts Education comprehension: verbalized understanding, returned demonstration, verbal cues required, tactile cues required, and needs further education  HOME EXERCISE PROGRAM: Access Code: [email or web address] URL: [email or web address] Date: 12/10/2023 Prepared by: Mliss  Exercises - Cat Cow  - 1 x daily - 7 x weekly - 1 sets - 10 reps - Tail Wag  - 1 x daily - 7 x weekly - 1 sets - 10 reps - Child's Pose with Thread the Needle  - 1 x daily - 7 x weekly - 1 sets - 5 reps - 15s holds - Quadruped Full Range Thoracic Rotation with Reach  - 1 x daily - 7 x weekly - 1 sets - 10 reps - Sidelying Open Book Thoracic Lumbar Rotation and Extension  - 1 x daily - 7 x weekly - 1 sets - 10 reps - Supine Shoulder Horizontal Abduction with Resistance  - 2 x daily - 4-5 x weekly - 1-2 sets - 10 reps - 3 sec hold - Supine Shoulder External Rotation with Resistance  -  2 x daily - 4-5 x weekly - 1-2 sets - 10 reps - 3 sec hold - Supine narrow and wide grip flexion  - 4 x daily - 4-5 x weekly - 1-2 sets - 10 reps - 3 sec hold - Supine PNF D2   - 1 x daily - 4-5 x weekly - 1-2 sets - 10 reps - 3 sec hold - Forearm Walks on Wall with Resistance Band  - 1 x daily - 7 x weekly - 1-2 sets - 10 reps - Reverse Crunch  - 1 x daily - 7 x weekly - 2 sets - 10 reps - Prone Angels  - 1 x daily - 3 x weekly - 2 sets - 10 reps - Standing Serratus Punch with Resistance  - 1 x daily - 3 x weekly - 2 sets - 10 reps - Standing Single Arm Shoulder Abduction with Resistance  - 1 x daily - 7 x weekly - 2 sets - 10 reps - Standing March with Alternating [facility]  - 1 x daily - 7 x weekly - 2 sets - 10 reps - Forward T Arms Overhead with Band  - 1 x daily - 7 x weekly - 2 sets - 10 reps - Deadlift with Resistance  - 1 x daily - 7 x weekly - 1-2 sets - 10 reps - Standing Anti-Rotation Press with  Anchored Resistance  - 1 x daily - 7 x weekly - 1-2 sets - 12 reps - Standing Bent Over Single Arm Shoulder Row  - 1 x daily - 7 x weekly - 1-2 sets - 12 reps - Low Trap Setting at Wall  - 1 x daily - 7 x weekly - 2 sets - 8-10 reps - Standing March with Alternating [facility]  - 1 x daily - 7 x weekly - 1-2 sets - 10 reps - Seated Cat Cow  - 1 x daily - 7 x weekly - 1 sets - 5-10 reps - Seated Thoracic Rotation: Open Book  - 1 x daily - 7 x weekly - 1 sets - 5 reps - Sidelying Shoulder External Rotation Dumbbell  - 1 x daily - 7 x weekly - 1-2 sets - 10 reps - Drawing Bow  - 1 x daily - 3 x weekly - 2 sets - 10 reps - Lat Pull Down  - 1 x daily - 3 x weekly - 2 sets - 10 reps  ASSESSMENT:  CLINICAL IMPRESSION:'  Patient reporting good improvements overall in muscle pain and tightness. She still reports one specific site of pain in the throacic spine. She got good relief of this with extension over double massage ball. Able to progress dead lifts to 5# weights today. Some cues  need for head position, but otherwise good form. Her R lat is tight, so we worked on stretching this as well.   OBJECTIVE IMPAIRMENTS: decreased activity tolerance, decreased coordination, decreased endurance, decreased mobility, decreased strength, increased fascial restrictions, increased muscle spasms, impaired flexibility, improper body mechanics, postural dysfunction, and pain.   ACTIVITY LIMITATIONS: carrying, lifting, sitting, standing, and squatting  PARTICIPATION LIMITATIONS: meal prep, community activity, and occupation  PERSONAL FACTORS: 1 comorbidity: history of back pain are also affecting patient's functional outcome.   REHAB POTENTIAL: Good  CLINICAL DECISION MAKING: Stable/uncomplicated  EVALUATION COMPLEXITY: Low   GOALS: Goals reviewed with patient? Yes  SHORT TERM GOALS: Target date: 12/01/23  Pt to be I with HEP for carry over and continuing recommendations for improved outcomes.   Baseline: Goal status: MET 11/24/23  2.  Pt to report no more than 2/10 back pain for improved tolerance to sitting for work at least 2 hours.  Baseline:  Goal status: In progress  3.  Pt to demonstrate full ROM at thoracic spine for improved posture at midline and upright tolerance.  Baseline:  Goal status: In Progress   LONG TERM GOALS: Target date: 02/02/24  Pt to be I with advanced HEP for carry over and continuing recommendations for improved outcomes.   Baseline:  Goal status: INITIAL  2.  Pt to score no higher than 5/50 owestry indicating improvement in functional mobility with less to no pain.  Baseline:  Goal status: INITIAL  3.  Pt to report 0/10 back pain for improved tolerance to sitting for work at least 2 hours.   Baseline:  Goal status: INITIAL  4.  Pt to report ability to walk at least 1 hour without increased pain for improved ability to have prior level of activity and increase fitness goals. Baseline:  Goal status: INITIAL   PLAN:  PT FREQUENCY:  2x/week  PT DURATION: 6 weeks  PLANNED INTERVENTIONS: 97110-Therapeutic exercises, 97530- Therapeutic activity, [id number]- Neuromuscular re-education, 97535- Self Care, [id number]- Manual therapy, [id number]- Canalith repositioning, [id number]- Aquatic Therapy, [id number]- Electrical stimulation (manual), [id number]- Vasopneumatic device, [id number]- Ultrasound, [id number]- Traction (mechanical), [id number]- Ionotophoresis 4mg /ml Dexamethasone, [id number] (  1-2 muscles), [id number] (3+ muscles)- Dry Needling, Patient/Family education, Balance training, Stair training, Taping, Joint mobilization, Joint manipulation, Spinal manipulation, Spinal mobilization, Scar mobilization, DME instructions, Cryotherapy, Moist heat, and Biofeedback.  PLAN FOR NEXT SESSION: assess updated HEP; increased strengthening, Cont core and posture strengthening and review/progress HEP prn, mobility mid back, cont manual therapy prn   Mliss Cummins, PT  12/10/23 11:02 AM [facility] [address], [address] [place], [place] [address] Phone # [phone] Fax [phone]

## 2023-12-10 ENCOUNTER — Encounter: Payer: Self-pay | Admitting: Physical Therapy

## 2023-12-10 ENCOUNTER — Ambulatory Visit: Admitting: Physical Therapy

## 2023-12-10 DIAGNOSIS — R293 Abnormal posture: Secondary | ICD-10-CM

## 2023-12-10 DIAGNOSIS — R252 Cramp and spasm: Secondary | ICD-10-CM

## 2023-12-10 DIAGNOSIS — M6281 Muscle weakness (generalized): Secondary | ICD-10-CM

## 2023-12-10 DIAGNOSIS — M5459 Other low back pain: Secondary | ICD-10-CM | POA: Diagnosis not present

## 2023-12-10 DIAGNOSIS — M62838 Other muscle spasm: Secondary | ICD-10-CM

## 2023-12-15 ENCOUNTER — Encounter: Payer: Self-pay | Admitting: Physical Therapy

## 2023-12-15 ENCOUNTER — Ambulatory Visit: Admitting: Physical Therapy

## 2023-12-15 DIAGNOSIS — M5459 Other low back pain: Secondary | ICD-10-CM | POA: Diagnosis not present

## 2023-12-15 DIAGNOSIS — M62838 Other muscle spasm: Secondary | ICD-10-CM

## 2023-12-15 DIAGNOSIS — R252 Cramp and spasm: Secondary | ICD-10-CM | POA: Diagnosis not present

## 2023-12-15 DIAGNOSIS — M6281 Muscle weakness (generalized): Secondary | ICD-10-CM | POA: Diagnosis not present

## 2023-12-15 DIAGNOSIS — R293 Abnormal posture: Secondary | ICD-10-CM

## 2023-12-15 NOTE — Therapy (Signed)
 OUTPATIENT PHYSICAL THERAPY THORACOLUMBAR TREATMENT   Patient Name: [name] [name] MRN: [id number] DOB:02-28-1975, 48 y.o., female Today's Date: 12/15/2023  END OF SESSION:  PT End of Session - 12/15/23 1320     Visit Number 10    Date for Recertification  02/02/24    Authorization Type [employer]    PT Start Time 1232    PT Stop Time 1316    PT Time Calculation (min) 44 min    Activity Tolerance Patient tolerated treatment well    Behavior During Therapy [facility] for tasks assessed/performed               Past Medical History:  Diagnosis Date   Allergy    Basal cell carcinoma    abdomen   Celiac disease 2009   Functional dyspepsia 09/05/2012   Gastritis    GERD (gastroesophageal reflux disease) 08/08/2012   Guillain Barr syndrome 04/01/2007   After influenza vaccine   Hyperthyroidism 04/01/2007   Hypoglycemia, unspecified 10/26/2007   Past Surgical History:  Procedure Laterality Date   CESAREAN SECTION     COLONOSCOPY  05/12/2007   Gessner   ESOPHAGOGASTRODUODENOSCOPY     SKIN CANCER EXCISION     abdomin   WISDOM TOOTH EXTRACTION     Patient Active Problem List   Diagnosis Date Noted   Nonsustained ventricular tachycardia (HCC) 08/24/2023   Vasovagal syncope 07/09/2023   Hashimoto's disease 07/07/2023   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   Small intestinal bacterial overgrowth 09/13/2022   Primary hypertension 06/04/2022   Preventative health care 05/07/2022   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   Fatigue 08/20/2021   Functional dyspepsia 09/05/2012   GERD (gastroesophageal reflux disease) 08/08/2012   Acne 08/08/2012   Hypoglycemia, unspecified 10/26/2007   Celiac disease 10/26/2007   Hyperthyroidism 04/01/2007   Guillain Barr syndrome 04/01/2007    PCP: Antonio Cyndee [name], DO  REFERRING PROVIDER: Antonio Cyndee [name],  DO  REFERRING DIAG: M54.9 (ICD-10-CM) - Mid back pain  Rationale for Evaluation and Treatment: Rehabilitation  THERAPY DIAG:  Other low back pain  Muscle weakness (generalized)  Abnormal posture  Cramp and spasm  Other muscle spasm  ONSET DATE: unsure specifically ~10/24/23    SUBJECTIVE:                                                                                                                                                                                           SUBJECTIVE STATEMENT: I am having increased low back pain today. I did not not anything out of the  ordinary this weekend. I felt really good over the weekend and went to the gym.  PERTINENT HISTORY:  Guillain Barr syndrome, Hyperthyroidism, Hashimoto's disease, CESAREAN SECTION, Celiac disease  PAIN:  Are you having pain? Yes NPRS scale: 1-2/10 Pain location: thoracic region Pain description: intermittent and aching  Aggravating factors: certain movements Relieving factors: stretching and rest   PRECAUTIONS: None  RED FLAGS: None   WEIGHT BEARING RESTRICTIONS: No  FALLS:  Has patient fallen in last 6 months? No  LIVING ENVIRONMENT: Lives with: lives with their family   OCCUPATION: desk job, works at home   PLOF: Independent  PATIENT GOALS: to have no pain with ending work day  NEXT MD VISIT: no   OBJECTIVE:  Note: Objective measures were completed at Evaluation unless otherwise noted.  DIAGNOSTIC FINDINGS:    PATIENT SURVEYS:  Eval: Oswestry Score: 11 / 50 or 22 %  COGNITION: Overall cognitive status: Within functional limits for tasks assessed     SENSATION: WFL  MUSCLE LENGTH: Bil hip flexors tight and adductors minimally   POSTURE: rounded shoulders and increased thoracic kyphosis  PALPATION: TTP and tightness noted in bil thoracic and lumbar paraspinals   LUMBAR ROM:   AROM eval  Flexion WFL  Extension Limited by 25%  Right lateral flexion WFL  Left lateral  flexion WFL  Right rotation Limited by 25%  Left rotation Limited by 25%   (Blank rows = not tested)  LOWER EXTREMITY ROM:     WFL  LOWER EXTREMITY MMT:    Bil hips grossly 4+/5   FUNCTIONAL TESTS:  Sit up test 2/3  GAIT: WFL  TREATMENT DATE:  12/15/2023: Updated on status and discussion of back pain Single knee to chest 2 x 20 sec bilateral  Hooklying hip internal/ external rotation  x8  Hooklying leg lengthener x 5 bilateral  PPT x 10 PPT + bent knee fallout x 10 bilateral  PPT + march x 10 bilateral  Supine glute stretch 2 x 20 sec bilateral  Cat Cow x 5  Tail Wag x 8    12/10/2023: UBE level 3 3/3 with PT present to discuss status Thoracic ext off head of bed with double ball at area of pain in T spine Seated MET for Tspine using isometric rhomboids Deadlift with 5# ea hand 2 x 10 Pallof press with green TB x 10 B  Bow and arrow green x 10  Y's at wall holding 1# DB 2 x 8 Assessed lat tightness with dowel - R tighter 90/90 lat stretch with dowel OH x 10 Lat Pulldown40# x 20 Lat hang for stretch  12/08/2023: UBE level 3 3/3 with PT present to discuss status Modified deadlift with green TB 2 x 10 Pallof press with green TB Bent over row 7# Y's at wall holding 1# DB 2 x 8 Lat Pulldown shoulder stretch x 2 Standing march with alt UE overhead press with 4# dumbbell x10 bilat and this same side raises x 10 reach SL shoulder ER 4# DB x 10 bilateral    12/03/2023: (pt arrived late for session) Nustep level 5 x4 min with PT present to discuss status Standing march with alt UE overhead press with 4# dumbbell 2x10 bilat Forward T with 4# dumbbell 2x10 bilat Supine with soft foam roll along spine:  snow angels x10 Supine with soft foam roll along spine: shoulder horizontal abduction with green tband 2x10 Standing shoulder abduction with green tband x10   PATIENT EDUCATION:  Education details: CONSTELLATION BRANDS Person  educated: Patient Education method:  Explanation, Demonstration, Tactile cues, Verbal cues, and Handouts Education comprehension: verbalized understanding, returned demonstration, verbal cues required, tactile cues required, and needs further education  HOME EXERCISE PROGRAM: Access Code: [email or web address] URL: [email or web address] Date: 12/15/2023 Prepared by: Kristeen Sar  Exercises - Cat Cow  - 1 x daily - 7 x weekly - 1 sets - 10 reps - Tail Wag  - 1 x daily - 7 x weekly - 1 sets - 10 reps - Child's Pose with Thread the Needle  - 1 x daily - 7 x weekly - 1 sets - 5 reps - 15s holds - Quadruped Full Range Thoracic Rotation with Reach  - 1 x daily - 7 x weekly - 1 sets - 10 reps - Sidelying Open Book Thoracic Lumbar Rotation and Extension  - 1 x daily - 7 x weekly - 1 sets - 10 reps - Supine Shoulder Horizontal Abduction with Resistance  - 2 x daily - 4-5 x weekly - 1-2 sets - 10 reps - 3 sec hold - Supine Shoulder External Rotation with Resistance  - 2 x daily - 4-5 x weekly - 1-2 sets - 10 reps - 3 sec hold - Supine narrow and wide grip flexion  - 4 x daily - 4-5 x weekly - 1-2 sets - 10 reps - 3 sec hold - Supine PNF D2   - 1 x daily - 4-5 x weekly - 1-2 sets - 10 reps - 3 sec hold - Forearm Walks on Wall with Resistance Band  - 1 x daily - 7 x weekly - 1-2 sets - 10 reps - Reverse Crunch  - 1 x daily - 7 x weekly - 2 sets - 10 reps - Prone Angels  - 1 x daily - 3 x weekly - 2 sets - 10 reps - Standing Serratus Punch with Resistance  - 1 x daily - 3 x weekly - 2 sets - 10 reps - Standing Single Arm Shoulder Abduction with Resistance  - 1 x daily - 7 x weekly - 2 sets - 10 reps - Standing March with Alternating Med [facility]  - 1 x daily - 7 x weekly - 2 sets - 10 reps - Forward T Arms Overhead with Band  - 1 x daily - 7 x weekly - 2 sets - 10 reps - Deadlift with Resistance  - 1 x daily - 7 x weekly - 1-2 sets - 10 reps - Standing Anti-Rotation Press with Anchored Resistance  - 1 x daily - 7 x weekly - 1-2 sets -  12 reps - Standing Bent Over Single Arm Shoulder Row  - 1 x daily - 7 x weekly - 1-2 sets - 12 reps - Low Trap Setting at Wall  - 1 x daily - 7 x weekly - 2 sets - 8-10 reps - Standing March with Alternating Med [facility]  - 1 x daily - 7 x weekly - 1-2 sets - 10 reps - Seated Cat Cow  - 1 x daily - 7 x weekly - 1 sets - 5-10 reps - Seated Thoracic Rotation: Open Book  - 1 x daily - 7 x weekly - 1 sets - 5 reps - Sidelying Shoulder External Rotation Dumbbell  - 1 x daily - 7 x weekly - 1-2 sets - 10 reps - Drawing Bow  - 1 x daily - 3 x weekly - 2 sets - 10 reps - Lat Pull Down  - 1 x  daily - 3 x weekly - 2 sets - 10 reps - Supine Single Knee to Chest Stretch  - 1 x daily - 7 x weekly - 2 sets - 20-30 hold - Supine Hip Internal and External Rotation  - 1 x daily - 7 x weekly - 1 sets - 8 reps - Supine Ankle Pumps  - 1 x daily - 7 x weekly - 1 sets - 5 reps - Supine Posterior Pelvic Tilt  - 1 x daily - 7 x weekly - 1 sets - 10 reps - Bent Knee Fallouts  - 1 x daily - 7 x weekly - 1 sets - 10 reps - Supine March  - 1 x daily - 7 x weekly - 1 sets - 10 reps  ASSESSMENT:  CLINICAL IMPRESSION:' Scottie presents with increased low back pain today. She is not sure what aggravated her pain. She has been feeling good and the pain has been improving. Educated patient on the importance of staying mobile during pain flares to help joint lubrication and bringing fluid to the area. Incorporated lumbar decompression exercises and gentle core activation techniques. Patient verbalized feeling some relief at the end of session. Updated HEP to include exercise progressions. Patient will benefit from skilled PT to address the below impairments and improve overall function.    OBJECTIVE IMPAIRMENTS: decreased activity tolerance, decreased coordination, decreased endurance, decreased mobility, decreased strength, increased fascial restrictions, increased muscle spasms, impaired flexibility, improper body  mechanics, postural dysfunction, and pain.   ACTIVITY LIMITATIONS: carrying, lifting, sitting, standing, and squatting  PARTICIPATION LIMITATIONS: meal prep, community activity, and occupation  PERSONAL FACTORS: 1 comorbidity: history of back pain are also affecting patient's functional outcome.   REHAB POTENTIAL: Good  CLINICAL DECISION MAKING: Stable/uncomplicated  EVALUATION COMPLEXITY: Low   GOALS: Goals reviewed with patient? Yes  SHORT TERM GOALS: Target date: 12/01/23  Pt to be I with HEP for carry over and continuing recommendations for improved outcomes.   Baseline: Goal status: MET 11/24/23  2.  Pt to report no more than 2/10 back pain for improved tolerance to sitting for work at least 2 hours.  Baseline:  Goal status: In progress  3.  Pt to demonstrate full ROM at thoracic spine for improved posture at midline and upright tolerance.  Baseline:  Goal status: In Progress   LONG TERM GOALS: Target date: 02/02/24  Pt to be I with advanced HEP for carry over and continuing recommendations for improved outcomes.   Baseline:  Goal status: INITIAL  2.  Pt to score no higher than 5/50 owestry indicating improvement in functional mobility with less to no pain.  Baseline:  Goal status: INITIAL  3.  Pt to report 0/10 back pain for improved tolerance to sitting for work at least 2 hours.   Baseline:  Goal status: INITIAL  4.  Pt to report ability to walk at least 1 hour without increased pain for improved ability to have prior level of activity and increase fitness goals. Baseline:  Goal status: INITIAL   PLAN:  PT FREQUENCY: 2x/week  PT DURATION: 6 weeks  PLANNED INTERVENTIONS: 97110-Therapeutic exercises, 97530- Therapeutic activity, [id number]- Neuromuscular re-education, 97535- Self Care, [id number]- Manual therapy, [id number]- Canalith repositioning, [id number]- Aquatic Therapy, [id number]- Electrical stimulation (manual), [id number]- Vasopneumatic device, [id number]- Ultrasound, [id number]-  Traction (mechanical), [id number]- Ionotophoresis 4mg /ml Dexamethasone, [id number] (1-2 muscles), [id number] (3+ muscles)- Dry Needling, Patient/Family education, Balance training, Stair training, Taping, Joint mobilization, Joint manipulation, Spinal manipulation, Spinal mobilization, Scar mobilization, DME instructions, Cryotherapy, Moist heat,  and Biofeedback.  PLAN FOR NEXT SESSION: assess back pain; increased strengthening, Cont core and posture strengthening and review/progress HEP prn, mobility mid back, cont manual therapy prn   Ryot Burrous, PT, DPT 12/15/23 1:20 PM [facility] [address], [address] [place], [place] [address] Phone # [phone] Fax [phone]

## 2023-12-17 ENCOUNTER — Ambulatory Visit: Admitting: Physical Therapy

## 2023-12-17 ENCOUNTER — Encounter: Payer: Self-pay | Admitting: Physical Therapy

## 2023-12-17 DIAGNOSIS — M5459 Other low back pain: Secondary | ICD-10-CM

## 2023-12-17 DIAGNOSIS — R293 Abnormal posture: Secondary | ICD-10-CM

## 2023-12-17 DIAGNOSIS — M6281 Muscle weakness (generalized): Secondary | ICD-10-CM | POA: Diagnosis not present

## 2023-12-17 DIAGNOSIS — R252 Cramp and spasm: Secondary | ICD-10-CM | POA: Diagnosis not present

## 2023-12-17 DIAGNOSIS — M62838 Other muscle spasm: Secondary | ICD-10-CM | POA: Diagnosis not present

## 2023-12-17 NOTE — Therapy (Signed)
 OUTPATIENT PHYSICAL THERAPY THORACOLUMBAR TREATMENT   Patient Name: [name] [name] MRN: [id number] DOB:11-Jun-1975, 48 y.o., female Today's Date: 12/17/2023  END OF SESSION:  PT End of Session - 12/17/23 1147     Visit Number 11    Date for Recertification  02/02/24    Authorization Type BCBS    PT Start Time 1102    PT Stop Time 1142    PT Time Calculation (min) 40 min    Activity Tolerance Patient tolerated treatment well    Behavior During Therapy [facility] for tasks assessed/performed                Past Medical History:  Diagnosis Date   Allergy    Basal cell carcinoma    abdomen   Celiac disease 2009   Functional dyspepsia 09/05/2012   Gastritis    GERD (gastroesophageal reflux disease) 08/08/2012   Guillain Barr syndrome 04/01/2007   After influenza vaccine   Hyperthyroidism 04/01/2007   Hypoglycemia, unspecified 10/26/2007   Past Surgical History:  Procedure Laterality Date   CESAREAN SECTION     COLONOSCOPY  05/12/2007   Gessner   ESOPHAGOGASTRODUODENOSCOPY     SKIN CANCER EXCISION     abdomin   WISDOM TOOTH EXTRACTION     Patient Active Problem List   Diagnosis Date Noted   Nonsustained ventricular tachycardia (HCC) 08/24/2023   Vasovagal syncope 07/09/2023   Hashimoto's disease 07/07/2023   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   Small intestinal bacterial overgrowth 09/13/2022   Primary hypertension 06/04/2022   Preventative health care 05/07/2022   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   Fatigue 08/20/2021   Functional dyspepsia 09/05/2012   GERD (gastroesophageal reflux disease) 08/08/2012   Acne 08/08/2012   Hypoglycemia, unspecified 10/26/2007   Celiac disease 10/26/2007   Hyperthyroidism 04/01/2007   Guillain Barr syndrome 04/01/2007    PCP: Antonio Cyndee [name], DO  REFERRING PROVIDER: Antonio Cyndee [name],  DO  REFERRING DIAG: M54.9 (ICD-10-CM) - Mid back pain  Rationale for Evaluation and Treatment: Rehabilitation  THERAPY DIAG:  Other low back pain  Muscle weakness (generalized)  Abnormal posture  Cramp and spasm  Other muscle spasm  ONSET DATE: unsure specifically ~10/24/23    SUBJECTIVE:                                                                                                                                                                                           SUBJECTIVE STATEMENT: Patient reports she is doing better today. She feels like last session helped her back  pain plateau quicker.   PERTINENT HISTORY:  Guillain Barr syndrome, Hyperthyroidism, Hashimoto's disease, CESAREAN SECTION, Celiac disease  PAIN:  Are you having pain? Yes NPRS scale: 1-2/10 Pain location: thoracic region Pain description: intermittent and aching  Aggravating factors: certain movements Relieving factors: stretching and rest   PRECAUTIONS: None  RED FLAGS: None   WEIGHT BEARING RESTRICTIONS: No  FALLS:  Has patient fallen in last 6 months? No  LIVING ENVIRONMENT: Lives with: lives with their family   OCCUPATION: desk job, works at home   PLOF: Independent  PATIENT GOALS: to have no pain with ending work day  NEXT MD VISIT: no   OBJECTIVE:  Note: Objective measures were completed at Evaluation unless otherwise noted.  DIAGNOSTIC FINDINGS:    PATIENT SURVEYS:  Eval: Oswestry Score: 11 / 50 or 22 %  COGNITION: Overall cognitive status: Within functional limits for tasks assessed     SENSATION: WFL  MUSCLE LENGTH: Bil hip flexors tight and adductors minimally   POSTURE: rounded shoulders and increased thoracic kyphosis  PALPATION: TTP and tightness noted in bil thoracic and lumbar paraspinals   LUMBAR ROM:   AROM eval  Flexion WFL  Extension Limited by 25%  Right lateral flexion WFL  Left lateral flexion WFL  Right rotation Limited by 25%  Left  rotation Limited by 25%   (Blank rows = not tested)  LOWER EXTREMITY ROM:     WFL  LOWER EXTREMITY MMT:    Bil hips grossly 4+/5   FUNCTIONAL TESTS:  Sit up test 2/3  GAIT: WFL  TREATMENT DATE:  12/17/2023: NuStep Level 4 - 6 mins- PT present to discuss status Hooklying diaphragmatic breathing x 5  Hooklying hip internal/ external rotation  x8  Hooklying leg lengthener x 5 bilateral  PPT x 10 PPT + march with red loop 2 x 10 PPT + SLR x 10 bilateral  Hooklying march + shoulder extension with PT anchoring green TB 2 x 10  90/90 toe tap  x 20  Quadruped TA activation x 10 (verbal and tactile cues) Quadruped TA activation + shoulder raise x 10 total  Quadruped TA activation + leg extension  x 10 total  Child's pose x 20 sec  Thread through child's pose x 4 each side 5 sec hold    12/15/2023: Updated on status and discussion of back pain Single knee to chest 2 x 20 sec bilateral  Hooklying hip internal/ external rotation  x8  Hooklying leg lengthener x 5 bilateral  PPT x 10 PPT + bent knee fallout x 10 bilateral  PPT + march x 10 bilateral  Supine glute stretch 2 x 20 sec bilateral  Cat Cow x 5  Tail Wag x 8    12/10/2023: UBE level 3 3/3 with PT present to discuss status Thoracic ext off head of bed with double ball at area of pain in T spine Seated MET for Tspine using isometric rhomboids Deadlift with 5# ea hand 2 x 10 Pallof press with green TB x 10 B  Bow and arrow green x 10  Y's at wall holding 1# DB 2 x 8 Assessed lat tightness with dowel - R tighter 90/90 lat stretch with dowel OH x 10 Lat Pulldown40# x 20 Lat hang for stretch  12/08/2023: UBE level 3 3/3 with PT present to discuss status Modified deadlift with green TB 2 x 10 Pallof press with green TB Bent over row 7# Y's at wall holding 1# DB 2 x 8 Lat Pulldown  shoulder stretch x 2 Standing march with alt UE overhead press with 4# dumbbell x10 bilat and this same side raises x 10  reach SL shoulder ER 4# DB x 10 bilateral     PATIENT EDUCATION:  Education details: PENSIONS CONSULTANT Person educated: Patient Education method: Explanation, Demonstration, Tactile cues, Verbal cues, and Handouts Education comprehension: verbalized understanding, returned demonstration, verbal cues required, tactile cues required, and needs further education  HOME EXERCISE PROGRAM: Access Code: [email or web address] URL: [email or web address] Date: 12/15/2023 Prepared by: Kristeen Sar  Exercises - Cat Cow  - 1 x daily - 7 x weekly - 1 sets - 10 reps - Tail Wag  - 1 x daily - 7 x weekly - 1 sets - 10 reps - Child's Pose with Thread the Needle  - 1 x daily - 7 x weekly - 1 sets - 5 reps - 15s holds - Quadruped Full Range Thoracic Rotation with Reach  - 1 x daily - 7 x weekly - 1 sets - 10 reps - Sidelying Open Book Thoracic Lumbar Rotation and Extension  - 1 x daily - 7 x weekly - 1 sets - 10 reps - Supine Shoulder Horizontal Abduction with Resistance  - 2 x daily - 4-5 x weekly - 1-2 sets - 10 reps - 3 sec hold - Supine Shoulder External Rotation with Resistance  - 2 x daily - 4-5 x weekly - 1-2 sets - 10 reps - 3 sec hold - Supine narrow and wide grip flexion  - 4 x daily - 4-5 x weekly - 1-2 sets - 10 reps - 3 sec hold - Supine PNF D2   - 1 x daily - 4-5 x weekly - 1-2 sets - 10 reps - 3 sec hold - Forearm Walks on Wall with Resistance Band  - 1 x daily - 7 x weekly - 1-2 sets - 10 reps - Reverse Crunch  - 1 x daily - 7 x weekly - 2 sets - 10 reps - Prone Angels  - 1 x daily - 3 x weekly - 2 sets - 10 reps - Standing Serratus Punch with Resistance  - 1 x daily - 3 x weekly - 2 sets - 10 reps - Standing Single Arm Shoulder Abduction with Resistance  - 1 x daily - 7 x weekly - 2 sets - 10 reps - Standing March with Alternating Med [facility]  - 1 x daily - 7 x weekly - 2 sets - 10 reps - Forward T Arms Overhead with Band  - 1 x daily - 7 x weekly - 2 sets - 10 reps - Deadlift with Resistance   - 1 x daily - 7 x weekly - 1-2 sets - 10 reps - Standing Anti-Rotation Press with Anchored Resistance  - 1 x daily - 7 x weekly - 1-2 sets - 12 reps - Standing Bent Over Single Arm Shoulder Row  - 1 x daily - 7 x weekly - 1-2 sets - 12 reps - Low Trap Setting at Wall  - 1 x daily - 7 x weekly - 2 sets - 8-10 reps - Standing March with Alternating Med [facility]  - 1 x daily - 7 x weekly - 1-2 sets - 10 reps - Seated Cat Cow  - 1 x daily - 7 x weekly - 1 sets - 5-10 reps - Seated Thoracic Rotation: Open Book  - 1 x daily - 7 x weekly - 1 sets - 5 reps - Sidelying Shoulder External  Rotation Dumbbell  - 1 x daily - 7 x weekly - 1-2 sets - 10 reps - Drawing Bow  - 1 x daily - 3 x weekly - 2 sets - 10 reps - Lat Pull Down  - 1 x daily - 3 x weekly - 2 sets - 10 reps - Supine Single Knee to Chest Stretch  - 1 x daily - 7 x weekly - 2 sets - 20-30 hold - Supine Hip Internal and External Rotation  - 1 x daily - 7 x weekly - 1 sets - 8 reps - Supine Ankle Pumps  - 1 x daily - 7 x weekly - 1 sets - 5 reps - Supine Posterior Pelvic Tilt  - 1 x daily - 7 x weekly - 1 sets - 10 reps - Bent Knee Fallouts  - 1 x daily - 7 x weekly - 1 sets - 10 reps - Supine March  - 1 x daily - 7 x weekly - 1 sets - 10 reps  ASSESSMENT:  CLINICAL IMPRESSION:' Scottie presents with less low back pain compared to last treatment session. After last session she feels her back pain levels were able to calm down sooner than normal. She tried a pilates class and she really enjoyed it and feels it can be helpful to her. Continued lower intensity exercises today, but patient was able to tolerate progressions in core exercises. She required occasional reminders for correct breathing techniques while performing exercises. Educated patient on what exercises to continue to maintain progress made. Patient will benefit from skilled PT to address the below impairments and improve overall function.     OBJECTIVE IMPAIRMENTS: decreased  activity tolerance, decreased coordination, decreased endurance, decreased mobility, decreased strength, increased fascial restrictions, increased muscle spasms, impaired flexibility, improper body mechanics, postural dysfunction, and pain.   ACTIVITY LIMITATIONS: carrying, lifting, sitting, standing, and squatting  PARTICIPATION LIMITATIONS: meal prep, community activity, and occupation  PERSONAL FACTORS: 1 comorbidity: history of back pain are also affecting patient's functional outcome.   REHAB POTENTIAL: Good  CLINICAL DECISION MAKING: Stable/uncomplicated  EVALUATION COMPLEXITY: Low   GOALS: Goals reviewed with patient? Yes  SHORT TERM GOALS: Target date: 12/01/23  Pt to be I with HEP for carry over and continuing recommendations for improved outcomes.   Baseline: Goal status: MET 11/24/23  2.  Pt to report no more than 2/10 back pain for improved tolerance to sitting for work at least 2 hours.  Baseline:  Goal status: In progress  3.  Pt to demonstrate full ROM at thoracic spine for improved posture at midline and upright tolerance.  Baseline:  Goal status: In Progress   LONG TERM GOALS: Target date: 02/02/24  Pt to be I with advanced HEP for carry over and continuing recommendations for improved outcomes.   Baseline:  Goal status: INITIAL  2.  Pt to score no higher than 5/50 owestry indicating improvement in functional mobility with less to no pain.  Baseline:  Goal status: INITIAL  3.  Pt to report 0/10 back pain for improved tolerance to sitting for work at least 2 hours.   Baseline:  Goal status: INITIAL  4.  Pt to report ability to walk at least 1 hour without increased pain for improved ability to have prior level of activity and increase fitness goals. Baseline:  Goal status: INITIAL   PLAN:  PT FREQUENCY: 2x/week  PT DURATION: 6 weeks  PLANNED INTERVENTIONS: 97110-Therapeutic exercises, 97530- Therapeutic activity, [id number]- Neuromuscular  re-education, 97535- Self Care,  02859- Manual therapy, [id number]- Canalith repositioning, [id number]- Aquatic Therapy, [id number]- Electrical stimulation (manual), [id number]- Vasopneumatic device, [id number]- Ultrasound, [id number]- Traction (mechanical), [id number]- Ionotophoresis 4mg /ml Dexamethasone, 79439 (1-2 muscles), [id number] (3+ muscles)- Dry Needling, Patient/Family education, Balance training, Stair training, Taping, Joint mobilization, Joint manipulation, Spinal manipulation, Spinal mobilization, Scar mobilization, DME instructions, Cryotherapy, Moist heat, and Biofeedback.  PLAN FOR NEXT SESSION: assess back pain; increased strengthening, Cont core and posture strengthening and review/progress HEP prn, mobility mid back, cont manual therapy prn   Shawnice Tilmon, PT, DPT 12/17/23 11:48 AM [facility] [address] [place], [place] [address] Phone # [phone] Fax [phone]

## 2023-12-20 ENCOUNTER — Encounter: Payer: Self-pay | Admitting: Radiology

## 2023-12-22 ENCOUNTER — Encounter: Payer: Self-pay | Admitting: Physical Therapy

## 2023-12-22 ENCOUNTER — Ambulatory Visit: Attending: Family Medicine | Admitting: Physical Therapy

## 2023-12-22 DIAGNOSIS — M62838 Other muscle spasm: Secondary | ICD-10-CM | POA: Insufficient documentation

## 2023-12-22 DIAGNOSIS — M5459 Other low back pain: Secondary | ICD-10-CM | POA: Insufficient documentation

## 2023-12-22 DIAGNOSIS — M6281 Muscle weakness (generalized): Secondary | ICD-10-CM | POA: Diagnosis not present

## 2023-12-22 DIAGNOSIS — R293 Abnormal posture: Secondary | ICD-10-CM | POA: Diagnosis not present

## 2023-12-22 DIAGNOSIS — R252 Cramp and spasm: Secondary | ICD-10-CM | POA: Insufficient documentation

## 2023-12-22 NOTE — Therapy (Signed)
 OUTPATIENT PHYSICAL THERAPY THORACOLUMBAR TREATMENT   Patient Name: [name] [name] MRN: [id number] DOB:09/16/1975, 48 y.o., female Today's Date: 12/22/2023  END OF SESSION:  PT End of Session - 12/22/23 1326     Visit Number 12    Date for Recertification  02/02/24    Authorization Type BCBS    PT Start Time 1233    PT Stop Time 1318    PT Time Calculation (min) 45 min    Activity Tolerance Patient tolerated treatment well    Behavior During Therapy [facility] for tasks assessed/performed                 Past Medical History:  Diagnosis Date   Allergy    Basal cell carcinoma    abdomen   Celiac disease 2009   Functional dyspepsia 09/05/2012   Gastritis    GERD (gastroesophageal reflux disease) 08/08/2012   Guillain Barr syndrome 04/01/2007   After influenza vaccine   Hyperthyroidism 04/01/2007   Hypoglycemia, unspecified 10/26/2007   Past Surgical History:  Procedure Laterality Date   CESAREAN SECTION     COLONOSCOPY  05/12/2007   Gessner   ESOPHAGOGASTRODUODENOSCOPY     SKIN CANCER EXCISION     abdomin   WISDOM TOOTH EXTRACTION     Patient Active Problem List   Diagnosis Date Noted   Nonsustained ventricular tachycardia (HCC) 08/24/2023   Vasovagal syncope 07/09/2023   Hashimoto's disease 07/07/2023   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   Small intestinal bacterial overgrowth 09/13/2022   Primary hypertension 06/04/2022   Preventative health care 05/07/2022   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   Fatigue 08/20/2021   Functional dyspepsia 09/05/2012   GERD (gastroesophageal reflux disease) 08/08/2012   Acne 08/08/2012   Hypoglycemia, unspecified 10/26/2007   Celiac disease 10/26/2007   Hyperthyroidism 04/01/2007   Guillain Barr syndrome 04/01/2007    PCP: Antonio Cyndee [name], DO  REFERRING PROVIDER: Antonio Cyndee [name],  DO  REFERRING DIAG: M54.9 (ICD-10-CM) - Mid back pain  Rationale for Evaluation and Treatment: Rehabilitation  THERAPY DIAG:  Other low back pain  Muscle weakness (generalized)  Abnormal posture  Cramp and spasm  Other muscle spasm  ONSET DATE: unsure specifically ~10/24/23    SUBJECTIVE:                                                                                                                                                                                           SUBJECTIVE STATEMENT: Patient reports she is feeling great today.  PERTINENT HISTORY:  Guillain Barr syndrome,  Hyperthyroidism, Hashimoto's disease, CESAREAN SECTION, Celiac disease  PAIN:  Are you having pain? Yes NPRS scale: 1-2/10 Pain location: thoracic region Pain description: intermittent and aching  Aggravating factors: certain movements Relieving factors: stretching and rest   PRECAUTIONS: None  RED FLAGS: None   WEIGHT BEARING RESTRICTIONS: No  FALLS:  Has patient fallen in last 6 months? No  LIVING ENVIRONMENT: Lives with: lives with their family   OCCUPATION: desk job, works at home   PLOF: Independent  PATIENT GOALS: to have no pain with ending work day  NEXT MD VISIT: no   OBJECTIVE:  Note: Objective measures were completed at Evaluation unless otherwise noted.  DIAGNOSTIC FINDINGS:    PATIENT SURVEYS:  Eval: Oswestry Score: 11 / 50 or 22 %  COGNITION: Overall cognitive status: Within functional limits for tasks assessed     SENSATION: WFL  MUSCLE LENGTH: Bil hip flexors tight and adductors minimally   POSTURE: rounded shoulders and increased thoracic kyphosis  PALPATION: TTP and tightness noted in bil thoracic and lumbar paraspinals   LUMBAR ROM:   AROM eval  Flexion WFL  Extension Limited by 25%  Right lateral flexion WFL  Left lateral flexion WFL  Right rotation Limited by 25%  Left rotation Limited by 25%   (Blank rows = not tested)  LOWER  EXTREMITY ROM:     WFL  LOWER EXTREMITY MMT:    Bil hips grossly 4+/5   FUNCTIONAL TESTS:  Sit up test 2/3  GAIT: WFL  TREATMENT DATE:  12/22/2023: NuStep Level 4 - 6 mins- PT present to discuss status Review of standing posture when cooking and seated posture when sitting at desk Modified deadlift with green TB x 10 Row with green TB under feet (patient in a bent over motion) 2 x 5 each bilateral  Plank at counter + cross march 2 x 5each 90/90 toe tap x  90/90 toe tap with 4# DB Bear Plank 2 x 10sec     12/17/2023: NuStep Level 4 - 6 mins- PT present to discuss status Hooklying diaphragmatic breathing x 5  Hooklying hip internal/ external rotation  x8  Hooklying leg lengthener x 5 bilateral  PPT x 10 PPT + march with red loop 2 x 10 PPT + SLR x 10 bilateral  Hooklying march + shoulder extension with PT anchoring green TB 2 x 10  90/90 toe tap  x 20  Quadruped TA activation x 10 (verbal and tactile cues) Quadruped TA activation + shoulder raise x 10 total  Quadruped TA activation + leg extension  x 10 total  Child's pose x 20 sec  Thread through child's pose x 4 each side 5 sec hold    12/15/2023: Updated on status and discussion of back pain Single knee to chest 2 x 20 sec bilateral  Hooklying hip internal/ external rotation  x8  Hooklying leg lengthener x 5 bilateral  PPT x 10 PPT + bent knee fallout x 10 bilateral  PPT + march x 10 bilateral  Supine glute stretch 2 x 20 sec bilateral  Cat Cow x 5  Tail Wag x 8    12/10/2023: UBE level 3 3/3 with PT present to discuss status Thoracic ext off head of bed with double ball at area of pain in T spine Seated MET for Tspine using isometric rhomboids Deadlift with 5# ea hand 2 x 10 Pallof press with green TB x 10 B  Bow and arrow green x 10  Y's at wall holding 1#  DB 2 x 8 Assessed lat tightness with dowel - R tighter 90/90 lat stretch with dowel OH x 10 Lat Pulldown40# x 20 Lat hang for  stretch     PATIENT EDUCATION:  Education details: PENSIONS CONSULTANT Person educated: Patient Education method: Explanation, Demonstration, Tactile cues, Verbal cues, and Handouts Education comprehension: verbalized understanding, returned demonstration, verbal cues required, tactile cues required, and needs further education  HOME EXERCISE PROGRAM: Access Code: [email or web address] URL: [email or web address] Date: 12/22/2023 Prepared by: Kristeen Sar  Exercises - Cat Cow  - 1 x daily - 7 x weekly - 1 sets - 10 reps - Tail Wag  - 1 x daily - 7 x weekly - 1 sets - 10 reps - Child's Pose with Thread the Needle  - 1 x daily - 7 x weekly - 1 sets - 5 reps - 15s holds - Quadruped Full Range Thoracic Rotation with Reach  - 1 x daily - 7 x weekly - 1 sets - 10 reps - Sidelying Open Book Thoracic Lumbar Rotation and Extension  - 1 x daily - 7 x weekly - 1 sets - 10 reps - Supine Shoulder Horizontal Abduction with Resistance  - 2 x daily - 4-5 x weekly - 1-2 sets - 10 reps - 3 sec hold - Supine Shoulder External Rotation with Resistance  - 2 x daily - 4-5 x weekly - 1-2 sets - 10 reps - 3 sec hold - Supine narrow and wide grip flexion  - 4 x daily - 4-5 x weekly - 1-2 sets - 10 reps - 3 sec hold - Supine PNF D2   - 1 x daily - 4-5 x weekly - 1-2 sets - 10 reps - 3 sec hold - Forearm Walks on Wall with Resistance Band  - 1 x daily - 7 x weekly - 1-2 sets - 10 reps - Reverse Crunch  - 1 x daily - 7 x weekly - 2 sets - 10 reps - [employer]  - 1 x daily - 3 x weekly - 2 sets - 10 reps - Standing Serratus Punch with Resistance  - 1 x daily - 3 x weekly - 2 sets - 10 reps - Standing Single Arm Shoulder Abduction with Resistance  - 1 x daily - 7 x weekly - 2 sets - 10 reps - Standing March with Alternating Med [facility]  - 1 x daily - 7 x weekly - 2 sets - 10 reps - Forward T Arms Overhead with Band  - 1 x daily - 7 x weekly - 2 sets - 10 reps - Deadlift with Resistance  - 1 x daily - 7 x weekly - 1-2  sets - 10 reps - Standing Anti-Rotation Press with Anchored Resistance  - 1 x daily - 7 x weekly - 1-2 sets - 12 reps - Standing Bent Over Single Arm Shoulder Row  - 1 x daily - 7 x weekly - 1-2 sets - 12 reps - Low Trap Setting at Wall  - 1 x daily - 7 x weekly - 2 sets - 8-10 reps - Standing March with Alternating Med [facility]  - 1 x daily - 7 x weekly - 1-2 sets - 10 reps - Seated Cat Cow  - 1 x daily - 7 x weekly - 1 sets - 5-10 reps - Seated Thoracic Rotation: Open Book  - 1 x daily - 7 x weekly - 1 sets - 5 reps - Sidelying Shoulder External Rotation Dumbbell  -  1 x daily - 7 x weekly - 1-2 sets - 10 reps - Drawing Bow  - 1 x daily - 3 x weekly - 2 sets - 10 reps - Lat Pull Down  - 1 x daily - 3 x weekly - 2 sets - 10 reps - Supine Single Knee to Chest Stretch  - 1 x daily - 7 x weekly - 2 sets - 20-30 hold - Supine Hip Internal and External Rotation  - 1 x daily - 7 x weekly - 1 sets - 8 reps - Supine Ankle Pumps  - 1 x daily - 7 x weekly - 1 sets - 5 reps - Supine Posterior Pelvic Tilt  - 1 x daily - 7 x weekly - 1 sets - 10 reps - Bent Knee Fallouts  - 1 x daily - 7 x weekly - 1 sets - 10 reps - Supine March  - 1 x daily - 7 x weekly - 1 sets - 10 reps - [employer] on Counter  - 1 x daily - 7 x weekly - 2 sets - 5 reps - Supine 90/90 Alternating Heel Touches with Posterior Pelvic Tilt  - 1 x daily - 7 x weekly - 2 sets - 10 reps  ASSESSMENT:  CLINICAL IMPRESSION:' Scottie is responding well with skilled therapy. Treatment session focused a lot on correct standing and sitting postures. Educate patient on correct alignment when sitting and having core engagement when she is standing to cook. Progressed core exercises today and she tolerated all of them well. She required frequent verbal cues for breathing technique while performing exercises. Patient's flare up of her back pain did not last as long as it normally does. Patient will benefit from skilled PT to address the below  impairments and improve overall function.      OBJECTIVE IMPAIRMENTS: decreased activity tolerance, decreased coordination, decreased endurance, decreased mobility, decreased strength, increased fascial restrictions, increased muscle spasms, impaired flexibility, improper body mechanics, postural dysfunction, and pain.   ACTIVITY LIMITATIONS: carrying, lifting, sitting, standing, and squatting  PARTICIPATION LIMITATIONS: meal prep, community activity, and occupation  PERSONAL FACTORS: 1 comorbidity: history of back pain are also affecting patient's functional outcome.   REHAB POTENTIAL: Good  CLINICAL DECISION MAKING: Stable/uncomplicated  EVALUATION COMPLEXITY: Low   GOALS: Goals reviewed with patient? Yes  SHORT TERM GOALS: Target date: 12/01/23  Pt to be I with HEP for carry over and continuing recommendations for improved outcomes.   Baseline: Goal status: MET 11/24/23  2.  Pt to report no more than 2/10 back pain for improved tolerance to sitting for work at least 2 hours.  Baseline:  Goal status: In progress  3.  Pt to demonstrate full ROM at thoracic spine for improved posture at midline and upright tolerance.  Baseline:  Goal status: In Progress   LONG TERM GOALS: Target date: 02/02/24  Pt to be I with advanced HEP for carry over and continuing recommendations for improved outcomes.   Baseline:  Goal status: INITIAL  2.  Pt to score no higher than 5/50 owestry indicating improvement in functional mobility with less to no pain.  Baseline:  Goal status: INITIAL  3.  Pt to report 0/10 back pain for improved tolerance to sitting for work at least 2 hours.   Baseline:  Goal status: INITIAL  4.  Pt to report ability to walk at least 1 hour without increased pain for improved ability to have prior level of activity and increase fitness goals. Baseline:  Goal status: INITIAL   PLAN:  PT FREQUENCY: 2x/week  PT DURATION: 6 weeks  PLANNED INTERVENTIONS:  97110-Therapeutic exercises, 97530- Therapeutic activity, [id number]- Neuromuscular re-education, 97535- Self Care, [id number]- Manual therapy, [phone]- Canalith repositioning, [id number]- Aquatic Therapy, [phone]- Electrical stimulation (manual), [id number]- Vasopneumatic device, [id number]- Ultrasound, [id number]- Traction (mechanical), [id number]- Ionotophoresis 4mg /ml Dexamethasone, [id number] (1-2 muscles), [id number] (3+ muscles)- Dry Needling, Patient/Family education, Balance training, Stair training, Taping, Joint mobilization, Joint manipulation, Spinal manipulation, Spinal mobilization, Scar mobilization, DME instructions, Cryotherapy, Moist heat, and Biofeedback.  PLAN FOR NEXT SESSION: continue progressing core and thoracic strengthening exercises. Cont core and posture strengthening and review/progress HEP prn, mobility mid back, cont manual therapy prn   Martyn Timme, PT, DPT 12/22/23 1:27 PM [facility] [address], [address] [place], [place] [address] Phone # [phone] Fax [phone]

## 2023-12-24 ENCOUNTER — Encounter: Payer: Self-pay | Admitting: Physical Therapy

## 2023-12-24 ENCOUNTER — Ambulatory Visit: Admitting: Physical Therapy

## 2023-12-24 DIAGNOSIS — M6281 Muscle weakness (generalized): Secondary | ICD-10-CM

## 2023-12-24 DIAGNOSIS — R293 Abnormal posture: Secondary | ICD-10-CM

## 2023-12-24 DIAGNOSIS — R252 Cramp and spasm: Secondary | ICD-10-CM

## 2023-12-24 DIAGNOSIS — M5459 Other low back pain: Secondary | ICD-10-CM

## 2023-12-24 DIAGNOSIS — M62838 Other muscle spasm: Secondary | ICD-10-CM | POA: Diagnosis not present

## 2023-12-24 NOTE — Therapy (Signed)
 OUTPATIENT PHYSICAL THERAPY THORACOLUMBAR TREATMENT   Patient Name: [name] [name] MRN: [id number] DOB:1975-11-19, 48 y.o., female Today's Date: 12/24/2023  END OF SESSION:  PT End of Session - 12/24/23 1152     Visit Number 13    Date for Recertification  02/02/24    Authorization Type BCBS    PT Start Time 1102    PT Stop Time 1146    PT Time Calculation (min) 44 min    Activity Tolerance Patient tolerated treatment well    Behavior During Therapy [facility] for tasks assessed/performed                  Past Medical History:  Diagnosis Date   Allergy    Basal cell carcinoma    abdomen   Celiac disease 2009   Functional dyspepsia 09/05/2012   Gastritis    GERD (gastroesophageal reflux disease) 08/08/2012   Guillain Barr syndrome 04/01/2007   After influenza vaccine   Hyperthyroidism 04/01/2007   Hypoglycemia, unspecified 10/26/2007   Past Surgical History:  Procedure Laterality Date   CESAREAN SECTION     COLONOSCOPY  05/12/2007   Gessner   ESOPHAGOGASTRODUODENOSCOPY     SKIN CANCER EXCISION     abdomin   WISDOM TOOTH EXTRACTION     Patient Active Problem List   Diagnosis Date Noted   Nonsustained ventricular tachycardia (HCC) 08/24/2023   Vasovagal syncope 07/09/2023   Hashimoto's disease 07/07/2023   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   Small intestinal bacterial overgrowth 09/13/2022   Primary hypertension 06/04/2022   Preventative health care 05/07/2022   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   Fatigue 08/20/2021   Functional dyspepsia 09/05/2012   GERD (gastroesophageal reflux disease) 08/08/2012   Acne 08/08/2012   Hypoglycemia, unspecified 10/26/2007   Celiac disease 10/26/2007   Hyperthyroidism 04/01/2007   Guillain Barr syndrome 04/01/2007    PCP: Antonio Cyndee [name], DO  REFERRING PROVIDER: Antonio Cyndee [name],  DO  REFERRING DIAG: M54.9 (ICD-10-CM) - Mid back pain  Rationale for Evaluation and Treatment: Rehabilitation  THERAPY DIAG:  Other low back pain  Muscle weakness (generalized)  Abnormal posture  Cramp and spasm  ONSET DATE: unsure specifically ~10/24/23    SUBJECTIVE:                                                                                                                                                                                           SUBJECTIVE STATEMENT: I'm doing great. Still have that baseline arthritis pain.   PERTINENT HISTORY:  Guillain Barr syndrome,  Hyperthyroidism, Hashimoto's disease, CESAREAN SECTION, Celiac disease  PAIN:  Are you having pain? Yes NPRS scale: 1-2/10 Pain location: thoracic region Pain description: intermittent and aching  Aggravating factors: certain movements Relieving factors: stretching and rest   PRECAUTIONS: None  RED FLAGS: None   WEIGHT BEARING RESTRICTIONS: No  FALLS:  Has patient fallen in last 6 months? No  LIVING ENVIRONMENT: Lives with: lives with their family   OCCUPATION: desk job, works at home   PLOF: Independent  PATIENT GOALS: to have no pain with ending work day  NEXT MD VISIT: no   OBJECTIVE:  Note: Objective measures were completed at Evaluation unless otherwise noted.  DIAGNOSTIC FINDINGS:    PATIENT SURVEYS:  Eval: Oswestry Score: 11 / 50 or 22 %  COGNITION: Overall cognitive status: Within functional limits for tasks assessed     SENSATION: WFL  MUSCLE LENGTH: Bil hip flexors tight and adductors minimally   POSTURE: rounded shoulders and increased thoracic kyphosis  PALPATION: TTP and tightness noted in bil thoracic and lumbar paraspinals   LUMBAR ROM:   AROM eval  Flexion WFL  Extension Limited by 25%  Right lateral flexion WFL  Left lateral flexion WFL  Right rotation Limited by 25%  Left rotation Limited by 25%   (Blank rows = not tested)  LOWER EXTREMITY  ROM:     WFL  LOWER EXTREMITY MMT:    Bil hips grossly 4+/5   FUNCTIONAL TESTS:  Sit up test 2/3  GAIT: WFL  TREATMENT DATE:  12/24/2023: NuStep Level 5 - 6 mins- PT present to discuss status Modified deadlift with green TB x 10 Row with green TB under feet (patient in a bent over motion) x 10   Plank at counter + cross march 1x10 Plank with throacic rotation x 10 B (like open book), also did at mat table with feet turning as well x 10 90/90 toe tap with 4# DB x 10 Bear Plank 1x30 sec, then with alt foot lift multiple reps Lat Pulldown40# x 20 Hang from lat bar Discussed increasing endurance to walking Quadruped fire hydrant x 5 B TC and VC Quadruped hip ext 2x5 R, 1x5 L (challenging on R side)  12/22/2023: NuStep Level 4 - 6 mins- PT present to discuss status Review of standing posture when cooking and seated posture when sitting at desk Modified deadlift with green TB x 10 Row with green TB under feet (patient in a bent over motion) 2 x 5 each bilateral  Plank at counter + cross march 2 x 5each 90/90 toe tap x  90/90 toe tap with 4# DB Bear Plank 2 x 10sec     12/17/2023: NuStep Level 4 - 6 mins- PT present to discuss status Hooklying diaphragmatic breathing x 5  Hooklying hip internal/ external rotation  x8  Hooklying leg lengthener x 5 bilateral  PPT x 10 PPT + march with red loop 2 x 10 PPT + SLR x 10 bilateral  Hooklying march + shoulder extension with PT anchoring green TB 2 x 10  90/90 toe tap  x 20  Quadruped TA activation x 10 (verbal and tactile cues) Quadruped TA activation + shoulder raise x 10 total  Quadruped TA activation + leg extension  x 10 total  Child's pose x 20 sec  Thread through child's pose x 4 each side 5 sec hold    12/15/2023: Updated on status and discussion of back pain Single knee to chest 2 x 20 sec bilateral  Hooklying  hip internal/ external rotation  x8  Hooklying leg lengthener x 5 bilateral  PPT x 10 PPT + bent knee  fallout x 10 bilateral  PPT + march x 10 bilateral  Supine glute stretch 2 x 20 sec bilateral  Cat Cow x 5  Tail Wag x 8    12/10/2023: UBE level 3 3/3 with PT present to discuss status Thoracic ext off head of bed with double ball at area of pain in T spine Seated MET for Tspine using isometric rhomboids Deadlift with 5# ea hand 2 x 10 Pallof press with green TB x 10 B  Bow and arrow green x 10  Y's at wall holding 1# DB 2 x 8 Assessed lat tightness with dowel - R tighter 90/90 lat stretch with dowel OH x 10 Lat Pulldown40# x 20 Lat hang for stretch     PATIENT EDUCATION:  Education details: PENSIONS CONSULTANT Person educated: Patient Education method: Programmer, Multimedia, Demonstration, Actor cues, Verbal cues, and Handouts Education comprehension: verbalized understanding, returned demonstration, verbal cues required, tactile cues required, and needs further education  HOME EXERCISE PROGRAM: Access Code: [email or web address] URL: [email or web address] Date: 12/22/2023 Prepared by: Kristeen Sar  Exercises - Cat Cow  - 1 x daily - 7 x weekly - 1 sets - 10 reps - Tail Wag  - 1 x daily - 7 x weekly - 1 sets - 10 reps - Child's Pose with Thread the Needle  - 1 x daily - 7 x weekly - 1 sets - 5 reps - 15s holds - Quadruped Full Range Thoracic Rotation with Reach  - 1 x daily - 7 x weekly - 1 sets - 10 reps - Sidelying Open Book Thoracic Lumbar Rotation and Extension  - 1 x daily - 7 x weekly - 1 sets - 10 reps - Supine Shoulder Horizontal Abduction with Resistance  - 2 x daily - 4-5 x weekly - 1-2 sets - 10 reps - 3 sec hold - Supine Shoulder External Rotation with Resistance  - 2 x daily - 4-5 x weekly - 1-2 sets - 10 reps - 3 sec hold - Supine narrow and wide grip flexion  - 4 x daily - 4-5 x weekly - 1-2 sets - 10 reps - 3 sec hold - Supine PNF D2   - 1 x daily - 4-5 x weekly - 1-2 sets - 10 reps - 3 sec hold - Forearm Walks on Wall with Resistance Band  - 1 x daily - 7 x weekly - 1-2  sets - 10 reps - Reverse Crunch  - 1 x daily - 7 x weekly - 2 sets - 10 reps - [employer]  - 1 x daily - 3 x weekly - 2 sets - 10 reps - Standing Serratus Punch with Resistance  - 1 x daily - 3 x weekly - 2 sets - 10 reps - Standing Single Arm Shoulder Abduction with Resistance  - 1 x daily - 7 x weekly - 2 sets - 10 reps - Standing March with Alternating Med [facility]  - 1 x daily - 7 x weekly - 2 sets - 10 reps - Forward T Arms Overhead with Band  - 1 x daily - 7 x weekly - 2 sets - 10 reps - Deadlift with Resistance  - 1 x daily - 7 x weekly - 1-2 sets - 10 reps - Standing Anti-Rotation Press with Anchored Resistance  - 1 x daily - 7 x weekly - 1-2 sets -  12 reps - Standing Bent Over Single Arm Shoulder Row  - 1 x daily - 7 x weekly - 1-2 sets - 12 reps - Low Trap Setting at Wall  - 1 x daily - 7 x weekly - 2 sets - 8-10 reps - Standing March with Alternating Med [facility]  - 1 x daily - 7 x weekly - 1-2 sets - 10 reps - Seated Cat Cow  - 1 x daily - 7 x weekly - 1 sets - 5-10 reps - Seated Thoracic Rotation: Open Book  - 1 x daily - 7 x weekly - 1 sets - 5 reps - Sidelying Shoulder External Rotation Dumbbell  - 1 x daily - 7 x weekly - 1-2 sets - 10 reps - Drawing Bow  - 1 x daily - 3 x weekly - 2 sets - 10 reps - Lat Pull Down  - 1 x daily - 3 x weekly - 2 sets - 10 reps - Supine Single Knee to Chest Stretch  - 1 x daily - 7 x weekly - 2 sets - 20-30 hold - Supine Hip Internal and External Rotation  - 1 x daily - 7 x weekly - 1 sets - 8 reps - Supine Ankle Pumps  - 1 x daily - 7 x weekly - 1 sets - 5 reps - Supine Posterior Pelvic Tilt  - 1 x daily - 7 x weekly - 1 sets - 10 reps - Bent Knee Fallouts  - 1 x daily - 7 x weekly - 1 sets - 10 reps - Supine March  - 1 x daily - 7 x weekly - 1 sets - 10 reps - [employer] on Counter  - 1 x daily - 7 x weekly - 2 sets - 5 reps - Supine 90/90 Alternating Heel Touches with Posterior Pelvic Tilt  - 1 x daily - 7 x weekly - 2 sets - 10  reps  ASSESSMENT:  CLINICAL IMPRESSION:' Patient continues to report improvements overall. She feels she has a certain level of exercise she can tolerate and then beyond that she gets a flare up. We progressed some of her HEP today with modifications to challenge her more. Patient reports she felt really good at end of session. She is interested in any exercises that might be good for lymphatic drainage.       OBJECTIVE IMPAIRMENTS: decreased activity tolerance, decreased coordination, decreased endurance, decreased mobility, decreased strength, increased fascial restrictions, increased muscle spasms, impaired flexibility, improper body mechanics, postural dysfunction, and pain.   ACTIVITY LIMITATIONS: carrying, lifting, sitting, standing, and squatting  PARTICIPATION LIMITATIONS: meal prep, community activity, and occupation  PERSONAL FACTORS: 1 comorbidity: history of back pain are also affecting patient's functional outcome.   REHAB POTENTIAL: Good  CLINICAL DECISION MAKING: Stable/uncomplicated  EVALUATION COMPLEXITY: Low   GOALS: Goals reviewed with patient? Yes  SHORT TERM GOALS: Target date: 12/01/23  Pt to be I with HEP for carry over and continuing recommendations for improved outcomes.   Baseline: Goal status: MET 11/24/23  2.  Pt to report no more than 2/10 back pain for improved tolerance to sitting for work at least 2 hours.  Baseline:  Goal status: In progress  3.  Pt to demonstrate full ROM at thoracic spine for improved posture at midline and upright tolerance.  Baseline:  Goal status: MET 11/7   LONG TERM GOALS: Target date: 02/02/24  Pt to be I with advanced HEP for carry over and continuing recommendations for  improved outcomes.   Baseline:  Goal status: INITIAL  2.  Pt to score no higher than 5/50 owestry indicating improvement in functional mobility with less to no pain.  Baseline:  Goal status: INITIAL  3.  Pt to report 0/10 back pain for  improved tolerance to sitting for work at least 2 hours.   Baseline:  Goal status: INITIAL  4.  Pt to report ability to walk at least 1 hour without increased pain for improved ability to have prior level of activity and increase fitness goals. Baseline:  Goal status: IN PROGRESS 11/7    PLAN:  PT FREQUENCY: 2x/week  PT DURATION: 6 weeks  PLANNED INTERVENTIONS: 97110-Therapeutic exercises, 97530- Therapeutic activity, 97112- Neuromuscular re-education, 97535- Self Care, [id number]- Manual therapy, [phone]- Canalith repositioning, [id number]- Aquatic Therapy, [phone]- Electrical stimulation (manual), [id number]- Vasopneumatic device, [id number]- Ultrasound, [id number]- Traction (mechanical), [id number]- Ionotophoresis 4mg /ml Dexamethasone, [id number] (1-2 muscles), [id number] (3+ muscles)- Dry Needling, Patient/Family education, Balance training, Stair training, Taping, Joint mobilization, Joint manipulation, Spinal manipulation, Spinal mobilization, Scar mobilization, DME instructions, Cryotherapy, Moist heat, and Biofeedback.  PLAN FOR NEXT SESSION: continue progressing core and thoracic strengthening exercises. Cont core and posture strengthening and review/progress HEP prn, mobility mid back, cont manual therapy prn  Mliss Cummins, PT  12/24/23 11:54 AM [facility] [address] [place], [place] [address] Phone # [phone] Fax [phone]

## 2023-12-27 ENCOUNTER — Other Ambulatory Visit: Payer: Self-pay | Admitting: Medical Genetics

## 2023-12-27 DIAGNOSIS — Z006 Encounter for examination for normal comparison and control in clinical research program: Secondary | ICD-10-CM

## 2023-12-29 ENCOUNTER — Encounter: Payer: Self-pay | Admitting: Physical Therapy

## 2023-12-29 ENCOUNTER — Ambulatory Visit: Admitting: Physical Therapy

## 2023-12-29 DIAGNOSIS — R293 Abnormal posture: Secondary | ICD-10-CM

## 2023-12-29 DIAGNOSIS — R252 Cramp and spasm: Secondary | ICD-10-CM

## 2023-12-29 DIAGNOSIS — M62838 Other muscle spasm: Secondary | ICD-10-CM

## 2023-12-29 DIAGNOSIS — M5459 Other low back pain: Secondary | ICD-10-CM

## 2023-12-29 DIAGNOSIS — M6281 Muscle weakness (generalized): Secondary | ICD-10-CM

## 2023-12-29 NOTE — Therapy (Signed)
 OUTPATIENT PHYSICAL THERAPY THORACOLUMBAR TREATMENT   Patient Name: [name] [name] MRN: [id number] DOB:05/20/1975, 48 y.o., female Today's Date: 12/29/2023  END OF SESSION:  PT End of Session - 12/29/23 1319     Visit Number 14    Date for Recertification  02/02/24    Authorization Type BCBS    PT Start Time 1231    PT Stop Time 1318    PT Time Calculation (min) 47 min    Activity Tolerance Patient tolerated treatment well    Behavior During Therapy [facility] for tasks assessed/performed                   Past Medical History:  Diagnosis Date   Allergy    Basal cell carcinoma    abdomen   Celiac disease 2009   Functional dyspepsia 09/05/2012   Gastritis    GERD (gastroesophageal reflux disease) 08/08/2012   Guillain Barr syndrome 04/01/2007   After influenza vaccine   Hyperthyroidism 04/01/2007   Hypoglycemia, unspecified 10/26/2007   Past Surgical History:  Procedure Laterality Date   CESAREAN SECTION     COLONOSCOPY  05/12/2007   Gessner   ESOPHAGOGASTRODUODENOSCOPY     SKIN CANCER EXCISION     abdomin   WISDOM TOOTH EXTRACTION     Patient Active Problem List   Diagnosis Date Noted   Nonsustained ventricular tachycardia (HCC) 08/24/2023   Vasovagal syncope 07/09/2023   Hashimoto's disease 07/07/2023   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   Small intestinal bacterial overgrowth 09/13/2022   Primary hypertension 06/04/2022   Preventative health care 05/07/2022   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   Fatigue 08/20/2021   Functional dyspepsia 09/05/2012   GERD (gastroesophageal reflux disease) 08/08/2012   Acne 08/08/2012   Hypoglycemia, unspecified 10/26/2007   Celiac disease 10/26/2007   Hyperthyroidism 04/01/2007   Guillain Barr syndrome 04/01/2007    PCP: Antonio Cyndee [name], DO  REFERRING PROVIDER: Antonio Cyndee [name],  DO  REFERRING DIAG: M54.9 (ICD-10-CM) - Mid back pain  Rationale for Evaluation and Treatment: Rehabilitation  THERAPY DIAG:  Other low back pain  Muscle weakness (generalized)  Abnormal posture  Cramp and spasm  Other muscle spasm  ONSET DATE: unsure specifically ~10/24/23    SUBJECTIVE:                                                                                                                                                                                           SUBJECTIVE STATEMENT: I went on a hike this weekend and I had increased soreness/  discomfort. But it felt good.  PERTINENT HISTORY:  Guillain Barr syndrome, Hyperthyroidism, Hashimoto's disease, CESAREAN SECTION, Celiac disease  PAIN:  Are you having pain? Yes NPRS scale: 1-2/10 Pain location: thoracic region Pain description: intermittent and aching  Aggravating factors: certain movements Relieving factors: stretching and rest   PRECAUTIONS: None  RED FLAGS: None   WEIGHT BEARING RESTRICTIONS: No  FALLS:  Has patient fallen in last 6 months? No  LIVING ENVIRONMENT: Lives with: lives with their family   OCCUPATION: desk job, works at home   PLOF: Independent  PATIENT GOALS: to have no pain with ending work day  NEXT MD VISIT: no   OBJECTIVE:  Note: Objective measures were completed at Evaluation unless otherwise noted.  DIAGNOSTIC FINDINGS:    PATIENT SURVEYS:  Eval: Oswestry Score: 11 / 50 or 22 %  COGNITION: Overall cognitive status: Within functional limits for tasks assessed     SENSATION: WFL  MUSCLE LENGTH: Bil hip flexors tight and adductors minimally   POSTURE: rounded shoulders and increased thoracic kyphosis  PALPATION: TTP and tightness noted in bil thoracic and lumbar paraspinals   LUMBAR ROM:   AROM eval  Flexion WFL  Extension Limited by 25%  Right lateral flexion WFL  Left lateral flexion WFL  Right rotation Limited by 25%  Left rotation Limited by  25%   (Blank rows = not tested)  LOWER EXTREMITY ROM:     WFL  LOWER EXTREMITY MMT:    Bil hips grossly 4+/5   FUNCTIONAL TESTS:  Sit up test 2/3  GAIT: WFL  TREATMENT DATE:  12/29/2023: NuStep Level 5 - 6 mins- PT present to discuss status Hamstring stretch on power plate 30Hz  2 x 30 sec bilateral  Hip flexor/ QL stretch on power plate 30Hz  x 30 sec bilateral  Plank with thoracic rotation x 10 B (off of plinth) Bear Plank 1x30 sec, then with alt foot lift multiple reps 90/90 toe tap with 6# DB over head x 20 total 90/90 position + single leg SLR x 5 each Bend over row (supported on plinth) 6 # DB x 10 bilateral  Bent over T (supported in plinth) 6# DB x 10 bilateral  Quadruped hip ext 2x5 bilateral (tactile cues for proper form to prevent compensations) Modified deadlift with green TB x 10 Lat Pulldown40# x 20 Hang from lat bar      12/24/2023: NuStep Level 5 - 6 mins- PT present to discuss status Modified deadlift with green TB x 10 Row with green TB under feet (patient in a bent over motion) x 10   Plank at counter + cross march 1x10 Plank with throacic rotation x 10 B (like open book), also did at mat table with feet turning as well x 10 90/90 toe tap with 4# DB x 10 Bear Plank 1x30 sec, then with alt foot lift multiple reps Lat Pulldown40# x 20 Hang from lat bar Discussed increasing endurance to walking Quadruped fire hydrant x 5 B TC and VC Quadruped hip ext 2x5 R, 1x5 L (challenging on R side)  12/22/2023: NuStep Level 4 - 6 mins- PT present to discuss status Review of standing posture when cooking and seated posture when sitting at desk Modified deadlift with green TB x 10 Row with green TB under feet (patient in a bent over motion) 2 x 5 each bilateral  Plank at counter + cross march 2 x 5each 90/90 toe tap x  90/90 toe tap with 4# DB Bear Plank 2 x  10sec     12/17/2023: NuStep Level 4 - 6 mins- PT present to discuss status Hooklying  diaphragmatic breathing x 5  Hooklying hip internal/ external rotation  x8  Hooklying leg lengthener x 5 bilateral  PPT x 10 PPT + march with red loop 2 x 10 PPT + SLR x 10 bilateral  Hooklying march + shoulder extension with PT anchoring green TB 2 x 10  90/90 toe tap  x 20  Quadruped TA activation x 10 (verbal and tactile cues) Quadruped TA activation + shoulder raise x 10 total  Quadruped TA activation + leg extension  x 10 total  Child's pose x 20 sec  Thread through child's pose x 4 each side 5 sec hold    PATIENT EDUCATION:  Education details: PENSIONS CONSULTANT Person educated: Patient Education method: Programmer, Multimedia, Demonstration, Actor cues, Verbal cues, and Handouts Education comprehension: verbalized understanding, returned demonstration, verbal cues required, tactile cues required, and needs further education  HOME EXERCISE PROGRAM: Access Code: [email or web address] URL: [email or web address] Date: 12/22/2023 Prepared by: Kristeen Sar  Exercises - Cat Cow  - 1 x daily - 7 x weekly - 1 sets - 10 reps - Tail Wag  - 1 x daily - 7 x weekly - 1 sets - 10 reps - Child's Pose with Thread the Needle  - 1 x daily - 7 x weekly - 1 sets - 5 reps - 15s holds - Quadruped Full Range Thoracic Rotation with Reach  - 1 x daily - 7 x weekly - 1 sets - 10 reps - Sidelying Open Book Thoracic Lumbar Rotation and Extension  - 1 x daily - 7 x weekly - 1 sets - 10 reps - Supine Shoulder Horizontal Abduction with Resistance  - 2 x daily - 4-5 x weekly - 1-2 sets - 10 reps - 3 sec hold - Supine Shoulder External Rotation with Resistance  - 2 x daily - 4-5 x weekly - 1-2 sets - 10 reps - 3 sec hold - Supine narrow and wide grip flexion  - 4 x daily - 4-5 x weekly - 1-2 sets - 10 reps - 3 sec hold - Supine PNF D2   - 1 x daily - 4-5 x weekly - 1-2 sets - 10 reps - 3 sec hold - Forearm Walks on Wall with Resistance Band  - 1 x daily - 7 x weekly - 1-2 sets - 10 reps - Reverse Crunch  - 1 x daily - 7 x  weekly - 2 sets - 10 reps - Prone Angels  - 1 x daily - 3 x weekly - 2 sets - 10 reps - Standing Serratus Punch with Resistance  - 1 x daily - 3 x weekly - 2 sets - 10 reps - Standing Single Arm Shoulder Abduction with Resistance  - 1 x daily - 7 x weekly - 2 sets - 10 reps - Standing March with Alternating [facility]  - 1 x daily - 7 x weekly - 2 sets - 10 reps - Forward T Arms Overhead with Band  - 1 x daily - 7 x weekly - 2 sets - 10 reps - Deadlift with Resistance  - 1 x daily - 7 x weekly - 1-2 sets - 10 reps - Standing Anti-Rotation Press with Anchored Resistance  - 1 x daily - 7 x weekly - 1-2 sets - 12 reps - Standing Bent Over Single Arm Shoulder Row  - 1 x daily - 7 x weekly - 1-2  sets - 12 reps - Low Trap Setting at Wall  - 1 x daily - 7 x weekly - 2 sets - 8-10 reps - Standing March with Alternating [facility]  - 1 x daily - 7 x weekly - 1-2 sets - 10 reps - Seated Cat Cow  - 1 x daily - 7 x weekly - 1 sets - 5-10 reps - Seated Thoracic Rotation: Open Book  - 1 x daily - 7 x weekly - 1 sets - 5 reps - Sidelying Shoulder External Rotation Dumbbell  - 1 x daily - 7 x weekly - 1-2 sets - 10 reps - Drawing Bow  - 1 x daily - 3 x weekly - 2 sets - 10 reps - Lat Pull Down  - 1 x daily - 3 x weekly - 2 sets - 10 reps - Supine Single Knee to Chest Stretch  - 1 x daily - 7 x weekly - 2 sets - 20-30 hold - Supine Hip Internal and External Rotation  - 1 x daily - 7 x weekly - 1 sets - 8 reps - Supine Ankle Pumps  - 1 x daily - 7 x weekly - 1 sets - 5 reps - Supine Posterior Pelvic Tilt  - 1 x daily - 7 x weekly - 1 sets - 10 reps - Bent Knee Fallouts  - 1 x daily - 7 x weekly - 1 sets - 10 reps - Supine March  - 1 x daily - 7 x weekly - 1 sets - 10 reps - [employer] on Counter  - 1 x daily - 7 x weekly - 2 sets - 5 reps - Supine 90/90 Alternating Heel Touches with Posterior Pelvic Tilt  - 1 x daily - 7 x weekly - 2 sets - 10 reps  ASSESSMENT:  CLINICAL IMPRESSION:' Patient  went on a hike this weekend and that went really well. She verbalized having increased soreness that somewhat felt uncomfortable. She has been enjoying incorporating pilates into her routine. Progressed her core exercises today which was a good challenge. Quadruped hip extension continues to be a good challenge for lumbar stability. When performing hip extension on left patient required tactile cues for proper alignment.  Patient demonstrates good rehab potential to achieve stated goals through skilled therapy intervention.        OBJECTIVE IMPAIRMENTS: decreased activity tolerance, decreased coordination, decreased endurance, decreased mobility, decreased strength, increased fascial restrictions, increased muscle spasms, impaired flexibility, improper body mechanics, postural dysfunction, and pain.   ACTIVITY LIMITATIONS: carrying, lifting, sitting, standing, and squatting  PARTICIPATION LIMITATIONS: meal prep, community activity, and occupation  PERSONAL FACTORS: 1 comorbidity: history of back pain are also affecting patient's functional outcome.   REHAB POTENTIAL: Good  CLINICAL DECISION MAKING: Stable/uncomplicated  EVALUATION COMPLEXITY: Low   GOALS: Goals reviewed with patient? Yes  SHORT TERM GOALS: Target date: 12/01/23  Pt to be I with HEP for carry over and continuing recommendations for improved outcomes.   Baseline: Goal status: MET 11/24/23  2.  Pt to report no more than 2/10 back pain for improved tolerance to sitting for work at least 2 hours.  Baseline:  Goal status: In progress  3.  Pt to demonstrate full ROM at thoracic spine for improved posture at midline and upright tolerance.  Baseline:  Goal status: MET 11/7   LONG TERM GOALS: Target date: 02/02/24  Pt to be I with advanced HEP for carry over and continuing recommendations for improved outcomes.  Baseline:  Goal status: INITIAL  2.  Pt to score no higher than 5/50 owestry indicating improvement in  functional mobility with less to no pain.  Baseline:  Goal status: INITIAL  3.  Pt to report 0/10 back pain for improved tolerance to sitting for work at least 2 hours.   Baseline:  Goal status: INITIAL  4.  Pt to report ability to walk at least 1 hour without increased pain for improved ability to have prior level of activity and increase fitness goals. Baseline:  Goal status: IN PROGRESS 11/7    PLAN:  PT FREQUENCY: 2x/week  PT DURATION: 6 weeks  PLANNED INTERVENTIONS: 97110-Therapeutic exercises, 97530- Therapeutic activity, 97112- Neuromuscular re-education, 97535- Self Care, [id number]- Manual therapy, [phone]- Canalith repositioning, [id number]- Aquatic Therapy, [phone]- Electrical stimulation (manual), [id number]- Vasopneumatic device, [id number]- Ultrasound, [id number]- Traction (mechanical), [id number]- Ionotophoresis 4mg /ml Dexamethasone, [id number] (1-2 muscles), [id number] (3+ muscles)- Dry Needling, Patient/Family education, Balance training, Stair training, Taping, Joint mobilization, Joint manipulation, Spinal manipulation, Spinal mobilization, Scar mobilization, DME instructions, Cryotherapy, Moist heat, and Biofeedback.  PLAN FOR NEXT SESSION: continue progressing core and thoracic strengthening exercises. Cont core and posture strengthening and review/progress HEP prn, mobility mid back, cont manual therapy prn  Kionte Baumgardner, PT, DPT 12/29/23 1:20 PM [facility] [address] [place], [place] [address] Phone # [phone] Fax [phone]

## 2023-12-31 ENCOUNTER — Encounter: Payer: Self-pay | Admitting: Physical Therapy

## 2023-12-31 ENCOUNTER — Ambulatory Visit: Admitting: Physical Therapy

## 2023-12-31 DIAGNOSIS — M6281 Muscle weakness (generalized): Secondary | ICD-10-CM | POA: Diagnosis not present

## 2023-12-31 DIAGNOSIS — M5459 Other low back pain: Secondary | ICD-10-CM | POA: Diagnosis not present

## 2023-12-31 DIAGNOSIS — R252 Cramp and spasm: Secondary | ICD-10-CM

## 2023-12-31 DIAGNOSIS — M62838 Other muscle spasm: Secondary | ICD-10-CM

## 2023-12-31 DIAGNOSIS — R293 Abnormal posture: Secondary | ICD-10-CM

## 2023-12-31 NOTE — Therapy (Signed)
 OUTPATIENT PHYSICAL THERAPY THORACOLUMBAR TREATMENT   Patient Name: [name] [name] MRN: [id number] DOB:[birth year], 48 y.o., female Today's Date: 12/31/2023  END OF SESSION:  PT End of Session - 12/31/23 1148     Visit Number 15    Date for Recertification  02/02/24    Authorization Type BCBS    PT Start Time 1100    PT Stop Time 1143    PT Time Calculation (min) 43 min    Activity Tolerance Patient tolerated treatment well    Behavior During Therapy [facility] for tasks assessed/performed                    Past Medical History:  Diagnosis Date   Allergy    Basal cell carcinoma    abdomen   Celiac disease 2009   Functional dyspepsia 09/05/2012   Gastritis    GERD (gastroesophageal reflux disease) 08/08/2012   Guillain Barr syndrome 04/01/2007   After influenza vaccine   Hyperthyroidism 04/01/2007   Hypoglycemia, unspecified 10/26/2007   Past Surgical History:  Procedure Laterality Date   CESAREAN SECTION     COLONOSCOPY  05/12/2007   [facility]   ESOPHAGOGASTRODUODENOSCOPY     SKIN CANCER EXCISION     abdomin   WISDOM TOOTH EXTRACTION     Patient Active Problem List   Diagnosis Date Noted   Nonsustained ventricular tachycardia (HCC) 08/24/2023   Vasovagal syncope 07/09/2023   Hashimoto's disease 07/07/2023   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   Small intestinal bacterial overgrowth 09/13/2022   Primary hypertension 06/04/2022   Preventative health care 05/07/2022   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   Fatigue 08/20/2021   Functional dyspepsia 09/05/2012   GERD (gastroesophageal reflux disease) 08/08/2012   Acne 08/08/2012   Hypoglycemia, unspecified 10/26/2007   Celiac disease 10/26/2007   Hyperthyroidism 04/01/2007   Guillain Barr syndrome 04/01/2007    PCP: Antonio Cyndee [name], DO  REFERRING PROVIDER: Antonio Cyndee [name],  DO  REFERRING DIAG: M54.9 (ICD-10-CM) - Mid back pain  Rationale for Evaluation and Treatment: Rehabilitation  THERAPY DIAG:  Other low back pain  Muscle weakness (generalized)  Abnormal posture  Cramp and spasm  Other muscle spasm  ONSET DATE: unsure specifically ~10/24/23    SUBJECTIVE:                                                                                                                                                                                           SUBJECTIVE STATEMENT: I had increased muscle spasms on Wednesday night but I took  a muscle relaxer and went to sleep on it felt better. Currently no upper back pain but she is aware of her low back discomfort.  PERTINENT HISTORY:  Guillain Barr syndrome, Hyperthyroidism, Hashimoto's disease, CESAREAN SECTION, Celiac disease  PAIN:  Are you having pain? Yes NPRS scale: 1-2/10 Pain location: thoracic region Pain description: intermittent and aching  Aggravating factors: certain movements Relieving factors: stretching and rest   PRECAUTIONS: None  RED FLAGS: None   WEIGHT BEARING RESTRICTIONS: No  FALLS:  Has patient fallen in last 6 months? No  LIVING ENVIRONMENT: Lives with: lives with their family   OCCUPATION: desk job, works at home   PLOF: Independent  PATIENT GOALS: to have no pain with ending work day  NEXT MD VISIT: no   OBJECTIVE:  Note: Objective measures were completed at Evaluation unless otherwise noted.  DIAGNOSTIC FINDINGS:    PATIENT SURVEYS:  Eval: Oswestry Score: 11 / 50 or 22 %  COGNITION: Overall cognitive status: Within functional limits for tasks assessed     SENSATION: WFL  MUSCLE LENGTH: Bil hip flexors tight and adductors minimally   POSTURE: rounded shoulders and increased thoracic kyphosis  PALPATION: TTP and tightness noted in bil thoracic and lumbar paraspinals   LUMBAR ROM:   AROM eval  Flexion WFL  Extension Limited by 25%  Right lateral  flexion WFL  Left lateral flexion WFL  Right rotation Limited by 25%  Left rotation Limited by 25%   (Blank rows = not tested)  LOWER EXTREMITY ROM:     WFL  LOWER EXTREMITY MMT:    Bil hips grossly 4+/5   FUNCTIONAL TESTS:  Sit up test 2/3  GAIT: WFL  TREATMENT DATE:  12/31/2023: NuStep Level 5 - 6 mins- PT present to discuss status Hooklying 6# DB overhead TA activation + march x 20 90/90 toe tap with 6# DB over head x 20 total 90/90 position + single leg SLR x 5 each Supine Bridge 2 x 10 Quadruped hip ext 2x5 bilateral (still very challenging when stability on right to stay aligned)  Bottoms up 5# KB press 2 x 8 bilateral  Seated matrix 25# 2 x 10 Standing open book with green TB x 10 each side Single leg RDL 6# 2 x 8 bilateral (max cues) Hang from lat bar     12/29/2023: NuStep Level 5 - 6 mins- PT present to discuss status Hamstring stretch on power plate 30Hz  2 x 30 sec bilateral  Hip flexor/ QL stretch on power plate 30Hz  x 30 sec bilateral  Plank with thoracic rotation x 10 B (off of plinth) Bear Plank 1x30 sec, then with alt foot lift multiple reps 90/90 toe tap with 6# DB over head x 20 total 90/90 position + single leg SLR x 5 each Bend over row (supported on plinth) 6 # DB x 10 bilateral  Bent over T (supported in plinth) 6# DB x 10 bilateral  Quadruped hip ext 2x5 bilateral (tactile cues for proper form to prevent compensations) Modified deadlift with green TB x 10 Lat Pulldown40# x 20 Hang from lat bar      12/24/2023: NuStep Level 5 - 6 mins- PT present to discuss status Modified deadlift with green TB x 10 Row with green TB under feet (patient in a bent over motion) x 10   Plank at counter + cross march 1x10 Plank with throacic rotation x 10 B (like open book), also did at mat table with feet turning as well x 10  90/90 toe tap with 4# DB x 10 Bear Plank 1x30 sec, then with alt foot lift multiple reps Lat Pulldown40# x 20 Hang from lat  bar Discussed increasing endurance to walking Quadruped fire hydrant x 5 B TC and VC Quadruped hip ext 2x5 R, 1x5 L (challenging on R side)    PATIENT EDUCATION:  Education details: PENSIONS CONSULTANT Person educated: Patient Education method: Explanation, Demonstration, Tactile cues, Verbal cues, and Handouts Education comprehension: verbalized understanding, returned demonstration, verbal cues required, tactile cues required, and needs further education  HOME EXERCISE PROGRAM: Access Code: [email or web address] URL: [email or web address] Date: 12/31/2023 Prepared by: Kristeen Sar  Exercises - Cat Cow  - 1 x daily - 7 x weekly - 1 sets - 10 reps - Tail Wag  - 1 x daily - 7 x weekly - 1 sets - 10 reps - Child's Pose with Thread the Needle  - 1 x daily - 7 x weekly - 1 sets - 5 reps - 15s holds - Quadruped Full Range Thoracic Rotation with Reach  - 1 x daily - 7 x weekly - 1 sets - 10 reps - Sidelying Open Book Thoracic Lumbar Rotation and Extension  - 1 x daily - 7 x weekly - 1 sets - 10 reps - Supine Shoulder Horizontal Abduction with Resistance  - 2 x daily - 4-5 x weekly - 1-2 sets - 10 reps - 3 sec hold - Supine Shoulder External Rotation with Resistance  - 2 x daily - 4-5 x weekly - 1-2 sets - 10 reps - 3 sec hold - Supine narrow and wide grip flexion  - 4 x daily - 4-5 x weekly - 1-2 sets - 10 reps - 3 sec hold - Supine PNF D2   - 1 x daily - 4-5 x weekly - 1-2 sets - 10 reps - 3 sec hold - Forearm Walks on Wall with Resistance Band  - 1 x daily - 7 x weekly - 1-2 sets - 10 reps - Reverse Crunch  - 1 x daily - 7 x weekly - 2 sets - 10 reps - Prone Angels  - 1 x daily - 3 x weekly - 2 sets - 10 reps - Standing Serratus Punch with Resistance  - 1 x daily - 3 x weekly - 2 sets - 10 reps - Standing Single Arm Shoulder Abduction with Resistance  - 1 x daily - 7 x weekly - 2 sets - 10 reps - Standing March with Alternating Med [facility]  - 1 x daily - 7 x weekly - 2 sets - 10 reps - Forward  T Arms Overhead with Band  - 1 x daily - 7 x weekly - 2 sets - 10 reps - Deadlift with Resistance  - 1 x daily - 7 x weekly - 1-2 sets - 10 reps - Standing Anti-Rotation Press with Anchored Resistance  - 1 x daily - 7 x weekly - 1-2 sets - 12 reps - Standing Bent Over Single Arm Shoulder Row  - 1 x daily - 7 x weekly - 1-2 sets - 12 reps - Low Trap Setting at Wall  - 1 x daily - 7 x weekly - 2 sets - 8-10 reps - Standing March with Alternating Med [facility]  - 1 x daily - 7 x weekly - 1-2 sets - 10 reps - Seated Cat Cow  - 1 x daily - 7 x weekly - 1 sets - 5-10 reps - Seated Thoracic Rotation: Open Book  -  1 x daily - 7 x weekly - 1 sets - 5 reps - Sidelying Shoulder External Rotation Dumbbell  - 1 x daily - 7 x weekly - 1-2 sets - 10 reps - Drawing Bow  - 1 x daily - 3 x weekly - 2 sets - 10 reps - Lat Pull Down  - 1 x daily - 3 x weekly - 2 sets - 10 reps - Supine Single Knee to Chest Stretch  - 1 x daily - 7 x weekly - 2 sets - 20-30 hold - Supine Hip Internal and External Rotation  - 1 x daily - 7 x weekly - 1 sets - 8 reps - Supine Ankle Pumps  - 1 x daily - 7 x weekly - 1 sets - 5 reps - Supine Posterior Pelvic Tilt  - 1 x daily - 7 x weekly - 1 sets - 10 reps - Bent Knee Fallouts  - 1 x daily - 7 x weekly - 1 sets - 10 reps - Supine March  - 1 x daily - 7 x weekly - 1 sets - 10 reps - [employer] on Counter  - 1 x daily - 7 x weekly - 2 sets - 5 reps - Supine 90/90 Alternating Heel Touches with Posterior Pelvic Tilt  - 1 x daily - 7 x weekly - 2 sets - 10 reps - Standing Thoracic Open Book at Wall  - 1 x daily - 7 x weekly - 1 sets - 10 reps - Single Leg Deadlift with Kettlebell  - 1 x daily - 7 x weekly - 3 sets - 10 reps - Seated Row Cable Machine  - 1 x daily - 7 x weekly - 2 sets - 10 reps  ASSESSMENT:  CLINICAL IMPRESSION:' Patient presents with no current thoracic pain but she is aware of discomfort in her lumbar spine. She verbalized having increased muscle spasms on  Wednesday that responded well to a muscle relaxer and rest. Quadruped hip extension when stabilizing on the right continues to be a challenge for patient. PT provided tactile cues to prevent over compensation. Updated HEP to include exercise progressions. Patient verbalized feeling an challenge with today's exercises. PT monitored patient response and provided cues as needed.  Patient demonstrates good rehab potential to achieve stated goals through skilled therapy intervention.          OBJECTIVE IMPAIRMENTS: decreased activity tolerance, decreased coordination, decreased endurance, decreased mobility, decreased strength, increased fascial restrictions, increased muscle spasms, impaired flexibility, improper body mechanics, postural dysfunction, and pain.   ACTIVITY LIMITATIONS: carrying, lifting, sitting, standing, and squatting  PARTICIPATION LIMITATIONS: meal prep, community activity, and occupation  PERSONAL FACTORS: 1 comorbidity: history of back pain are also affecting patient's functional outcome.   REHAB POTENTIAL: Good  CLINICAL DECISION MAKING: Stable/uncomplicated  EVALUATION COMPLEXITY: Low   GOALS: Goals reviewed with patient? Yes  SHORT TERM GOALS: Target date: 12/01/23  Pt to be I with HEP for carry over and continuing recommendations for improved outcomes.   Baseline: Goal status: MET 11/24/23  2.  Pt to report no more than 2/10 back pain for improved tolerance to sitting for work at least 2 hours.  Baseline:  Goal status: In progress  3.  Pt to demonstrate full ROM at thoracic spine for improved posture at midline and upright tolerance.  Baseline:  Goal status: MET 11/7   LONG TERM GOALS: Target date: 02/02/24  Pt to be I with advanced HEP for carry over and continuing recommendations  for improved outcomes.   Baseline:  Goal status: INITIAL  2.  Pt to score no higher than 5/50 owestry indicating improvement in functional mobility with less to no pain.   Baseline:  Goal status: INITIAL  3.  Pt to report 0/10 back pain for improved tolerance to sitting for work at least 2 hours.   Baseline:  Goal status: INITIAL  4.  Pt to report ability to walk at least 1 hour without increased pain for improved ability to have prior level of activity and increase fitness goals. Baseline:  Goal status: IN PROGRESS 11/7    PLAN:  PT FREQUENCY: 2x/week  PT DURATION: 6 weeks  PLANNED INTERVENTIONS: 97110-Therapeutic exercises, 97530- Therapeutic activity, 97112- Neuromuscular re-education, 97535- Self Care, [id number]- Manual therapy, [phone]- Canalith repositioning, [id number]- Aquatic Therapy, [phone]- Electrical stimulation (manual), [id number]- Vasopneumatic device, [id number]- Ultrasound, [id number]- Traction (mechanical), [id number]- Ionotophoresis 4mg /ml Dexamethasone, [id number] (1-2 muscles), [id number] (3+ muscles)- Dry Needling, Patient/Family education, Balance training, Stair training, Taping, Joint mobilization, Joint manipulation, Spinal manipulation, Spinal mobilization, Scar mobilization, DME instructions, Cryotherapy, Moist heat, and Biofeedback.  PLAN FOR NEXT SESSION: see how she is doing; assess low back and thoracic pain; continue progressing as tolerated; manual/ DN as indicated   Kristeen Sar, PT, DPT 12/31/23 11:52 AM [facility] [address], [address] [place], [place] [address] Phone # [phone] Fax [phone]

## 2024-01-05 DIAGNOSIS — M9905 Segmental and somatic dysfunction of pelvic region: Secondary | ICD-10-CM | POA: Diagnosis not present

## 2024-01-05 DIAGNOSIS — M9903 Segmental and somatic dysfunction of lumbar region: Secondary | ICD-10-CM | POA: Diagnosis not present

## 2024-01-05 DIAGNOSIS — M9901 Segmental and somatic dysfunction of cervical region: Secondary | ICD-10-CM | POA: Diagnosis not present

## 2024-01-05 DIAGNOSIS — M9902 Segmental and somatic dysfunction of thoracic region: Secondary | ICD-10-CM | POA: Diagnosis not present

## 2024-01-11 ENCOUNTER — Encounter: Payer: Self-pay | Admitting: Allergy and Immunology

## 2024-01-11 ENCOUNTER — Ambulatory Visit: Admitting: Allergy and Immunology

## 2024-01-11 VITALS — BP 132/84 | HR 83 | Temp 98.8°F | Ht 67.5 in | Wt 126.1 lb

## 2024-01-11 DIAGNOSIS — G901 Familial dysautonomia [Riley-Day]: Secondary | ICD-10-CM | POA: Diagnosis not present

## 2024-01-11 DIAGNOSIS — Z87898 Personal history of other specified conditions: Secondary | ICD-10-CM | POA: Diagnosis not present

## 2024-01-11 DIAGNOSIS — M791 Myalgia, unspecified site: Secondary | ICD-10-CM | POA: Diagnosis not present

## 2024-01-11 DIAGNOSIS — T782XXD Anaphylactic shock, unspecified, subsequent encounter: Secondary | ICD-10-CM

## 2024-01-11 DIAGNOSIS — M6281 Muscle weakness (generalized): Secondary | ICD-10-CM

## 2024-01-11 DIAGNOSIS — G4720 Circadian rhythm sleep disorder, unspecified type: Secondary | ICD-10-CM

## 2024-01-11 DIAGNOSIS — T782XXA Anaphylactic shock, unspecified, initial encounter: Secondary | ICD-10-CM

## 2024-01-11 NOTE — Progress Notes (Unsigned)
 [place] - [place] - [place]   Dear Krasowski,  Thank you for referring BRANDEY VANDALEN to the [facility] [place]  on 01/11/2024.   Below is a summation of this patient's evaluation and recommendations.  Thank you for your referral. I will keep you informed about this patient's response to treatment.   If you have any questions please do not hesitate to contact me.   Sincerely,  Camellia DOROTHA Denis, MD Allergy / Immunology [place] [place]    ______________________________________________________________________    NEW PATIENT NOTE  Referring Provider: Bernie [name] PARAS, MD Primary Provider: Antonio Meth, Jamee SAUNDERS, DO Date of office visit: 01/11/2024    Subjective:   Chief Complaint:  [name] [name] (DOB: 1975/08/26) is a 48 y.o. female who presents to the clinic on 01/11/2024 with a chief complaint of Establish Care (Pt is very concerned with her immune system and the several medical problems she has been diagnosed with in the past 18 months.) .     HPI: Scottie presents to this clinic in evaluation of multiple issues.  First, she has been having recurrent episodes of fainting.  For the past 15 months she appears to have a recurrent episodes with a frequency about once every 3 months and maybe a little bit more frequently recently.  She usually has a prodrome of having some skin and hand and feet itching or tingling and that she will get some darkness of her vision and some nausea and some slurred speech and then she can faint and actually lose consciousness for possibly a minute.  If she can recognize this issue and quickly get into Trendelenburg position she does not faint and the episodes will pass.  Her recovery time is 10 to 30 minutes before she is back to normal although some of these episodes really wipe her out.  Provoking factors may include exposure to  elevated temperatures such as being in the shower and being outdoors in heat but there are times when there is not a physical trigger.  She does not have a headache with these episodes.  She does not have a rash with these episodes.  She has had thorough evaluation with cardiology and neurology and no obvious etiologic factor can be identified giving rise to these issues.  Second, she has this systemic pain/inflammation syndrome with joint aches and muscle aches and spasms of the muscles in her back.  She feels weak.  She does attempt to exercise about 5 times per week but she is not gaining any strength.  If she exercises to a very high extent then she has muscle soreness that can go on for a week or so.  Third, she has very fractured sleep.  She drinks 3 teas in the morning and then chocolate in the evening and infrequently has some alcohol.  Fourth, she has a history of high blood pressure that was diagnosed in the spring 2024 at which point in time she started losartan .  Fifth, she is seeing an integration medicine doctor who is diagnosed her with multiple food allergies and mold toxicity.  In the summer 2024 she did visit with a gastroenterologist for weight loss of 28 pounds and apparently had gastritis and diverticulitis and small bowel bacterial overgrowth and she had manipulation of her diet and this is actually much better.  And she had a history of chronic diarrhea but this is also much better with  change of her diet.  At this point she has no diarrhea.  Sixth, she has a history of allergic rhinitis for which she takes loratadine which clears up this issue.  Seventh, she has a distant history of Guillain-Barr.  She has a history of celiac disease.  Past Medical History:  Diagnosis Date  . Allergy   . Basal cell carcinoma    abdomen  . Celiac disease 2009  . Functional dyspepsia 09/05/2012  . Gastritis   . GERD (gastroesophageal reflux disease) 08/08/2012  . Guillain Barr syndrome  04/01/2007   After influenza vaccine  . Hyperthyroidism 04/01/2007  . Hypoglycemia, unspecified 10/26/2007    Past Surgical History:  Procedure Laterality Date  . CESAREAN SECTION    . COLONOSCOPY  05/12/2007   [place]  . ESOPHAGOGASTRODUODENOSCOPY    . SKIN CANCER EXCISION     abdomin  . WISDOM TOOTH EXTRACTION      Allergies as of 01/11/2024       Reactions   Banana Diarrhea   Gluten Meal Other (See Comments)   Influenza Vac Split Quad    REACTION: pt had Guillane Barre--- can not have flu shot   Molds & Smuts Swelling   Effects immune system   Peanut (diagnostic) Diarrhea   Tilactase         Medication List    Claritin 10 MG Caps Generic drug: Loratadine   CORTISOL MANAGER PO Take by mouth.   cyclobenzaprine  10 MG tablet Commonly known as: FLEXERIL  Take 1 tablet (10 mg total) by mouth 3 (three) times daily as needed for muscle spasms.   DIGESTIVE ENZYMES   glucosamine-chondroitin 500-400 MG tablet Take 1 tablet by mouth 3 (three) times daily.   Iron 325 (65 Fe) MG Tabs Take 65 mg by mouth daily.   losartan  50 MG tablet Commonly known as: COZAAR  Take 1 tablet (50 mg total) by mouth daily.   NON FORMULARY Take 1 tablet by mouth at bedtime. CBD   NON FORMULARY Place under the tongue daily. Biest 50/50 to 1 tablet   PROGESTERONE  MICRONIZED (PROGESTINS)   RX SUPPORT HB/REFLUX/ALOE PO Take by mouth.   UP4 PROBIOTICS PO    Review of systems negative except as noted in HPI / PMHx or noted below:  Review of Systems  Constitutional: Negative.   HENT: Negative.    Eyes: Negative.   Respiratory: Negative.    Cardiovascular: Negative.   Gastrointestinal: Negative.   Genitourinary: Negative.   Musculoskeletal: Negative.   Skin: Negative.   Neurological: Negative.   Endo/Heme/Allergies: Negative.   Psychiatric/Behavioral: Negative.      Family History  Problem Relation Age of Onset  . Leukemia Mother 35  . Hypertension Mother   . Stroke  Mother   . Memory loss Mother   . Hypertension Father   . Hyperlipidemia Father   . Diabetes Father   . Prostate cancer Father 23  . Depression Father   . Heart attack Father   . Hypertension Sister   . Bipolar disorder Sister   . Stroke Maternal Grandmother   . Multiple sclerosis Cousin   . Lupus Cousin   . Hypertension Other        siblings  . Autoimmune disease Other        numerous family members on her mother's side  . Colon cancer Neg Hx   . Esophageal cancer Neg Hx   . Stomach cancer Neg Hx   . Rectal cancer Neg Hx   . Colon polyps Neg  Hx     Social History   Socioeconomic History  . Marital status: Married    Spouse name: [name]  . Number of children: 2  . Years of education: 16  . Highest education level: Bachelor's degree (e.g., BA, AB, BS)  Occupational History  . Occupation: Social Worker: [employer]  Tobacco Use  . Smoking status: Former    Current packs/day: 0.00    Types: Cigarettes    Quit date: 01/19/1998    Years since quitting: 25.9  . Smokeless tobacco: Never  . Tobacco comments:    About age 59   Vaping Use  . Vaping status: Never Used  Substance and Sexual Activity  . Alcohol use: Yes    Alcohol/week: 4.0 - 8.0 standard drinks of alcohol    Types: 4 - 8 Glasses of wine per week  . Drug use: Yes    Comment: CBD  . Sexual activity: Yes    Partners: Male  Other Topics Concern  . 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 Difficulty of Paying Living Expenses: Not hard at all  Food Insecurity: No Food Insecurity (05/17/2023)   Hunger Vital Sign   . Worried About  Programme Researcher, Broadcasting/film/video in the Last Year: Never true   . Ran Out of Food in the Last Year: Never true  Transportation Needs: No Transportation Needs (05/17/2023)   PRAPARE - Transportation   . Lack of Transportation (Medical): No   . Lack of Transportation (Non-Medical): No  Physical Activity: Sufficiently Active (05/17/2023)   Exercise Vital Sign   . Days of Exercise per Week: 5 days   . Minutes of Exercise per Session: 40 min  Stress: Stress Concern Present (05/17/2023)   [employer] of Occupational Health - Occupational Stress Questionnaire   . Feeling of Stress : Very much  Social Connections: Moderately Isolated (05/17/2023)   Social Connection and Isolation Panel   . Frequency of Communication with Friends and Family: More than three times a week   . Frequency of Social Gatherings with Friends and Family: Once a week   . Attends Religious Services: Never   . Active Member of Clubs or Organizations: No   . Attends Banker Meetings: Not on file   . Marital Status: Married  Catering Manager Violence: Not on Scientist, Research (life Sciences) and Social history  Lives in a house with a dry environment, a dog located inside the household, carpet in the bedroom, no plastic on the bed or pillow, no smoking ongoing inside the household, and she works remotely contractor work.  Objective:   Vitals:   01/11/24 1408  BP: 132/84  Pulse: 83  Temp: 98.8 F (37.1 C)  SpO2: 97%   Height: 5' 7.5 (171.5 cm) Weight: 126 lb 1.6 oz (57.2 kg)  Physical Exam Constitutional:      Appearance: She is not diaphoretic.  HENT:     Head: Normocephalic.     Right Ear: Tympanic membrane, ear canal and external ear normal.  Left Ear: Tympanic membrane, ear canal and external ear normal.     Nose: Nose normal. No mucosal edema or rhinorrhea.     Mouth/Throat:     Pharynx: Uvula midline. No oropharyngeal exudate.  Eyes:     Conjunctiva/sclera: Conjunctivae normal.  Neck:     Thyroid :  No thyromegaly.     Trachea: Trachea normal. No tracheal tenderness or tracheal deviation.  Cardiovascular:     Rate and Rhythm: Normal rate and regular rhythm.     Heart sounds: Normal heart sounds, S1 normal and S2 normal. No murmur heard. Pulmonary:     Effort: No respiratory distress.     Breath sounds: Normal breath sounds. No stridor. No wheezing or rales.  Lymphadenopathy:     Head:     Right side of head: No tonsillar adenopathy.     Left side of head: No tonsillar adenopathy.     Cervical: No cervical adenopathy.  Skin:    Findings: No erythema or rash.     Nails: There is no clubbing.  Neurological:     Mental Status: She is alert.     Diagnostics: Allergy skin tests were performed.   Spirometry was performed and demonstrated an FEV1 of *** @ *** % of predicted. FEV1/FVC = ***  The patient had an Asthma Control Test with the following results:  .     Assessment and Plan:    No diagnosis found.  There are no Patient Instructions on file for this visit.   Camellia DOROTHA Denis, MD Allergy / Immunology [place] [facility] [place] 

## 2024-01-12 ENCOUNTER — Encounter: Payer: Self-pay | Admitting: Allergy and Immunology

## 2024-01-12 NOTE — Patient Instructions (Addendum)
  1. Evaluation for overactive immune system:   A. Blood - tryptase, ANA w/ ENA reflex, metanephrines, alpha-gal panel  B. Urine - methyl histamine, LTE4, 5-HIAA, catecholamines  2.  Evaluation of recurrent syncope:   A. Blood - Autoimmune Dysautonomia Profile [id number]  3.  Evaluation for muscle dysfunction:   A. MyoMarker 3 Plus Profile (RDL) [id number], CPK, aldolase, CRP, SED  4. Treat sleep dysfunction:   A. Periactin 4 mg - 1/2 - 1 tablet at bedtime  5. Return to clinic in 4 weeks   6. Influenza = Tamiflu. Covid = Paxlovid

## 2024-01-18 ENCOUNTER — Telehealth: Payer: Self-pay | Admitting: *Deleted

## 2024-01-18 MED ORDER — CYPROHEPTADINE HCL 4 MG PO TABS: ORAL_TABLET | ORAL | 5 refills | Status: DC

## 2024-01-18 NOTE — Telephone Encounter (Signed)
 Labs and urinalysis have been ordered and requisition has been given to [facility]. I sent the patient a MyChart so that she is aware that she can come in to have labs drawn. Periactin has been sent in to the patient's pharmacy.

## 2024-01-18 NOTE — Addendum Note (Signed)
 Addended by: IVA MARTY SALTNESS on: 01/18/2024 01:27 PM   Modules accepted: Orders

## 2024-01-18 NOTE — Addendum Note (Signed)
 Addended by: FERNANDEZ-VERNON, Tomeko Scoville R on: 01/18/2024 01:28 PM   Modules accepted: Orders

## 2024-01-18 NOTE — Telephone Encounter (Signed)
-----   Message from ERIC J KOZLOW sent at 01/12/2024  7:52 AM EST ----- Scottie need the follow tests. Please arrange.   1. Evaluation for overactive immune system:   A. Blood - tryptase, ANA w/ ENA reflex, metanephrines, alpha-gal panel  B. Urine - methyl histamine, LTE4, 5-HIAA, catecholamines  2.  Evaluation of recurrent syncope:   A. Blood - Autoimmune Dysautonomia Profile [id number]  3.  Evaluation for muscle dysfunction:   A. MyoMarker 3 Plus Profile (RDL) [id number], CPK, aldolase, CRP, SED  4. Treat sleep dysfunction:   A. Periactin 4 mg - 1/2 - 1 tablet at bedtime  5. Return to clinic in 4 weeks   6. Influenza = Tamiflu. Covid = Paxlovid

## 2024-01-18 NOTE — Addendum Note (Signed)
 Addended by: FERNANDEZ-VERNON, Khianna Blazina R on: 01/18/2024 02:41 PM   Modules accepted: Orders

## 2024-01-19 ENCOUNTER — Other Ambulatory Visit: Payer: Self-pay

## 2024-01-19 ENCOUNTER — Ambulatory Visit: Payer: Self-pay | Attending: Family Medicine | Admitting: Physical Therapy

## 2024-01-19 ENCOUNTER — Encounter: Payer: Self-pay | Admitting: Physical Therapy

## 2024-01-19 DIAGNOSIS — M62838 Other muscle spasm: Secondary | ICD-10-CM | POA: Insufficient documentation

## 2024-01-19 DIAGNOSIS — M6281 Muscle weakness (generalized): Secondary | ICD-10-CM | POA: Diagnosis present

## 2024-01-19 DIAGNOSIS — R252 Cramp and spasm: Secondary | ICD-10-CM | POA: Insufficient documentation

## 2024-01-19 DIAGNOSIS — M5459 Other low back pain: Secondary | ICD-10-CM | POA: Insufficient documentation

## 2024-01-19 DIAGNOSIS — R293 Abnormal posture: Secondary | ICD-10-CM | POA: Insufficient documentation

## 2024-01-19 LAB — GENECONNECT MOLECULAR SCREEN: Genetic Analysis Overall Interpretation: NEGATIVE

## 2024-01-19 NOTE — Therapy (Signed)
 OUTPATIENT PHYSICAL THERAPY THORACOLUMBAR TREATMENT   Patient Name: [name] [name] MRN: [id number] DOB:01-13-1976, 48 y.o., female Today's Date: 01/19/2024  END OF SESSION:  PT End of Session - 01/19/24 1137     Visit Number 16    Date for Recertification  02/02/24    Authorization Type BCBS    PT Start Time 1018    PT Stop Time 1103    PT Time Calculation (min) 45 min    Activity Tolerance Patient tolerated treatment well    Behavior During Therapy [facility] for tasks assessed/performed                     Past Medical History:  Diagnosis Date   Allergy    Basal cell carcinoma    abdomen   Celiac disease 2009   Functional dyspepsia 09/05/2012   Gastritis    GERD (gastroesophageal reflux disease) 08/08/2012   Guillain Barr syndrome 04/01/2007   After influenza vaccine   Hyperthyroidism 04/01/2007   Hypoglycemia, unspecified 10/26/2007   Past Surgical History:  Procedure Laterality Date   CESAREAN SECTION     COLONOSCOPY  05/12/2007   Gessner   ESOPHAGOGASTRODUODENOSCOPY     SKIN CANCER EXCISION     abdomin   WISDOM TOOTH EXTRACTION     Patient Active Problem List   Diagnosis Date Noted   Nonsustained ventricular tachycardia (HCC) 08/24/2023   Vasovagal syncope 07/09/2023   Hashimoto's disease 07/07/2023   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   Small intestinal bacterial overgrowth 09/13/2022   Primary hypertension 06/04/2022   Preventative health care 05/07/2022   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   Fatigue 08/20/2021   Functional dyspepsia 09/05/2012   GERD (gastroesophageal reflux disease) 08/08/2012   Acne 08/08/2012   Hypoglycemia, unspecified 10/26/2007   Celiac disease 10/26/2007   Hyperthyroidism 04/01/2007   Guillain Barr syndrome 04/01/2007    PCP: Antonio Cyndee [name], DO  REFERRING PROVIDER: Antonio Cyndee [name],  DO  REFERRING DIAG: M54.9 (ICD-10-CM) - Mid back pain  Rationale for Evaluation and Treatment: Rehabilitation  THERAPY DIAG:  Other low back pain  Muscle weakness (generalized)  Abnormal posture  Cramp and spasm  Other muscle spasm  ONSET DATE: unsure specifically ~10/24/23    SUBJECTIVE:                                                                                                                                                                                           SUBJECTIVE STATEMENT: Patient reports she has been at a steady maintenance since  last session. Low back pain is steady at a 3/10. She had some upper back tightness but that responded well to heat and stretches.  PERTINENT HISTORY:  Guillain Barr syndrome, Hyperthyroidism, Hashimoto's disease, CESAREAN SECTION, Celiac disease  PAIN:  Are you having pain? Yes NPRS scale: 1-2/10 Pain location: thoracic region Pain description: intermittent and aching  Aggravating factors: certain movements Relieving factors: stretching and rest   PRECAUTIONS: None  RED FLAGS: None   WEIGHT BEARING RESTRICTIONS: No  FALLS:  Has patient fallen in last 6 months? No  LIVING ENVIRONMENT: Lives with: lives with their family   OCCUPATION: desk job, works at home   PLOF: Independent  PATIENT GOALS: to have no pain with ending work day  NEXT MD VISIT: no   OBJECTIVE:  Note: Objective measures were completed at Evaluation unless otherwise noted.  DIAGNOSTIC FINDINGS:    PATIENT SURVEYS:  Eval: Oswestry Score: 11 / 50 or 22 %  01/19/2024 ODI: 18/50 36%  COGNITION: Overall cognitive status: Within functional limits for tasks assessed     SENSATION: WFL  MUSCLE LENGTH: Bil hip flexors tight and adductors minimally   POSTURE: rounded shoulders and increased thoracic kyphosis  PALPATION: TTP and tightness noted in bil thoracic and lumbar paraspinals   LUMBAR ROM:   AROM eval  Flexion WFL  Extension  Limited by 25%  Right lateral flexion WFL  Left lateral flexion WFL  Right rotation Limited by 25%  Left rotation Limited by 25%   (Blank rows = not tested)  LOWER EXTREMITY ROM:     WFL  LOWER EXTREMITY MMT:    Bil hips grossly 4+/5   FUNCTIONAL TESTS:  Sit up test 2/3  GAIT: WFL  TREATMENT DATE:  01/19/2024: NuStep Level 5 - 8 mins- PT present to discuss status Hinge to wall x 10 RDL 5# DBS 2 x 8 Single leg deadlift (with back leg as kick stand)  Review and reordering on HEP Standing ear to hip with 5# DB x 10 each side Squat to clean with single 5# DB x 10 each hand See updated ODI score above    12/31/2023: NuStep Level 5 - 6 mins- PT present to discuss status Hooklying 6# DB overhead TA activation + march x 20 90/90 toe tap with 6# DB over head x 20 total 90/90 position + single leg SLR x 5 each Supine Bridge 2 x 10 Quadruped hip ext 2x5 bilateral (still very challenging when stability on right to stay aligned)  Bottoms up 5# KB press 2 x 8 bilateral  Seated matrix 25# 2 x 10 Standing open book with green TB x 10 each side Single leg RDL 6# 2 x 8 bilateral (max cues) Hang from lat bar    12/31/2023: NuStep Level 5 - 6 mins- PT present to discuss status Hooklying 6# DB overhead TA activation + march x 20 90/90 toe tap with 6# DB over head x 20 total 90/90 position + single leg SLR x 5 each Supine Bridge 2 x 10 Quadruped hip ext 2x5 bilateral (still very challenging when stability on right to stay aligned)  Bottoms up 5# KB press 2 x 8 bilateral  Seated matrix 25# 2 x 10 Standing open book with green TB x 10 each side Single leg RDL 6# 2 x 8 bilateral (max cues) Hang from lat bar     12/29/2023: NuStep Level 5 - 6 mins- PT present to discuss status Hamstring stretch on power plate 30Hz  2 x 30 sec  bilateral  Hip flexor/ QL stretch on power plate 30Hz  x 30 sec bilateral  Plank with thoracic rotation x 10 B (off of plinth) Bear Plank 1x30 sec,  then with alt foot lift multiple reps 90/90 toe tap with 6# DB over head x 20 total 90/90 position + single leg SLR x 5 each Bend over row (supported on plinth) 6 # DB x 10 bilateral  Bent over T (supported in plinth) 6# DB x 10 bilateral  Quadruped hip ext 2x5 bilateral (tactile cues for proper form to prevent compensations) Modified deadlift with green TB x 10 Lat Pulldown40# x 20 Hang from lat bar      12/24/2023: NuStep Level 5 - 6 mins- PT present to discuss status Modified deadlift with green TB x 10 Row with green TB under feet (patient in a bent over motion) x 10   Plank at counter + cross march 1x10 Plank with throacic rotation x 10 B (like open book), also did at mat table with feet turning as well x 10 90/90 toe tap with 4# DB x 10 Bear Plank 1x30 sec, then with alt foot lift multiple reps Lat Pulldown40# x 20 Hang from lat bar Discussed increasing endurance to walking Quadruped fire hydrant x 5 B TC and VC Quadruped hip ext 2x5 R, 1x5 L (challenging on R side)    PATIENT EDUCATION:  Education details: PENSIONS CONSULTANT Person educated: Patient Education method: Explanation, Demonstration, Tactile cues, Verbal cues, and Handouts Education comprehension: verbalized understanding, returned demonstration, verbal cues required, tactile cues required, and needs further education  HOME EXERCISE PROGRAM: Access Code: [email or web address] URL: [email or web address] Date: 01/19/2024 Prepared by: Kristeen Sar  Exercises - Cat Cow  - 1 x daily - 7 x weekly - 1 sets - 10 reps - Tail Wag  - 1 x daily - 7 x weekly - 1 sets - 10 reps - Child's Pose with Thread the Needle  - 1 x daily - 7 x weekly - 1 sets - 5 reps - 15s holds - Quadruped Full Range Thoracic Rotation with Reach  - 1 x daily - 7 x weekly - 1 sets - 10 reps - Sidelying Open Book Thoracic Lumbar Rotation and Extension  - 1 x daily - 7 x weekly - 1 sets - 10 reps - Supine narrow and wide grip flexion  - 4 x daily - 4-5  x weekly - 1-2 sets - 10 reps - 3 sec hold - Supine Single Knee to Chest Stretch  - 1 x daily - 7 x weekly - 2 sets - 20-30 hold - Supine Posterior Pelvic Tilt  - 1 x daily - 7 x weekly - 1 sets - 10 reps - Supine Hip Internal and External Rotation  - 1 x daily - 7 x weekly - 1 sets - 8 reps - Supine Ankle Pumps  - 1 x daily - 7 x weekly - 1 sets - 5 reps - Bent Knee Fallouts  - 1 x daily - 7 x weekly - 1 sets - 10 reps - Supine March  - 1 x daily - 7 x weekly - 1 sets - 10 reps - Supine 90/90 Alternating Heel Touches with Posterior Pelvic Tilt  - 1 x daily - 7 x weekly - 2 sets - 10 reps - Seated Cat Cow  - 1 x daily - 7 x weekly - 1 sets - 5-10 reps - Seated Thoracic Rotation: Open Book  - 1 x daily - 7 x weekly -  1 sets - 5 reps - Supine Shoulder Horizontal Abduction with Resistance  - 2 x daily - 4-5 x weekly - 1-2 sets - 10 reps - 3 sec hold - Supine Shoulder External Rotation with Resistance  - 2 x daily - 4-5 x weekly - 1-2 sets - 10 reps - 3 sec hold - Supine PNF D2   - 1 x daily - 4-5 x weekly - 1-2 sets - 10 reps - 3 sec hold - Standing Serratus Punch with Resistance  - 1 x daily - 3 x weekly - 2 sets - 10 reps - Standing Single Arm Shoulder Abduction with Resistance  - 1 x daily - 7 x weekly - 2 sets - 10 reps - Standing Thoracic Open Book at Wall  - 1 x daily - 7 x weekly - 1 sets - 10 reps - Low Trap Setting at Wall  - 1 x daily - 7 x weekly - 2 sets - 8-10 reps - Drawing Bow  - 1 x daily - 3 x weekly - 2 sets - 10 reps - Forearm Walks on Wall with Resistance Band  - 1 x daily - 7 x weekly - 1-2 sets - 10 reps - Prone Angels  - 1 x daily - 3 x weekly - 2 sets - 10 reps - Sidelying Shoulder External Rotation Dumbbell  - 1 x daily - 7 x weekly - 1-2 sets - 10 reps - Standing March with Alternating Med [facility]  - 1 x daily - 7 x weekly - 2 sets - 10 reps - Deadlift with Resistance  - 1 x daily - 7 x weekly - 1-2 sets - 10 reps - Standing Anti-Rotation Press with Anchored  Resistance  - 1 x daily - 7 x weekly - 1-2 sets - 12 reps - Standing Bent Over Single Arm Shoulder Row  - 1 x daily - 7 x weekly - 1-2 sets - 12 reps - Standing March with Alternating Med [facility]  - 1 x daily - 7 x weekly - 1-2 sets - 10 reps - [employer] on Counter  - 1 x daily - 7 x weekly - 2 sets - 5 reps - Reverse Crunch  - 1 x daily - 7 x weekly - 2 sets - 10 reps - Single Leg Deadlift with Kettlebell  - 1 x daily - 7 x weekly - 3 sets - 10 reps - Lat Pull Down  - 1 x daily - 3 x weekly - 2 sets - 10 reps - Seated Row Cable Machine  - 1 x daily - 7 x weekly - 2 sets - 10 reps  ASSESSMENT:  CLINICAL IMPRESSION:' Scottie presents to skilled therapy three weeks after her last appointment. She verbalized compliance with HEP and she feels she has been at a steady state. She had some upper back tightness, but it responded well to heat and stretching. Treatment session focused on reviewing hinging and ordering patient's HEP exercises in a better order for her. Educated patient on possible exercise frequency to balance HEP and pilates. Discussed POC and patient's progress and she agreed she would be ready to discharge next treatment session.     OBJECTIVE IMPAIRMENTS: decreased activity tolerance, decreased coordination, decreased endurance, decreased mobility, decreased strength, increased fascial restrictions, increased muscle spasms, impaired flexibility, improper body mechanics, postural dysfunction, and pain.   ACTIVITY LIMITATIONS: carrying, lifting, sitting, standing, and squatting  PARTICIPATION LIMITATIONS: meal prep, community activity, and occupation  PERSONAL  FACTORS: 1 comorbidity: history of back pain are also affecting patient's functional outcome.   REHAB POTENTIAL: Good  CLINICAL DECISION MAKING: Stable/uncomplicated  EVALUATION COMPLEXITY: Low   GOALS: Goals reviewed with patient? Yes  SHORT TERM GOALS: Target date: 12/01/23  Pt to be I with HEP for carry  over and continuing recommendations for improved outcomes.   Baseline: Goal status: MET 11/24/23  2.  Pt to report no more than 2/10 back pain for improved tolerance to sitting for work at least 2 hours.  Baseline:  Goal status: In progress  3.  Pt to demonstrate full ROM at thoracic spine for improved posture at midline and upright tolerance.  Baseline:  Goal status: MET 11/7   LONG TERM GOALS: Target date: 02/02/24  Pt to be I with advanced HEP for carry over and continuing recommendations for improved outcomes.   Baseline:  Goal status: INITIAL  2.  Pt to score no higher than 5/50 owestry indicating improvement in functional mobility with less to no pain.  Baseline:  Goal status: INITIAL  3.  Pt to report 0/10 back pain for improved tolerance to sitting for work at least 2 hours.   Baseline:  Goal status: INITIAL  4.  Pt to report ability to walk at least 1 hour without increased pain for improved ability to have prior level of activity and increase fitness goals. Baseline:  Goal status: IN PROGRESS 11/7    PLAN:  PT FREQUENCY: 2x/week  PT DURATION: 6 weeks  PLANNED INTERVENTIONS: 97110-Therapeutic exercises, 97530- Therapeutic activity, 97112- Neuromuscular re-education, 97535- Self Care, 02859- Manual therapy, [phone]- Canalith repositioning, [id number]- Aquatic Therapy, [phone]- Electrical stimulation (manual), [id number]- Vasopneumatic device, [id number]- Ultrasound, [id number]- Traction (mechanical), [id number]- Ionotophoresis 4mg /ml Dexamethasone, [id number] (1-2 muscles), [id number] (3+ muscles)- Dry Needling, Patient/Family education, Balance training, Stair training, Taping, Joint mobilization, Joint manipulation, Spinal manipulation, Spinal mobilization, Scar mobilization, DME instructions, Cryotherapy, Moist heat, and Biofeedback.  PLAN FOR NEXT SESSION: assess goals and answer remaining questions; discharge next session  Kristeen Sar, PT, DPT 01/19/24 11:39 AM [facility] [address] [place], [place] [address] Phone # [phone] Fax [phone]

## 2024-01-19 NOTE — Progress Notes (Signed)
 ERR

## 2024-01-19 NOTE — Addendum Note (Signed)
 Addended by: MARCINE ISAIAH CROME on: 01/19/2024 05:04 PM   Modules accepted: Orders

## 2024-01-21 DIAGNOSIS — M791 Myalgia, unspecified site: Secondary | ICD-10-CM | POA: Diagnosis not present

## 2024-01-21 DIAGNOSIS — M6281 Muscle weakness (generalized): Secondary | ICD-10-CM | POA: Diagnosis not present

## 2024-01-21 DIAGNOSIS — Z87898 Personal history of other specified conditions: Secondary | ICD-10-CM | POA: Diagnosis not present

## 2024-01-21 DIAGNOSIS — G901 Familial dysautonomia [Riley-Day]: Secondary | ICD-10-CM | POA: Diagnosis not present

## 2024-01-24 ENCOUNTER — Other Ambulatory Visit: Payer: Self-pay

## 2024-01-24 DIAGNOSIS — G901 Familial dysautonomia [Riley-Day]: Secondary | ICD-10-CM | POA: Diagnosis not present

## 2024-01-24 DIAGNOSIS — M791 Myalgia, unspecified site: Secondary | ICD-10-CM | POA: Diagnosis not present

## 2024-01-24 DIAGNOSIS — Z87898 Personal history of other specified conditions: Secondary | ICD-10-CM

## 2024-01-24 DIAGNOSIS — M6281 Muscle weakness (generalized): Secondary | ICD-10-CM | POA: Diagnosis not present

## 2024-01-26 ENCOUNTER — Ambulatory Visit: Payer: Self-pay | Admitting: Allergy & Immunology

## 2024-01-26 ENCOUNTER — Encounter: Payer: Self-pay | Admitting: Physical Therapy

## 2024-01-26 ENCOUNTER — Ambulatory Visit: Payer: Self-pay | Admitting: Physical Therapy

## 2024-01-26 DIAGNOSIS — R293 Abnormal posture: Secondary | ICD-10-CM

## 2024-01-26 DIAGNOSIS — M5459 Other low back pain: Secondary | ICD-10-CM

## 2024-01-26 DIAGNOSIS — M62838 Other muscle spasm: Secondary | ICD-10-CM

## 2024-01-26 DIAGNOSIS — R252 Cramp and spasm: Secondary | ICD-10-CM

## 2024-01-26 DIAGNOSIS — M6281 Muscle weakness (generalized): Secondary | ICD-10-CM

## 2024-01-26 LAB — 5 HIAA, QUANTITATIVE, URINE, 24 HOUR
5-HIAA, Ur: 1.5 mg/L
5-HIAA,Quant.,24 Hr Urine: 3.5 mg/(24.h) (ref 0.0–14.9)

## 2024-01-26 NOTE — Therapy (Signed)
 OUTPATIENT PHYSICAL THERAPY THORACOLUMBAR TREATMENT/ Discharge Note   Patient Name: [name] [name] MRN: [id number] DOB:1975-08-23, 48 y.o., female Today's Date: 01/26/2024  END OF SESSION:  PT End of Session - 01/26/24 1656     Visit Number 17    Date for Recertification  02/02/24    Authorization Type BCBS    PT Start Time 1617    PT Stop Time 1644    PT Time Calculation (min) 27 min    Activity Tolerance Patient tolerated treatment well    Behavior During Therapy [facility] for tasks assessed/performed                      Past Medical History:  Diagnosis Date   Allergy    Basal cell carcinoma    abdomen   Celiac disease 2009   Functional dyspepsia 09/05/2012   Gastritis    GERD (gastroesophageal reflux disease) 08/08/2012   Guillain Barr syndrome 04/01/2007   After influenza vaccine   Hyperthyroidism 04/01/2007   Hypoglycemia, unspecified 10/26/2007   Past Surgical History:  Procedure Laterality Date   CESAREAN SECTION     COLONOSCOPY  05/12/2007   [place]   ESOPHAGOGASTRODUODENOSCOPY     SKIN CANCER EXCISION     abdomin   WISDOM TOOTH EXTRACTION     Patient Active Problem List   Diagnosis Date Noted   Nonsustained ventricular tachycardia (HCC) 08/24/2023   Vasovagal syncope 07/09/2023   Hashimoto's disease 07/07/2023   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   Small intestinal bacterial overgrowth 09/13/2022   Primary hypertension 06/04/2022   Preventative health care 05/07/2022   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   Fatigue 08/20/2021   Functional dyspepsia 09/05/2012   GERD (gastroesophageal reflux disease) 08/08/2012   Acne 08/08/2012   Hypoglycemia, unspecified 10/26/2007   Celiac disease 10/26/2007   Hyperthyroidism 04/01/2007   Guillain Barr syndrome 04/01/2007    PCP: Antonio Cyndee [name], DO  REFERRING PROVIDER: Antonio Cyndee [name], DO  REFERRING DIAG: M54.9 (ICD-10-CM) - Mid back pain  Rationale for Evaluation and Treatment: Rehabilitation  THERAPY DIAG:  Other low back pain  Muscle weakness (generalized)  Abnormal posture  Cramp and spasm  Other muscle spasm  ONSET DATE: unsure specifically ~10/24/23    SUBJECTIVE:                                                                                                                                                                                           SUBJECTIVE STATEMENT: Patient reports she is doing well today.  She went to an immunologist and started a new medication and she has noticed great improvements. She feels some tightness in her upper traps.   PERTINENT HISTORY:  Guillain Barr syndrome, Hyperthyroidism, Hashimoto's disease, CESAREAN SECTION, Celiac disease  PAIN:  Are you having pain? Yes NPRS scale: 1-2/10 Pain location: thoracic region Pain description: intermittent and aching  Aggravating factors: certain movements Relieving factors: stretching and rest   PRECAUTIONS: None  RED FLAGS: None   WEIGHT BEARING RESTRICTIONS: No  FALLS:  Has patient fallen in last 6 months? No  LIVING ENVIRONMENT: Lives with: lives with their family   OCCUPATION: desk job, works at home   PLOF: Independent  PATIENT GOALS: to have no pain with ending work day  NEXT MD VISIT: no   OBJECTIVE:  Note: Objective measures were completed at Evaluation unless otherwise noted.  DIAGNOSTIC FINDINGS:    PATIENT SURVEYS:  Eval: Oswestry Score: 11 / 50 or 22 %  01/19/2024 ODI: 18/50 36%  COGNITION: Overall cognitive status: Within functional limits for tasks assessed     SENSATION: WFL  MUSCLE LENGTH: Bil hip flexors tight and adductors minimally   POSTURE: rounded shoulders and increased thoracic kyphosis  PALPATION: TTP and tightness noted in bil thoracic and lumbar paraspinals   LUMBAR ROM:   AROM eval  Flexion WFL   Extension Limited by 25%  Right lateral flexion WFL  Left lateral flexion WFL  Right rotation Limited by 25%  Left rotation Limited by 25%   (Blank rows = not tested)  LOWER EXTREMITY ROM:     WFL  LOWER EXTREMITY MMT:    Bil hips grossly 4+/5   FUNCTIONAL TESTS:  Sit up test 2/3  GAIT: WFL  TREATMENT DATE:  01/26/2024: NuStep Level 5 - 8 mins- PT present to discuss status Manual: cupping to bilateral upper traps for decreased muscle spasms and pain. Patient position in prone. Answered remaining patient questions.    01/19/2024: NuStep Level 5 - 8 mins- PT present to discuss status Hinge to wall x 10 RDL 5# DBS 2 x 8 Single leg deadlift (with back leg as kick stand)  Review and reordering on HEP Standing ear to hip with 5# DB x 10 each side Squat to clean with single 5# DB x 10 each hand See updated ODI score above    12/31/2023: NuStep Level 5 - 6 mins- PT present to discuss status Hooklying 6# DB overhead TA activation + march x 20 90/90 toe tap with 6# DB over head x 20 total 90/90 position + single leg SLR x 5 each Supine Bridge 2 x 10 Quadruped hip ext 2x5 bilateral (still very challenging when stability on right to stay aligned)  Bottoms up 5# KB press 2 x 8 bilateral  Seated matrix 25# 2 x 10 Standing open book with green TB x 10 each side Single leg RDL 6# 2 x 8 bilateral (max cues) Hang from lat bar    12/31/2023: NuStep Level 5 - 6 mins- PT present to discuss status Hooklying 6# DB overhead TA activation + march x 20 90/90 toe tap with 6# DB over head x 20 total 90/90 position + single leg SLR x 5 each Supine Bridge 2 x 10 Quadruped hip ext 2x5 bilateral (still very challenging when stability on right to stay aligned)  Bottoms up 5# KB press 2 x 8 bilateral  Seated matrix 25# 2 x 10 Standing open book with green TB x 10 each side Single leg RDL 6#  2 x 8 bilateral (max cues) Hang from lat bar     12/29/2023: NuStep Level 5 - 6 mins-  PT present to discuss status Hamstring stretch on power plate 30Hz  2 x 30 sec bilateral  Hip flexor/ QL stretch on power plate 30Hz  x 30 sec bilateral  Plank with thoracic rotation x 10 B (off of plinth) Bear Plank 1x30 sec, then with alt foot lift multiple reps 90/90 toe tap with 6# DB over head x 20 total 90/90 position + single leg SLR x 5 each Bend over row (supported on plinth) 6 # DB x 10 bilateral  Bent over T (supported in plinth) 6# DB x 10 bilateral  Quadruped hip ext 2x5 bilateral (tactile cues for proper form to prevent compensations) Modified deadlift with green TB x 10 Lat Pulldown40# x 20 Hang from lat bar     PATIENT EDUCATION:  Education details: PENSIONS CONSULTANT Person educated: Patient Education method: Programmer, Multimedia, Demonstration, Actor cues, Verbal cues, and Handouts Education comprehension: verbalized understanding, returned demonstration, verbal cues required, tactile cues required, and needs further education  HOME EXERCISE PROGRAM: Access Code: [email or web address] URL: [email or web address] Date: 01/19/2024 Prepared by: Kristeen Sar  Exercises - Cat Cow  - 1 x daily - 7 x weekly - 1 sets - 10 reps - Tail Wag  - 1 x daily - 7 x weekly - 1 sets - 10 reps - Child's Pose with Thread the Needle  - 1 x daily - 7 x weekly - 1 sets - 5 reps - 15s holds - Quadruped Full Range Thoracic Rotation with Reach  - 1 x daily - 7 x weekly - 1 sets - 10 reps - Sidelying Open Book Thoracic Lumbar Rotation and Extension  - 1 x daily - 7 x weekly - 1 sets - 10 reps - Supine narrow and wide grip flexion  - 4 x daily - 4-5 x weekly - 1-2 sets - 10 reps - 3 sec hold - Supine Single Knee to Chest Stretch  - 1 x daily - 7 x weekly - 2 sets - 20-30 hold - Supine Posterior Pelvic Tilt  - 1 x daily - 7 x weekly - 1 sets - 10 reps - Supine Hip Internal and External Rotation  - 1 x daily - 7 x weekly - 1 sets - 8 reps - Supine Ankle Pumps  - 1 x daily - 7 x weekly - 1 sets - 5 reps - Bent  Knee Fallouts  - 1 x daily - 7 x weekly - 1 sets - 10 reps - Supine March  - 1 x daily - 7 x weekly - 1 sets - 10 reps - Supine 90/90 Alternating Heel Touches with Posterior Pelvic Tilt  - 1 x daily - 7 x weekly - 2 sets - 10 reps - Seated Cat Cow  - 1 x daily - 7 x weekly - 1 sets - 5-10 reps - Seated Thoracic Rotation: Open Book  - 1 x daily - 7 x weekly - 1 sets - 5 reps - Supine Shoulder Horizontal Abduction with Resistance  - 2 x daily - 4-5 x weekly - 1-2 sets - 10 reps - 3 sec hold - Supine Shoulder External Rotation with Resistance  - 2 x daily - 4-5 x weekly - 1-2 sets - 10 reps - 3 sec hold - Supine PNF D2   - 1 x daily - 4-5 x weekly - 1-2 sets - 10 reps - 3 sec hold - Standing  Serratus Punch with Resistance  - 1 x daily - 3 x weekly - 2 sets - 10 reps - Standing Single Arm Shoulder Abduction with Resistance  - 1 x daily - 7 x weekly - 2 sets - 10 reps - Standing Thoracic Open Book at Wall  - 1 x daily - 7 x weekly - 1 sets - 10 reps - Low Trap Setting at Wall  - 1 x daily - 7 x weekly - 2 sets - 8-10 reps - Drawing Bow  - 1 x daily - 3 x weekly - 2 sets - 10 reps - Forearm Walks on Wall with Resistance Band  - 1 x daily - 7 x weekly - 1-2 sets - 10 reps - Prone Angels  - 1 x daily - 3 x weekly - 2 sets - 10 reps - Sidelying Shoulder External Rotation Dumbbell  - 1 x daily - 7 x weekly - 1-2 sets - 10 reps - Standing March with Alternating [facility]  - 1 x daily - 7 x weekly - 2 sets - 10 reps - Deadlift with Resistance  - 1 x daily - 7 x weekly - 1-2 sets - 10 reps - Standing Anti-Rotation Press with Anchored Resistance  - 1 x daily - 7 x weekly - 1-2 sets - 12 reps - Standing Bent Over Single Arm Shoulder Row  - 1 x daily - 7 x weekly - 1-2 sets - 12 reps - Standing March with Alternating [facility]  - 1 x daily - 7 x weekly - 1-2 sets - 10 reps - [employer] on Counter  - 1 x daily - 7 x weekly - 2 sets - 5 reps - Reverse Crunch  - 1 x daily - 7 x weekly - 2 sets -  10 reps - Single Leg Deadlift with Kettlebell  - 1 x daily - 7 x weekly - 3 sets - 10 reps - Lat Pull Down  - 1 x daily - 3 x weekly - 2 sets - 10 reps - Seated Row Cable Machine  - 1 x daily - 7 x weekly - 2 sets - 10 reps  ASSESSMENT:  CLINICAL IMPRESSION:' [name] [name] has made great improvements with skilled therapy. She is independent with HEP and she incorporates pilates and chi gong regularly. She felt increased muscle tension in her upper traps today, so incorporated cupping. Provided resources on where she could buy silicon cups and techniques she can use for upper traps. Majority of goals are met. Patient to discharge home with HEP.     OBJECTIVE IMPAIRMENTS: decreased activity tolerance, decreased coordination, decreased endurance, decreased mobility, decreased strength, increased fascial restrictions, increased muscle spasms, impaired flexibility, improper body mechanics, postural dysfunction, and pain.   ACTIVITY LIMITATIONS: carrying, lifting, sitting, standing, and squatting  PARTICIPATION LIMITATIONS: meal prep, community activity, and occupation  PERSONAL FACTORS: 1 comorbidity: history of back pain are also affecting patient's functional outcome.   REHAB POTENTIAL: Good  CLINICAL DECISION MAKING: Stable/uncomplicated  EVALUATION COMPLEXITY: Low   GOALS: Goals reviewed with patient? Yes  SHORT TERM GOALS: Target date: 12/01/23  Pt to be I with HEP for carry over and continuing recommendations for improved outcomes.   Baseline: Goal status: MET 11/24/23  2.  Pt to report no more than 2/10 back pain for improved tolerance to sitting for work at least 2 hours.  Baseline:  Goal status: MET   3.  Pt to demonstrate full ROM at thoracic spine  for improved posture at midline and upright tolerance.  Baseline:  Goal status: MET 11/7   LONG TERM GOALS: Target date: 02/02/24  Pt to be I with advanced HEP for carry over and continuing recommendations for improved  outcomes.   Baseline:  Goal status: MET  2.  Pt to score no higher than 5/50 owestry indicating improvement in functional mobility with less to no pain.  Baseline:  Goal status: NOT MET  3.  Pt to report 0/10 back pain for improved tolerance to sitting for work at least 2 hours.   Baseline:  Goal status: PARTIALLY MET  4.  Pt to report ability to walk at least 1 hour without increased pain for improved ability to have prior level of activity and increase fitness goals. Baseline:  Goal status: MET   PLAN:  PT FREQUENCY: 2x/week  PT DURATION: 6 weeks  PLANNED INTERVENTIONS: 97110-Therapeutic exercises, 97530- Therapeutic activity, [id number]- Neuromuscular re-education, 97535- Self Care, 02859- Manual therapy, [phone]- Canalith repositioning, [id number]- Aquatic Therapy, [id number]- Electrical stimulation (manual), [id number]- Vasopneumatic device, [id number]- Ultrasound, [id number]- Traction (mechanical), [id number]- Ionotophoresis 4mg /ml Dexamethasone, 79439 (1-2 muscles), [id number] (3+ muscles)- Dry Needling, Patient/Family education, Balance training, Stair training, Taping, Joint mobilization, Joint manipulation, Spinal manipulation, Spinal mobilization, Scar mobilization, DME instructions, Cryotherapy, Moist heat, and Biofeedback.  PLAN FOR NEXT SESSION: patient to discharge home with HEP  Kristeen Sar, PT, DPT 01/26/24 4:57 PM [facility] [address], [address] [place], [place] [address] Phone # [phone] Fax [phone]   PHYSICAL THERAPY DISCHARGE SUMMARY  Visits from Start of Care: 17  Current functional level related to goals / functional outcomes: See above   Remaining deficits: Low level back pain   Education / Equipment: See above   Patient agrees to discharge. Patient goals were partially met. Patient is being discharged due to being pleased with the current functional level.

## 2024-01-28 LAB — CATECHOLAMINES, FRACTIONATED, URINE, 24 HOUR
Dopamine , 24H Ur: 149 ug/(24.h) (ref 0–510)
Dopamine, Rand Ur: 45 ug/L
Epinephrine, 24H Ur: <10 ug/(24.h) (ref 0–20)
Epinephrine, Rand Ur: <3 ug/L
Norepinephrine, 24H Ur: <50 ug/(24.h) (ref 0–135)
Norepinephrine, Rand Ur: <15 ug/L

## 2024-01-28 LAB — LEUKOTRIENE E4, 24 HR, U
Collection Duration: 24 h
Creatinine Concentration,24 HR: 48 mg/dL
Creatinine, 24 HR, U: 1104 mg/(24.h) (ref 603–1783)
Leukotriene E4, U: 94 pg/mg{creat} (ref <=104)
Urine Volume: 2300 mL

## 2024-01-28 LAB — N-METHYLHISTAMINE, 24 HR, U
Collection Duration (h): 24 h
Creatinine Concent. 24 Hr, U: 49 mg/dL
Creatinine, 24 Hour, U: 1127 mg/(24.h) (ref 603–1783)
N-Methylhistamine, 24 Hr, U: 216 ug/g{creat} — AB (ref 30–200)
Urine Volume (mL): 2300 mL

## 2024-02-01 LAB — CATECHOLAMINES, FRACTIONATED, URINE, 24 HOUR

## 2024-02-03 ENCOUNTER — Encounter: Payer: Self-pay | Admitting: Allergy and Immunology

## 2024-02-03 ENCOUNTER — Ambulatory Visit: Admitting: Allergy and Immunology

## 2024-02-03 VITALS — BP 126/82 | HR 82 | Resp 16

## 2024-02-03 DIAGNOSIS — G4720 Circadian rhythm sleep disorder, unspecified type: Secondary | ICD-10-CM

## 2024-02-03 DIAGNOSIS — E063 Autoimmune thyroiditis: Secondary | ICD-10-CM | POA: Diagnosis not present

## 2024-02-03 DIAGNOSIS — G9089 Other disorders of autonomic nervous system: Secondary | ICD-10-CM

## 2024-02-03 NOTE — Progress Notes (Unsigned)
  - [place] - [place] - [place] - [place]   Follow-up Note  Referring Provider: Antonio Cyndee [name], DO Primary Provider: Antonio Cyndee, [name], DO Date of Office Visit: 02/03/2024  Subjective:   [name] [name] (DOB: Feb 09, 1976) is a 48 y.o. female who [facility] in re-evaluation of the following:  HPI: Scottie returns to this clinic in evaluation of syncope, possible dysautonomia, systemic pain/inflammatory syndrome, fractured sleep, allergic rhinitis, sleep dysfunction.  I last saw her in this clinic for her initial evaluation on 11 January 2024.  She is better in some ways.  Her sleep is much higher quality and not as fractured.  During the daytime she does not feel as washed out and has more stamina.  She did pass out 1 more time while urinating in the middle of the night.  She still has some muscle aches although she has not had any muscle spasms.  Allergies as of 02/03/2024       Reactions   Banana Diarrhea   Gluten Meal Other (See Comments)   Influenza Vac Split Quad    REACTION: pt had Guillane Barre--- can not have flu shot   Molds & Smuts Swelling   Effects immune system   Peanut (diagnostic) Diarrhea   Tilactase         Medication List    Claritin 10 MG Caps Generic drug: Loratadine   CORTISOL MANAGER PO Take by mouth.   cyclobenzaprine  10 MG tablet Commonly known as: FLEXERIL  Take 1 tablet (10 mg total) by mouth 3 (three) times daily as needed for muscle spasms.   cyproheptadine  4 MG tablet Commonly known as: PERIACTIN  Take 1/2 to 1 tablet at bedtime.   DIGESTIVE ENZYMES   glucosamine-chondroitin 500-400 MG tablet Take 1 tablet by mouth 3 (three) times daily.   Iron 325 (65 Fe) MG Tabs Take 65 mg by mouth daily.   losartan  50 MG tablet Commonly known as: COZAAR  Take 1 tablet (50 mg total) by mouth daily.   NON FORMULARY Take 1 tablet by mouth at bedtime. CBD   NON  FORMULARY Place under the tongue daily. Biest 50/50 to 1 tablet   PROGESTERONE  MICRONIZED (PROGESTINS)   RX SUPPORT HB/REFLUX/ALOE PO Take by mouth.   UP4 PROBIOTICS PO    Past Medical History:  Diagnosis Date   Allergy    Basal cell carcinoma    abdomen   Celiac disease 2009   Functional dyspepsia 09/05/2012   Gastritis    GERD (gastroesophageal reflux disease) 08/08/2012   Guillain Barr syndrome 04/01/2007   After influenza vaccine   Hyperthyroidism 04/01/2007   Hypoglycemia, unspecified 10/26/2007    Past Surgical History:  Procedure Laterality Date   CESAREAN SECTION     COLONOSCOPY  05/12/2007   [place]   ESOPHAGOGASTRODUODENOSCOPY     SKIN CANCER EXCISION     abdomin   WISDOM TOOTH EXTRACTION      Review of systems negative except as noted in HPI / PMHx or noted below:  Review of Systems  Constitutional: Negative.   HENT: Negative.    Eyes: Negative.   Respiratory: Negative.    Cardiovascular: Negative.   Gastrointestinal: Negative.   Genitourinary: Negative.   Musculoskeletal: Negative.   Skin: Negative.   Neurological: Negative.   Endo/Heme/Allergies: Negative.   Psychiatric/Behavioral: Negative.       Objective:   Vitals:   02/03/24 1624  BP: 126/82  Pulse: 82  Resp: 16  SpO2: 98%          Physical Exam-deferred  Diagnostics:    Assessment and Plan:   1. Hashimoto's thyroiditis   2. Dysautonomia-like disorder   3. Dysfunction of sleep stage or arousal     Patient Instructions   1. Use periactin  4 - 6 - 8 mg at bedtime  2. In 5 months repeat blood test - Ganglionic Acetylcholine Receptor Autoantibody (914029), thyroid  peroxidase antibody, TSH FT4  3. Influenza = Tamiflu. Covid = Paxlovid  4. Return to clinic in 6 months.    Camellia Denis, MD Allergy / Immunology [facility]

## 2024-02-03 NOTE — Patient Instructions (Addendum)
°  1. Use periactin  4 - 6 - 8 mg at bedtime  2. In 5 months repeat blood test - Ganglionic Acetylcholine Receptor Autoantibody (914029), thyroid  peroxidase antibody, TSH FT4  3. Influenza = Tamiflu. Covid = Paxlovid  4. Return to clinic in 6 months.

## 2024-02-07 ENCOUNTER — Encounter: Payer: Self-pay | Admitting: Allergy and Immunology

## 2024-02-15 LAB — MYOMARKER 3 PLUS PROFILE (RDL)
Anti-EJ Ab (RDL): NEGATIVE
Anti-Jo-1 Ab (RDL): <20 U
Anti-Ku Ab (RDL): NEGATIVE
Anti-MDA-5 Ab (CADM-140)(RDL): <20 U
Anti-Mi-2 Ab (RDL): NEGATIVE
Anti-NXP-2 (P140) Ab (RDL): <20 U
Anti-OJ Ab (RDL): NEGATIVE
Anti-PL-12 Ab (RDL: NEGATIVE
Anti-PL-7 Ab (RDL): NEGATIVE
Anti-PM/Scl-100 Ab (RDL): <20 U
Anti-SAE1 Ab, IgG (RDL): <20 U
Anti-SRP Ab (RDL): NEGATIVE
Anti-SS-A 52kD Ab, IgG (RDL): <20 U
Anti-TIF-1gamma Ab (RDL): <20 U
Anti-U1 RNP Ab (RDL): <20 U
Anti-U2 RNP Ab (RDL): NEGATIVE
Anti-U3 RNP (Fibrillarin)(RDL): NEGATIVE

## 2024-02-15 LAB — AUTOIMMUNE DYSAUTONOMIA
AQP4 Antibody, Cell-based IFA: NEGATIVE
Anti-Hu Ab: NEGATIVE
Anti-Ri Ab: NEGATIVE
CASPR2 Antibody,Cell-based IFA: NEGATIVE
CRMP-5 IgG: NEGATIVE
DPPX Antibody: NEGATIVE
Ganglionic AChRab, Serum: 0.09 nmol/L — ABNORMAL HIGH (ref 0.000–0.066)
Interpretation: NEGATIVE
LGI1 Antibody, Cell-based IFA: NEGATIVE
Purkinje Cell Cyto Ab Type 2: NEGATIVE

## 2024-02-15 LAB — ALPHA-GAL PANEL
Allergen Lamb IgE: <0.1 kU/L
Beef IgE: <0.1 kU/L
IgE (Immunoglobulin E), Serum: 38 [IU]/mL (ref 6–495)
O215-IgE Alpha-Gal: <0.1 kU/L
Pork IgE: <0.1 kU/L

## 2024-02-15 LAB — METANEPHRINES, PLASMA
Metanephrine, Free: <25 pg/mL (ref 0.0–88.0)
Normetanephrine, Free: 58.9 pg/mL (ref 0.0–218.9)

## 2024-02-15 LAB — ANTINUCLEAR ANTIBODIES, IFA: ANA Titer 1: NEGATIVE

## 2024-02-15 LAB — SEDIMENTATION RATE: Sed Rate: 7 mm/h (ref 0–32)

## 2024-02-15 LAB — C-REACTIVE PROTEIN: CRP: <1 mg/L (ref 0–10)

## 2024-02-15 LAB — CK: Total CK: 94 U/L (ref 32–182)

## 2024-02-15 LAB — ALDOLASE: Aldolase: 3.7 U/L (ref 3.3–10.3)

## 2024-02-15 LAB — TRYPTASE: Tryptase: 1.1 ug/L — ABNORMAL LOW (ref 2.2–13.2)

## 2024-02-20 ENCOUNTER — Other Ambulatory Visit: Payer: Self-pay | Admitting: Family Medicine

## 2024-02-20 DIAGNOSIS — I10 Essential (primary) hypertension: Secondary | ICD-10-CM

## 2024-02-25 ENCOUNTER — Telehealth: Payer: Self-pay

## 2024-02-25 NOTE — Telephone Encounter (Signed)
 Copied from CRM [id number]. Topic: Clinical - Medical Advice >> Feb 25, 2024 10:09 AM Alfonso ORN wrote: Reason for CRM: pt would like to know what diagnosis, symptoms were addressed during her last physical with pcp as she has had new things arise and wants to know what to add for her next physical. Please advise

## 2024-02-25 NOTE — Telephone Encounter (Signed)
 Spoke w/ Pt- informed Pt how she can view this information via her Mychart. Pt very thankful

## 2024-03-08 DIAGNOSIS — T7840XA Allergy, unspecified, initial encounter: Secondary | ICD-10-CM | POA: Insufficient documentation

## 2024-03-08 DIAGNOSIS — K2970 Gastritis, unspecified, without bleeding: Secondary | ICD-10-CM | POA: Insufficient documentation

## 2024-03-08 DIAGNOSIS — C4491 Basal cell carcinoma of skin, unspecified: Secondary | ICD-10-CM | POA: Insufficient documentation

## 2024-03-09 ENCOUNTER — Ambulatory Visit: Admitting: Cardiology

## 2024-03-09 ENCOUNTER — Other Ambulatory Visit: Payer: Self-pay | Admitting: *Deleted

## 2024-03-09 ENCOUNTER — Encounter: Payer: Self-pay | Admitting: Cardiology

## 2024-03-09 VITALS — BP 110/70 | HR 94 | Ht 67.5 in | Wt 128.0 lb

## 2024-03-09 DIAGNOSIS — R55 Syncope and collapse: Secondary | ICD-10-CM | POA: Diagnosis not present

## 2024-03-09 DIAGNOSIS — I4729 Other ventricular tachycardia: Secondary | ICD-10-CM | POA: Diagnosis not present

## 2024-03-09 DIAGNOSIS — I10 Essential (primary) hypertension: Secondary | ICD-10-CM | POA: Diagnosis not present

## 2024-03-09 NOTE — Progress Notes (Signed)
 " Cardiology Office Note:    Date:  03/09/2024   ID:  [name] [name], DOB [birth year], MRN [id number]  PCP:  Antonio Meth, Jamee SAUNDERS, DO  Cardiologist:  [name] Fitch, MD    Referring MD: Antonio Meth, Jamee SAUNDERS, DO   Chief Complaint  Patient presents with   Follow-up  Doing a little better  History of Present Illness:     [name] [name] is a 49 y.o. female past medical history significant for what appears to be vasovagal syncope, hypothyroidism, nonsustained ventricular tachycardia, workup for this included echocardiogram showed preserved left ventricular ejection fraction, stress test with excellent capacity being negative overall is a low risk scenario.  She comes today to follow-up on what appears to be autonomic instability.  She is doing [place] better she was seen by Dr. Keenan and I appreciate his expertise.  She was given some medication seems to be sleeping a little better overall feeling better denies have any recent episode of syncope last episode of syncope happened at night when she went to the restroom to urinate.  She started exercising.  And enjoying it.  She was in physical therapy for some chronic back condition  Past Medical History:  Diagnosis Date   Acne 08/08/2012   Allergy    Basal cell carcinoma    abdomen   Celiac disease 2009   Fatigue 08/20/2021   Functional dyspepsia 09/05/2012   Gastritis    GERD (gastroesophageal reflux disease) 08/08/2012   Guillain Barr syndrome 04/01/2007   After influenza vaccine   Hashimoto's disease 07/07/2023   Hyperthyroidism 04/01/2007   Hypoglycemia, unspecified 10/26/2007   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   Nonsustained ventricular tachycardia (HCC) 08/24/2023   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   Preventative health care 05/07/2022   Primary hypertension 06/04/2022   Small intestinal bacterial overgrowth 09/13/2022   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   Vasovagal syncope 07/09/2023    Past Surgical History:  Procedure Laterality Date   CESAREAN SECTION     COLONOSCOPY  05/12/2007   [place]   ESOPHAGOGASTRODUODENOSCOPY     SKIN CANCER EXCISION     abdomin   WISDOM TOOTH EXTRACTION      Current Medications: Active Medications[1]   Allergies:   Banana, Gluten meal, Influenza vac split quad, Molds & smuts, Peanut (diagnostic), and Tilactase   Social History   Socioeconomic History   Marital status: Married    Spouse name: [name]   Number of children: 2   Years of education: 16   Highest education level: Bachelor's degree (e.g., BA, AB, BS)  Occupational History   Occupation: Social Worker: [employer]  Tobacco Use   Smoking status: Former    Current packs/day: 0.00    Types: Cigarettes    Quit date: 01/19/1998    Years since quitting: 26.1   Smokeless tobacco: Never   Tobacco comments:    About age 48   Vaping Use   Vaping status: Never Used  Substance and Sexual Activity   Alcohol use: Yes    Alcohol/week: 4.0 - 8.0 standard drinks of alcohol    Types: 4 - 8 Glasses of wine per week   Drug use: Yes    Comment: CBD   Sexual activity: Yes    Partners: Male  Other Topics Concern   Not on file  Social History Narrative   Married with 2 children   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 [place]   Occasional alcohol no drugs or tobacco use at this time   Exercise-- 2 x a week    R handed   Caffeine: a cup of tea and Coffee a day   Social Drivers of Health   Tobacco Use: Medium Risk (03/09/2024)   Patient History    Smoking Tobacco Use: Former    Smokeless Tobacco Use: Never    Passive Exposure: Not on file  Financial Resource Strain: Low Risk (05/17/2023)   Overall Financial Resource Strain (CARDIA)    Difficulty of Paying Living Expenses: Not hard at all  Food Insecurity: No Food Insecurity (05/17/2023)   Hunger Vital Sign    Worried About Running  Out of Food in the Last Year: Never true    Ran Out of Food in the Last Year: Never true  Transportation Needs: No Transportation Needs (05/17/2023)   PRAPARE - Administrator, Civil Service (Medical): No    Lack of Transportation (Non-Medical): No  Physical Activity: Sufficiently Active (05/17/2023)   Exercise Vital Sign    Days of Exercise per Week: 5 days    Minutes of Exercise per Session: 40 min  Stress: Stress Concern Present (05/17/2023)   [employer] of Occupational Health - Occupational Stress Questionnaire    Feeling of Stress : Very much  Social Connections: Moderately Isolated (05/17/2023)   Social Connection and Isolation Panel    Frequency of Communication with Friends and Family: More than three times a week    Frequency of Social Gatherings with Friends and Family: Once a week    Attends Religious Services: Never    Database Administrator or Organizations: No    Attends Engineer, Structural: Not on file    Marital Status: Married  Depression (PHQ2-9): Medium Risk (06/03/2022)   Depression (PHQ2-9)    PHQ-2 Score: 6  Alcohol Screen: Low Risk (05/17/2023)   Alcohol Screen    Last Alcohol Screening Score (AUDIT): 3  Housing: Low Risk (05/17/2023)   Housing Stability Vital Sign    Unable to Pay for Housing in the Last Year: No    Number of Times Moved in the Last Year: 0    Homeless in the Last Year: No  Utilities: Low Risk (11/20/2022)   Received from [facility]   Utilities    In the past 12 months has the electric, gas, oil, or water company threatened to shut off services in your home? : No  Health Literacy: Not on file     Family History: The patient's family history includes Autoimmune disease in an other family member; Bipolar disorder in her sister; Depression in her father; Diabetes in her father; Heart attack in her father; Hyperlipidemia in her father; Hypertension in her father, mother, sister, and another family member; Leukemia  (age of onset: 12) in her mother; Lupus in her cousin; Memory loss in her mother; Multiple sclerosis in her cousin; Prostate cancer (age of onset: 70) in her father; Stroke in her maternal grandmother and mother. There is no history of Colon cancer, Esophageal cancer, Stomach cancer, Rectal cancer, or Colon polyps. ROS:   Please see the history of present illness.    All 14 point review of systems negative except as described per history of present illness  EKGs/Labs/Other Studies Reviewed:         Recent Labs: 05/18/2023: ALT 25; BUN 10; Creatinine, Ser 0.76; Hemoglobin 14.4; Platelets 304.0; Potassium 4.0; Sodium 141 09/29/2023: TSH 1.40  Recent Lipid Panel    Component Value Date/Time   CHOL 191 05/18/2023 1039   TRIG 63.0 05/18/2023 1039   HDL 85.40 05/18/2023 1039   CHOLHDL 2 05/18/2023 1039   VLDL 12.6 05/18/2023 1039   LDLCALC 93 05/18/2023 1039    Physical Exam:    VS:  BP 110/70   Pulse 94   Ht 5' 7.5 (1.715 m)   Wt 128 lb (58.1 kg)   LMP 02/24/2019   SpO2 99%   BMI 19.75 kg/m     Wt Readings from Last 3 Encounters:  03/09/24 128 lb (58.1 kg)  01/11/24 126 lb 1.6 oz (57.2 kg)  12/07/23 127 lb (57.6 kg)     GEN:  Well nourished, well developed in no acute distress HEENT: Normal NECK: No JVD; No carotid bruits LYMPHATICS: No lymphadenopathy CARDIAC: RRR, no murmurs, no rubs, no gallops RESPIRATORY:  Clear to auscultation without rales, wheezing or rhonchi  ABDOMEN: Soft, non-tender, non-distended MUSCULOSKELETAL:  No edema; No deformity  SKIN: Warm and dry LOWER EXTREMITIES: no swelling NEUROLOGIC:  Alert and oriented x 3 PSYCHIATRIC:  Normal affect   ASSESSMENT:    1. Vasovagal syncope   2. Nonsustained ventricular tachycardia (HCC)   3. Primary hypertension    PLAN:    In order of problems listed above:  Vasovagal syncope.  Plenty of fluids liberation of salt seems to be doing well denies having any recent syncope.  I 1 more time I described the  mechanism and symptoms of it and asked her to protect herself if she is feeling the sensation coming.  Again plenty of fluids if that will not be enough we will consider elastic stockings if that is not enough we may even consider some midodrine but for now I think fluids and doing celebration will be sufficient.  I will make referral however to autonomic clinic a at [facility]. History of nonsustained ventricular tachycardia, normal sinus rhythm, no dizziness no passing out that would indicate arrhythmic passing out, echocardiogram normal stress test negative low risk scenario. Essential hypertension blood pressure well-controlled actually on the lower side with losartan .  In the future if syncope will be still problematic and vasovagal will reduce the dose of the medication   Medication Adjustments/Labs and Tests Ordered: Current medicines are reviewed at length with the patient today.  Concerns regarding medicines are outlined above.  No orders of the defined types were placed in this encounter.  Medication changes: No orders of the defined types were placed in this encounter.   Signed, [name] DOROTHA Fitch, MD, [facility] 03/09/2024 1:21 PM    [facility]    [1]  Current Meds  Medication Sig   cyclobenzaprine  (FLEXERIL ) 10 MG tablet Take 1 tablet (10 mg total) by mouth 3 (three) times daily as needed for muscle spasms.   cyproheptadine  (PERIACTIN ) 4 MG tablet Take 1/2 to 1 tablet at bedtime.   DIGESTIVE ENZYMES    FA-Cyanocobalamin -B6-D-Ca-Aloe (RX SUPPORT HB/REFLUX/ALOE PO) Take by mouth.   Ferrous Sulfate (IRON) 325 (65 FE) MG TABS Take 65 mg by mouth daily.   glucosamine-chondroitin 500-400 MG tablet Take 1 tablet by mouth 3 (three) times daily.   Loratadine (CLARITIN) 10 MG CAPS    losartan  (COZAAR ) 50 MG tablet TAKE 1 TABLET BY MOUTH EVERY DAY   NON FORMULARY Take 1 tablet by mouth at bedtime. CBD   NON FORMULARY Place under the tongue daily. Biest 50/50 to 1 tablet    Probiotic Product (UP4 PROBIOTICS  PO)    PROGESTERONE  MICRONIZED, PROGESTINS,    "

## 2024-03-09 NOTE — Patient Instructions (Signed)
 Medication Instructions:  Your physician recommends that you continue on your current medications as directed. Please refer to the Current Medication list given to you today.  *If you need a refill on your cardiac medications before your next appointment, please call your pharmacy*   Lab Work: None Ordered If you have labs (blood work) drawn today and your tests are completely normal, you will receive your results only by: MyChart Message (if you have MyChart) OR A paper copy in the mail If you have any lab test that is abnormal or we need to change your treatment, we will call you to review the results.   Testing/Procedures: None Ordered   Follow-Up: At [facility], you and your health needs are our priority.  As part of our continuing mission to provide you with exceptional heart care, we have created designated Provider Care Teams.  These Care Teams include your primary Cardiologist (physician) and Advanced Practice Providers (APPs -  Physician Assistants and Nurse Practitioners) who all work together to provide you with the care you need, when you need it.  We recommend signing up for the patient portal called MyChart.  Sign up information is provided on this After Visit Summary.  MyChart is used to connect with patients for Virtual Visits (Telemedicine).  Patients are able to view lab/test results, encounter notes, upcoming appointments, etc.  Non-urgent messages can be sent to your provider as well.   To learn more about what you can do with MyChart, go to [email or web address].    Your next appointment:   5 month(s)  The format for your next appointment:   In Person  Provider:   [name] Fitch, MD    Other Instructions NA

## 2024-03-10 ENCOUNTER — Telehealth: Payer: Self-pay

## 2024-03-10 NOTE — Telephone Encounter (Signed)
 Faxed referral to [facility] referral center for [facility]

## 2024-03-14 ENCOUNTER — Encounter: Payer: Self-pay | Admitting: Allergy and Immunology

## 2024-03-15 ENCOUNTER — Other Ambulatory Visit: Payer: Self-pay

## 2024-03-15 DIAGNOSIS — G4720 Circadian rhythm sleep disorder, unspecified type: Secondary | ICD-10-CM

## 2024-03-15 MED ORDER — CYPROHEPTADINE HCL 4 MG PO TABS: ORAL_TABLET | ORAL | 3 refills | Status: DC

## 2024-03-21 ENCOUNTER — Other Ambulatory Visit: Payer: Self-pay

## 2024-03-21 DIAGNOSIS — G4720 Circadian rhythm sleep disorder, unspecified type: Secondary | ICD-10-CM

## 2024-03-21 MED ORDER — CYPROHEPTADINE HCL 4 MG PO TABS: ORAL_TABLET | ORAL | 3 refills | Status: AC

## 2024-03-24 ENCOUNTER — Telehealth: Payer: Self-pay

## 2024-03-24 NOTE — Telephone Encounter (Signed)
 Pt is calling back because she has not heard from [facility] to schedule an appt

## 2024-03-24 NOTE — Telephone Encounter (Signed)
 [facility] referral resent to number provided by patient

## 2024-05-23 ENCOUNTER — Encounter: Admitting: Family Medicine

## 2024-05-29 ENCOUNTER — Encounter: Admitting: Family Medicine

## 2024-08-01 ENCOUNTER — Ambulatory Visit: Admitting: Allergy and Immunology

## 2024-08-09 ENCOUNTER — Ambulatory Visit: Admitting: Cardiology
